# Patient Record
Sex: Male | Born: 1955 | Race: Black or African American | Hispanic: No | Marital: Married | Smoking: Never smoker
Health system: Southern US, Community
[De-identification: ages and names within clinical notes are randomized; demographics above are authoritative.]

## PROBLEM LIST (undated history)

## (undated) DIAGNOSIS — E039 Hypothyroidism, unspecified: Secondary | ICD-10-CM

## (undated) DIAGNOSIS — E2740 Unspecified adrenocortical insufficiency: Secondary | ICD-10-CM

## (undated) DIAGNOSIS — I509 Heart failure, unspecified: Secondary | ICD-10-CM

## (undated) DIAGNOSIS — I10 Essential (primary) hypertension: Secondary | ICD-10-CM

## (undated) DIAGNOSIS — E871 Hypo-osmolality and hyponatremia: Secondary | ICD-10-CM

## (undated) DIAGNOSIS — M25519 Pain in unspecified shoulder: Secondary | ICD-10-CM

## (undated) HISTORY — DX: Pain in unspecified shoulder: M25.519

## (undated) HISTORY — DX: Heart failure, unspecified: I50.9

## (undated) HISTORY — DX: Hypo-osmolality and hyponatremia: E87.1

## (undated) HISTORY — PX: HERNIA REPAIR: SHX51

## (undated) HISTORY — PX: NO PAST SURGERIES: SHX2092

---

## 2003-01-30 HISTORY — PX: HERNIA REPAIR: SHX51

## 2019-09-03 ENCOUNTER — Inpatient Hospital Stay (HOSPITAL_COMMUNITY): Payer: Medicaid Other

## 2019-09-03 ENCOUNTER — Encounter (HOSPITAL_COMMUNITY): Payer: Self-pay | Admitting: *Deleted

## 2019-09-03 ENCOUNTER — Inpatient Hospital Stay (HOSPITAL_COMMUNITY)
Admission: EM | Admit: 2019-09-03 | Discharge: 2019-09-09 | DRG: 291 | Disposition: A | Discharge status: Home / Self Care | Payer: Medicaid Other | Attending: Student in an Organized Health Care Education/Training Program | Admitting: Student in an Organized Health Care Education/Training Program

## 2019-09-03 DIAGNOSIS — R0602 Shortness of breath: Secondary | ICD-10-CM

## 2019-09-03 DIAGNOSIS — Z20822 Contact with and (suspected) exposure to covid-19: Secondary | ICD-10-CM | POA: Diagnosis present

## 2019-09-03 DIAGNOSIS — Z6822 Body mass index (BMI) 22.0-22.9, adult: Secondary | ICD-10-CM | POA: Diagnosis not present

## 2019-09-03 DIAGNOSIS — E878 Other disorders of electrolyte and fluid balance, not elsewhere classified: Secondary | ICD-10-CM | POA: Diagnosis present

## 2019-09-03 DIAGNOSIS — Z79899 Other long term (current) drug therapy: Secondary | ICD-10-CM | POA: Diagnosis not present

## 2019-09-03 DIAGNOSIS — R601 Generalized edema: Secondary | ICD-10-CM | POA: Diagnosis not present

## 2019-09-03 DIAGNOSIS — R634 Abnormal weight loss: Secondary | ICD-10-CM | POA: Diagnosis present

## 2019-09-03 DIAGNOSIS — E778 Other disorders of glycoprotein metabolism: Secondary | ICD-10-CM | POA: Diagnosis present

## 2019-09-03 DIAGNOSIS — E8770 Fluid overload, unspecified: Secondary | ICD-10-CM | POA: Diagnosis present

## 2019-09-03 DIAGNOSIS — M79605 Pain in left leg: Secondary | ICD-10-CM | POA: Diagnosis not present

## 2019-09-03 DIAGNOSIS — E039 Hypothyroidism, unspecified: Secondary | ICD-10-CM | POA: Diagnosis not present

## 2019-09-03 DIAGNOSIS — I361 Nonrheumatic tricuspid (valve) insufficiency: Secondary | ICD-10-CM | POA: Diagnosis not present

## 2019-09-03 DIAGNOSIS — E871 Hypo-osmolality and hyponatremia: Secondary | ICD-10-CM | POA: Diagnosis present

## 2019-09-03 DIAGNOSIS — R001 Bradycardia, unspecified: Secondary | ICD-10-CM | POA: Diagnosis present

## 2019-09-03 DIAGNOSIS — I445 Left posterior fascicular block: Secondary | ICD-10-CM | POA: Diagnosis present

## 2019-09-03 DIAGNOSIS — Z8619 Personal history of other infectious and parasitic diseases: Secondary | ICD-10-CM

## 2019-09-03 DIAGNOSIS — D649 Anemia, unspecified: Secondary | ICD-10-CM | POA: Diagnosis not present

## 2019-09-03 DIAGNOSIS — N5089 Other specified disorders of the male genital organs: Secondary | ICD-10-CM

## 2019-09-03 DIAGNOSIS — E440 Moderate protein-calorie malnutrition: Secondary | ICD-10-CM | POA: Diagnosis present

## 2019-09-03 DIAGNOSIS — E8809 Other disorders of plasma-protein metabolism, not elsewhere classified: Secondary | ICD-10-CM | POA: Diagnosis present

## 2019-09-03 DIAGNOSIS — I5021 Acute systolic (congestive) heart failure: Secondary | ICD-10-CM | POA: Diagnosis present

## 2019-09-03 DIAGNOSIS — N433 Hydrocele, unspecified: Secondary | ICD-10-CM | POA: Diagnosis present

## 2019-09-03 DIAGNOSIS — E038 Other specified hypothyroidism: Secondary | ICD-10-CM | POA: Diagnosis present

## 2019-09-03 DIAGNOSIS — I110 Hypertensive heart disease with heart failure: Secondary | ICD-10-CM | POA: Diagnosis present

## 2019-09-03 DIAGNOSIS — D509 Iron deficiency anemia, unspecified: Secondary | ICD-10-CM | POA: Diagnosis present

## 2019-09-03 DIAGNOSIS — D721 Eosinophilia, unspecified: Secondary | ICD-10-CM | POA: Diagnosis not present

## 2019-09-03 DIAGNOSIS — B181 Chronic viral hepatitis B without delta-agent: Secondary | ICD-10-CM | POA: Diagnosis present

## 2019-09-03 DIAGNOSIS — M6284 Sarcopenia: Secondary | ICD-10-CM | POA: Diagnosis not present

## 2019-09-03 DIAGNOSIS — R609 Edema, unspecified: Secondary | ICD-10-CM

## 2019-09-03 DIAGNOSIS — R531 Weakness: Secondary | ICD-10-CM | POA: Diagnosis not present

## 2019-09-03 HISTORY — DX: Essential (primary) hypertension: I10

## 2019-09-03 HISTORY — DX: Hypothyroidism, unspecified: E03.9

## 2019-09-03 LAB — T4, FREE: Free T4: 0.91 ng/dL (ref 0.61–1.12)

## 2019-09-03 LAB — BASIC METABOLIC PANEL
Anion gap: 8 (ref 5–15)
Anion gap: 6 (ref 5–15)
BUN: <5 mg/dL — ABNORMAL LOW (ref 8–23)
BUN: 5 mg/dL — ABNORMAL LOW (ref 8–23)
CO2: 22 mmol/L (ref 22–32)
CO2: 21 mmol/L — ABNORMAL LOW (ref 22–32)
Calcium: 7.8 mg/dL — ABNORMAL LOW (ref 8.9–10.3)
Calcium: 7.1 mg/dL — ABNORMAL LOW (ref 8.9–10.3)
Chloride: 87 mmol/L — ABNORMAL LOW (ref 98–111)
Chloride: 90 mmol/L — ABNORMAL LOW (ref 98–111)
Creatinine, Ser: 1.06 mg/dL (ref 0.61–1.24)
Creatinine, Ser: 0.98 mg/dL (ref 0.61–1.24)
GFR calc Af Amer: >60 mL/min (ref >60)
GFR calc Af Amer: >60 mL/min (ref >60)
GFR calc non Af Amer: >60 mL/min (ref >60)
GFR calc non Af Amer: >60 mL/min (ref >60)
Glucose, Bld: 79 mg/dL (ref 70–99)
Glucose, Bld: 81 mg/dL (ref 70–99)
Potassium: 3.7 mmol/L (ref 3.5–5.1)
Potassium: 5.4 mmol/L — ABNORMAL HIGH (ref 3.5–5.1)
Sodium: 117 mmol/L — CL (ref 135–145)
Sodium: 117 mmol/L — CL (ref 135–145)

## 2019-09-03 LAB — CK: Total CK: 250 U/L (ref 49–397)

## 2019-09-03 LAB — CBC
HCT: 32.9 % — ABNORMAL LOW (ref 39.0–52.0)
Hemoglobin: 12 g/dL — ABNORMAL LOW (ref 13.0–17.0)
MCH: 25.8 pg — ABNORMAL LOW (ref 26.0–34.0)
MCHC: 36.5 g/dL — ABNORMAL HIGH (ref 30.0–36.0)
MCV: 70.6 fL — ABNORMAL LOW (ref 80.0–100.0)
Platelets: 194 10*3/uL (ref 150–400)
RBC: 4.66 MIL/uL (ref 4.22–5.81)
RDW: 15.8 % — ABNORMAL HIGH (ref 11.5–15.5)
WBC: 4.6 10*3/uL (ref 4.0–10.5)
nRBC: 0 % (ref 0.0–0.2)

## 2019-09-03 LAB — HEPATIC FUNCTION PANEL
ALT: 15 U/L (ref 0–44)
AST: 31 U/L (ref 15–41)
Albumin: 2 g/dL — ABNORMAL LOW (ref 3.5–5.0)
Alkaline Phosphatase: 72 U/L (ref 38–126)
Bilirubin, Direct: 0.3 mg/dL — ABNORMAL HIGH (ref 0.0–0.2)
Indirect Bilirubin: 0.4 mg/dL (ref 0.3–0.9)
Total Bilirubin: 0.7 mg/dL (ref 0.3–1.2)
Total Protein: 5.3 g/dL — ABNORMAL LOW (ref 6.5–8.1)

## 2019-09-03 LAB — URINALYSIS, ROUTINE W REFLEX MICROSCOPIC
Bilirubin Urine: NEGATIVE
Glucose, UA: NEGATIVE mg/dL
Hgb urine dipstick: NEGATIVE
Ketones, ur: NEGATIVE mg/dL
Leukocytes,Ua: NEGATIVE
Nitrite: NEGATIVE
Protein, ur: NEGATIVE mg/dL
Specific Gravity, Urine: 1.012 (ref 1.005–1.030)
pH: 6 (ref 5.0–8.0)

## 2019-09-03 LAB — PROTEIN / CREATININE RATIO, URINE
Creatinine, Urine: 130.95 mg/dL
Protein Creatinine Ratio: 0.11 mg/mg{Cre} (ref 0.00–0.15)
Total Protein, Urine: 14 mg/dL

## 2019-09-03 LAB — MAGNESIUM: Magnesium: 2 mg/dL (ref 1.7–2.4)

## 2019-09-03 LAB — SODIUM, URINE, RANDOM: Sodium, Ur: 26 mmol/L

## 2019-09-03 LAB — HEMOGLOBIN A1C
Hgb A1c MFr Bld: 4.9 % (ref 4.8–5.6)
Mean Plasma Glucose: 93.93 mg/dL

## 2019-09-03 LAB — TSH: TSH: 9.412 u[IU]/mL — ABNORMAL HIGH (ref 0.350–4.500)

## 2019-09-03 LAB — HIV ANTIBODY (ROUTINE TESTING W REFLEX): HIV Screen 4th Generation wRfx: NONREACTIVE

## 2019-09-03 LAB — OSMOLALITY, URINE: Osmolality, Ur: 284 mOsm/kg — ABNORMAL LOW (ref 300–900)

## 2019-09-03 LAB — LIPASE, BLOOD: Lipase: 16 U/L (ref 11–51)

## 2019-09-03 LAB — OSMOLALITY: Osmolality: 245 mOsm/kg — CL (ref 275–295)

## 2019-09-03 LAB — TROPONIN I (HIGH SENSITIVITY): Troponin I (High Sensitivity): 3 ng/L (ref <18)

## 2019-09-03 LAB — PROTIME-INR
INR: 1.4 — ABNORMAL HIGH (ref 0.8–1.2)
Prothrombin Time: 16.5 seconds — ABNORMAL HIGH (ref 11.4–15.2)

## 2019-09-03 LAB — SARS CORONAVIRUS 2 BY RT PCR (HOSPITAL ORDER, PERFORMED IN ~~LOC~~ HOSPITAL LAB): SARS Coronavirus 2: NEGATIVE

## 2019-09-03 IMAGING — US US RENAL
1 series · 14 of 25 positions shown · non-contrast
Comparison: None.

CLINICAL DATA: Anasarca.

EXAM:
RENAL / URINARY TRACT ULTRASOUND COMPLETE

[Series 1: us renal · 14 of 37 slices shown]
[im 1/37]
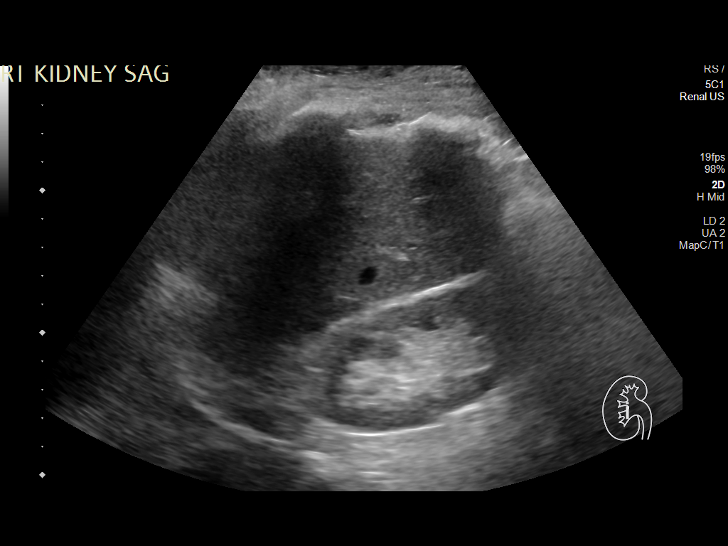
[im 4/37]
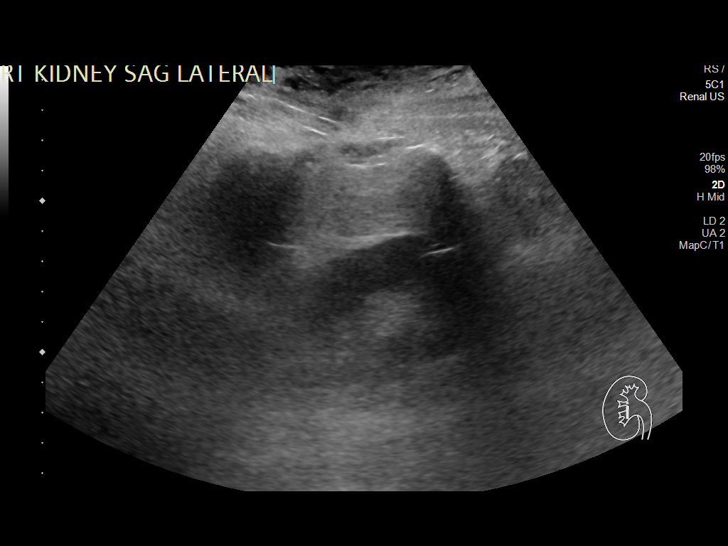
[im 7/37]
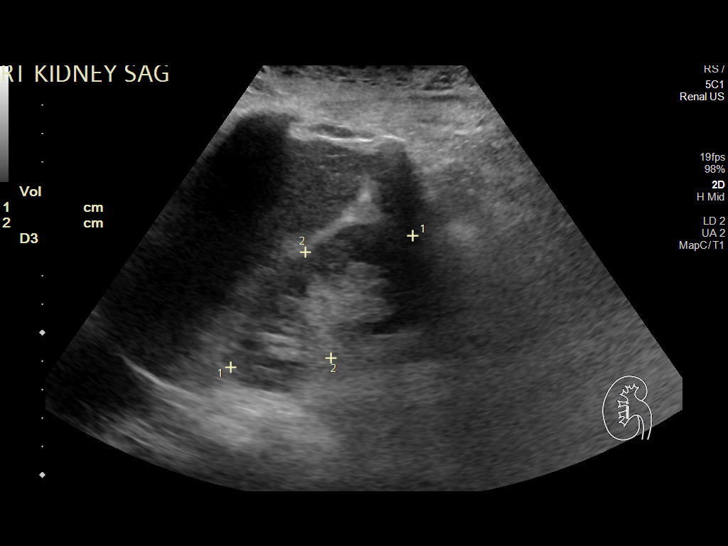
[im 10/37]
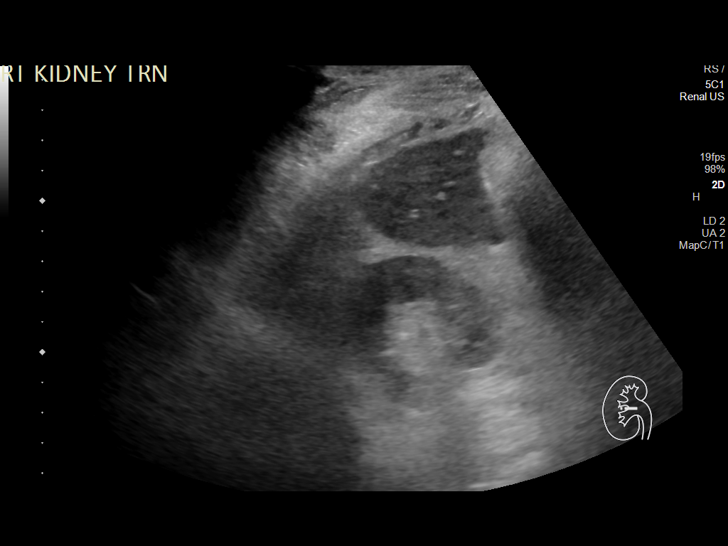
[im 13/37]
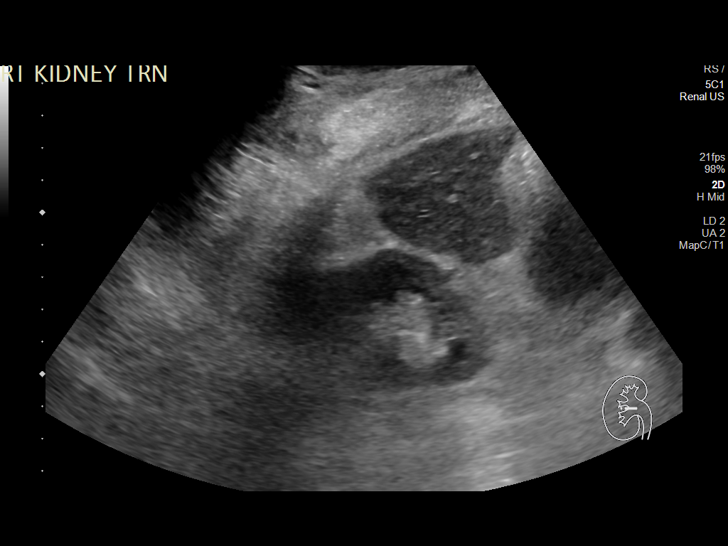
[im 14/37]
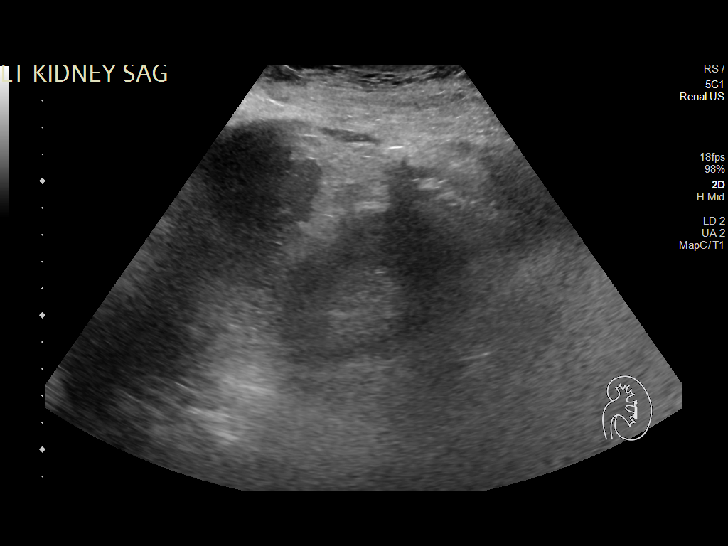
[im 17/37]
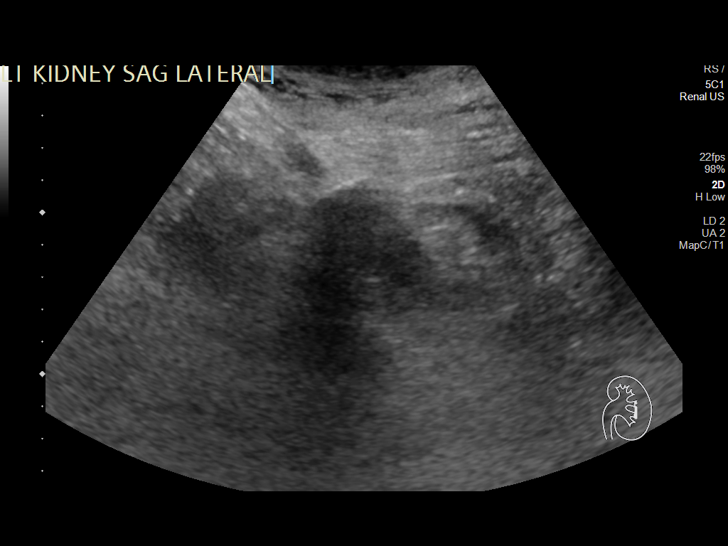
[im 20/37]
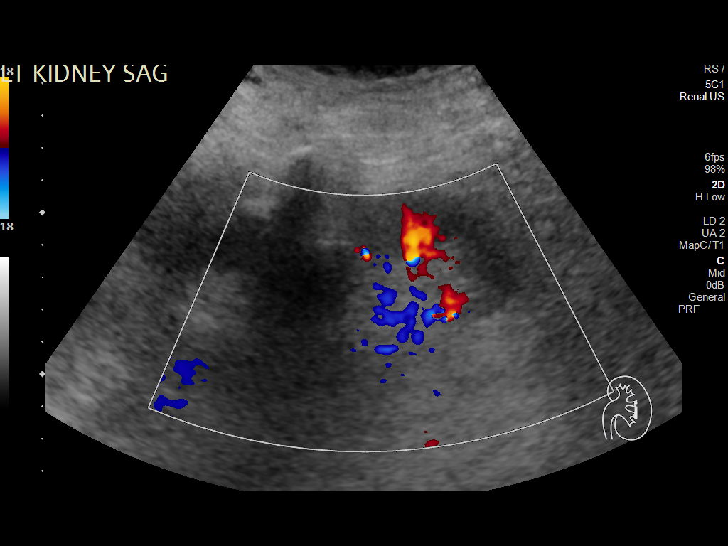
[im 23/37]
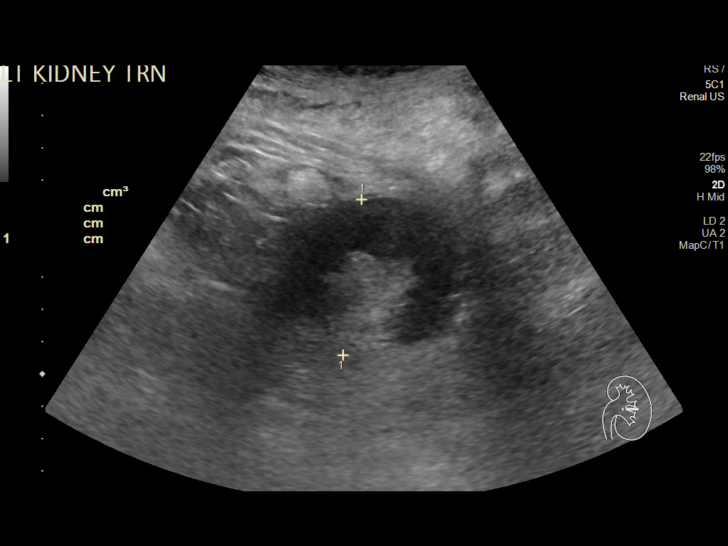
[im 25/37]
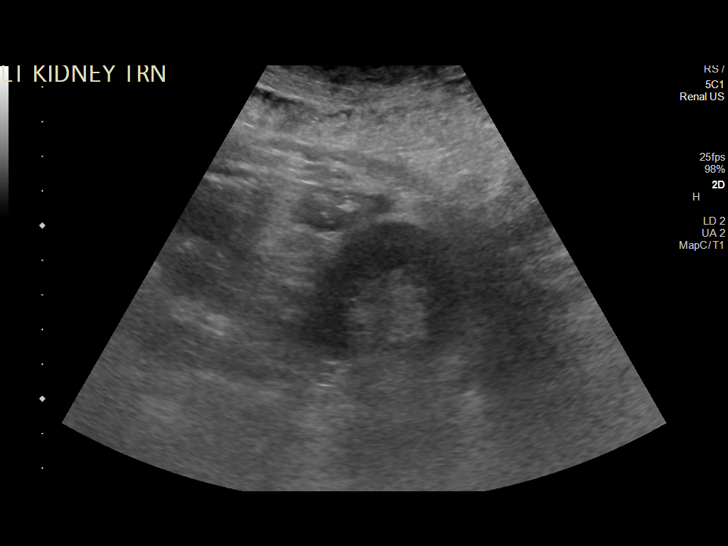
[im 28/37]
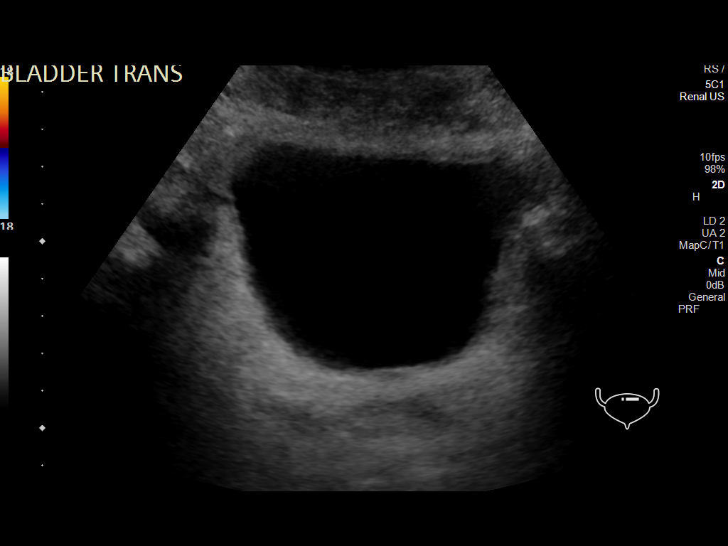
[im 31/37]
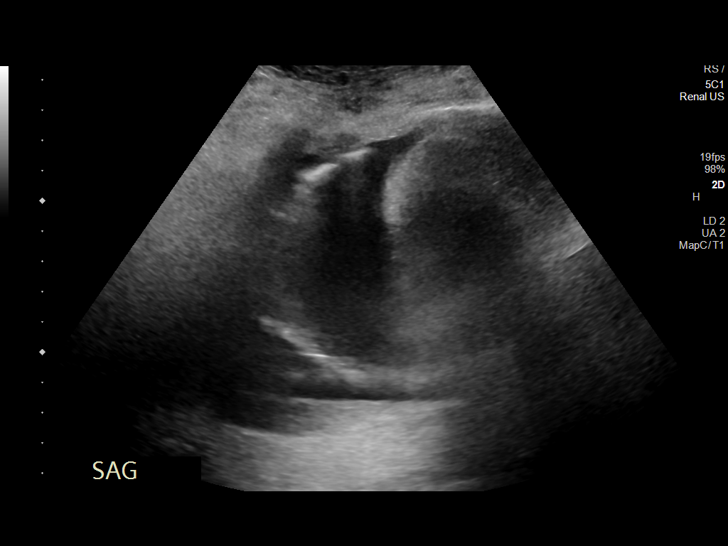
[im 34/37]
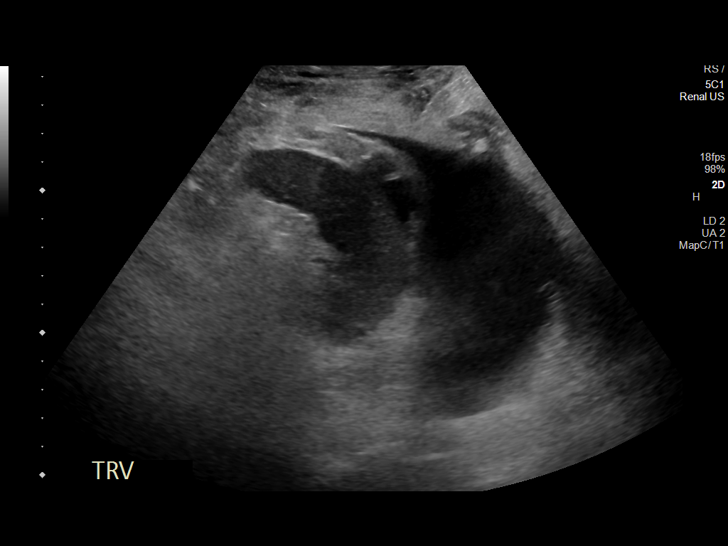
[im 37/37]
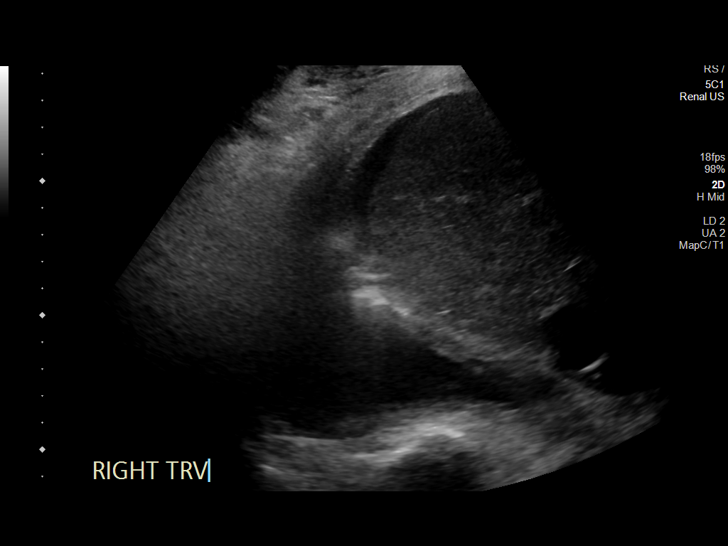

[14 of 25 positions shown; findings below may reference images not displayed]

FINDINGS: Right Kidney:

Renal measurements: 7.9 x 3.8 x 7.9 cm. = volume: 74 mL .
Echogenicity within normal limits. No mass or hydronephrosis
visualized.

Left Kidney:

Renal measurements: 8.9 x 4.4 x 4.9 cm = volume: 111 mL.
Echogenicity within normal limits. No mass or hydronephrosis
visualized.

Bladder:

Neither ureteral jet was visualized.

Other:

There are bilateral pleural effusions. Subcutaneous soft tissue
edema is noted.
IMPRESSION: 1. No hydronephrosis.
2. Somewhat small kidneys bilaterally.
3. Bilateral pleural effusions.
4. Subcutaneous edema consistent with the reported history of
anasarca.

## 2019-09-03 MED ORDER — RIVAROXABAN 10 MG PO TABS
10.0000 mg | ORAL_TABLET | Freq: Every day | ORAL | Status: DC
  Administered 2019-09-04 – 2019-09-09 (×6): 10 mg via ORAL
  Filled 2019-09-03 (×6): qty 1

## 2019-09-03 MED ORDER — FUROSEMIDE 10 MG/ML IJ SOLN
40.0000 mg | Freq: Once | INTRAMUSCULAR | Status: AC
  Administered 2019-09-03: 40 mg via INTRAVENOUS
  Filled 2019-09-03: qty 4

## 2019-09-03 MED ORDER — SODIUM CHLORIDE 0.9 % IV BOLUS
1000.0000 mL | Freq: Once | INTRAVENOUS | Status: AC
  Administered 2019-09-03: 1000 mL via INTRAVENOUS

## 2019-09-03 MED ORDER — SODIUM CHLORIDE 0.9% FLUSH
3.0000 mL | Freq: Once | INTRAVENOUS | Status: AC
  Administered 2019-09-04: 3 mL via INTRAVENOUS
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
## 2019-09-03 NOTE — ED Triage Notes (Signed)
To ED for eval of weakness for past week. States pain in legs and abd. Noted to have HR in 40's. Pt denies sob and denies cp. States he is sleeping fine, just weak. Initial complaint on arrival was not urinating. Pt denies this. Eyes appear swollen - pt states this is wnl for him.

## 2019-09-03 NOTE — ED Provider Notes (Signed)
[facility] EMERGENCY DEPARTMENT Provider Note   CSN: [id number] Arrival date & time: 09/03/19  1007     History Chief Complaint  Patient presents with  . Weakness    Steven Frazier is a 64 y.o. male.  The history is provided by the patient and medical records. No language interpreter was used.  Illness Location:  Generalized weakness and fatigue Quality:  Diffuse Severity:  Severe Onset quality:  Gradual Duration:  1 week Timing:  Constant Progression:  Worsening Chronicity:  New Associated symptoms: fatigue and nausea   Associated symptoms: no abdominal pain, no chest pain, no congestion, no cough, no diarrhea, no fever, no headaches, no loss of consciousness, no rash, no shortness of breath, no vomiting and no wheezing   Associated symptoms comment:  Edema        Past Medical History:  Diagnosis Date  . Hypertension     There are no problems to display for this patient.   History reviewed. No pertinent surgical history.     No family history on file.  Social History   Tobacco Use  . Smoking status: Not on file  Substance Use Topics  . Alcohol use: Not on file  . Drug use: Not on file    Home Medications Prior to Admission medications   Not on File    Allergies    Patient has no known allergies.  Review of Systems   Review of Systems  Constitutional: Positive for fatigue. Negative for chills, diaphoresis and fever.  HENT: Positive for facial swelling. Negative for congestion.   Eyes: Negative for visual disturbance.  Respiratory: Negative for cough, chest tightness, shortness of breath and wheezing.   Cardiovascular: Positive for leg swelling. Negative for chest pain and palpitations.  Gastrointestinal: Positive for nausea. Negative for abdominal pain, constipation, diarrhea and vomiting.  Genitourinary: Positive for decreased urine volume. Negative for dysuria and flank pain.  Musculoskeletal: Negative for back pain, neck  pain and neck stiffness.  Skin: Negative for rash and wound.  Neurological: Positive for light-headedness. Negative for dizziness, seizures, loss of consciousness, speech difficulty, weakness, numbness and headaches.  Psychiatric/Behavioral: Negative for agitation and confusion.  All other systems reviewed and are negative.   Physical Exam Updated Vital Signs BP 101/68 (BP Location: Right Arm)   Pulse (!) 47   Temp 97.6 F (36.4 C) (Oral)   Resp 14   SpO2 100%   Physical Exam Vitals and nursing note reviewed.  Constitutional:      General: He is not in acute distress.    Appearance: He is well-developed. He is ill-appearing. He is not toxic-appearing or diaphoretic.  HENT:     Head: Atraumatic.     Comments: Edematous     Nose: Nose normal. No congestion.     Mouth/Throat:     Mouth: Mucous membranes are moist.     Pharynx: No oropharyngeal exudate or posterior oropharyngeal erythema.  Eyes:     Extraocular Movements: Extraocular movements intact.     Conjunctiva/sclera: Conjunctivae normal.     Pupils: Pupils are equal, round, and reactive to light.  Cardiovascular:     Rate and Rhythm: Regular rhythm. Bradycardia present.     Heart sounds: No murmur heard.   Pulmonary:     Effort: Pulmonary effort is normal. No respiratory distress.     Breath sounds: Normal breath sounds. No wheezing, rhonchi or rales.  Chest:     Chest wall: No tenderness.  Abdominal:  General: Abdomen is flat.     Palpations: Abdomen is soft.     Tenderness: There is no abdominal tenderness. There is no right CVA tenderness, left CVA tenderness, guarding or rebound.  Musculoskeletal:        General: No tenderness or signs of injury.     Cervical back: Neck supple. No tenderness.     Right lower leg: Edema present.     Left lower leg: Edema present.  Skin:    General: Skin is warm and dry.     Capillary Refill: Capillary refill takes less than 2 seconds.     Findings: No erythema.    Neurological:     General: No focal deficit present.     Mental Status: He is alert.  Psychiatric:        Mood and Affect: Mood normal.     ED Results / Procedures / Treatments   Labs (all labs ordered are listed, but only abnormal results are displayed) Labs Reviewed  BASIC METABOLIC PANEL - Abnormal; Notable for the following components:      Result Value   Sodium 117 (*)    Chloride 87 (*)    BUN <5 (*)    Calcium 7.8 (*)    All other components within normal limits  CBC - Abnormal; Notable for the following components:   Hemoglobin 12.0 (*)    HCT 32.9 (*)    MCV 70.6 (*)    MCH 25.8 (*)    MCHC 36.5 (*)    RDW 15.8 (*)    All other components within normal limits  TSH - Abnormal; Notable for the following components:   TSH 9.412 (*)    All other components within normal limits  HEPATIC FUNCTION PANEL - Abnormal; Notable for the following components:   Total Protein 5.3 (*)    Albumin 2.0 (*)    Bilirubin, Direct 0.3 (*)    All other components within normal limits  OSMOLALITY - Abnormal; Notable for the following components:   Osmolality 245 (*)    All other components within normal limits  SARS CORONAVIRUS 2 BY RT PCR (HOSPITAL ORDER, PERFORMED IN [facility])  MAGNESIUM  LIPASE, BLOOD  URINALYSIS, ROUTINE W REFLEX MICROSCOPIC  SODIUM, URINE, RANDOM  OSMOLALITY, URINE  CK  T4, FREE  T3  TROPONIN I (HIGH SENSITIVITY)    EKG EKG Interpretation  Date/Time:  Thursday September 03 2019 10:20:31 EDT Ventricular Rate:  48 PR Interval:  382 QRS Duration: 90 QT Interval:  462 QTC Calculation: 412 R Axis:   111 Text Interpretation: Sinus bradycardia with 1st degree A-V block Left posterior fascicular block ST & T wave abnormality, consider inferolateral ischemia Abnormal ECG No prior ECG for comparison. No STEMI Confirmed by Theda Belfast ([id number]) on 09/03/2019 12:25:16 PM   Radiology No results found.  Procedures Procedures (including critical  care time)  CRITICAL CARE Performed by: Canary Brim Jaliah Foody Total critical care time: 35 minutes Critical care time was exclusive of separately billable procedures and treating other patients. Critical care was necessary to treat or prevent imminent or life-threatening deterioration. Critical care was time spent personally by me on the following activities: development of treatment plan with patient and/or surrogate as well as nursing, discussions with consultants, evaluation of patient's response to treatment, examination of patient, obtaining history from patient or surrogate, ordering and performing treatments and interventions, ordering and review of laboratory studies, ordering and review of radiographic studies, pulse oximetry and re-evaluation of patient's  condition.   Medications Ordered in ED Medications  sodium chloride flush (NS) 0.9 % injection 3 mL (has no administration in time range)  sodium chloride 0.9 % bolus 1,000 mL (1,000 mLs Intravenous Bolus from Bag 09/03/19 1309)    ED Course  I have reviewed the triage vital signs and the nursing notes.  Pertinent labs & imaging results that were available during my care of the patient were reviewed by me and considered in my medical decision making (see chart for details).    MDM Rules/Calculators/A&P                          Jonus Coble is a 64 y.o. male with a past medical history significant of hypertension who presents with generalized weakness, fatigue, and peripheral edema worsening over the last few days.  Patient reports that for the last week he has been feeling bad and is gradually getting more more fatigued.  He reports he is never had this before.  He denies any pain including no headache, neck pain, neck stiffness.  He denies any chest pain, palpitations, or shortness of breath.  He does report some nausea but no vomiting.  Denies any urinary or GI symptoms.  He says that he has been having some edema diffusely  including his face and his extremities.  He says this is new.  He denies any medication changes.  Denies alcohol abuse.  On exam, patient is edematous.  Patient has edema around his eyes and face.  The lungs are clear and chest is nontender.  Abdomen is nontender.  He has edema in extremities.  Good pulses present.  No murmur.  Normal bowel sounds.  Patient has a blood pressure in 100 systolic but he is bradycardic.  EKG shows a sinus bradycardia with no STEMI.  Patient has screening labs in triage which revealed a hyponatremia with a sodium of 117.  He is also hypochloremic and hypocalcemic.  Unclear etiology of this.  He is not hyperglycemic.  I suspect his hyponatremia is contributing to the peripheral edema and his fatigue with the generalized weakness.  He was not weak on my exam focally.  He will be given some fluids and have other screening work-up.  Will be called for admission for severe hyponatremia when other work-up has been completed.  We will get a Covid test although he denies any infectious type symptoms.  Given his lack of focal neurologic deficits, headache, vision changes, or altered mental status, do not feel he needs head CT at this time.  Doubt cerebral edema currently.   Anticipate admission.  3:05 PM Work-up again to return.  Patient had slightly elevated of TSH.  Magnesium was normal.  Liver function is unremarkable.  Osmolality was ordered and was low.  Patient was started on 1 L of fluids to gently help with his sodium and not cause demyelination.  EKG showed sinus bradycardia.  Patient will be admitted to medicine service for further work-up and management.  Still unclear etiology of the patient's hyponatremia as he does not know what blood pressure medicine he is on and has never had this before.  Medicine will admit for further work-up and management of hyponatremia causing fatigue, lightheadedness, and peripheral edema.   Final Clinical Impression(s) / ED  Diagnoses Final diagnoses:  Hyponatremia  Bradycardia  Peripheral edema    Clinical Impression: 1. Hyponatremia   2. Bradycardia   3. Peripheral edema     Disposition:  Admit  This note was prepared with assistance of Dragon voice recognition software. Occasional wrong-word or sound-a-like substitutions may have occurred due to the inherent limitations of voice recognition software.     Kalany Diekmann, Canary Brim, MD 09/03/19 [phone]

## 2019-09-03 NOTE — Hospital Course (Addendum)
Hospital Course by Problem List: Hypervolemic Hypotonic Hyponatremia likely 2/2 acute systolic heart failure. Presented with weakness for 2 weeks and history of HTN. Found to have severe hyponatremia with Na at 117 on admission with signs of hypervolemia, anasarca and sarcopenia. HTN home medications were held. Placed on free water restriction and started on IV lasix for diuresis. Nephrotic syndrome work up was negative. 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 Cumulative net (-) output of 11.7 L and reach euvolemic state. Unfortunately, unclear etiology of this sudden HFmrEF. No history reported by family or PCP. Request sent in for records from [facility] in [place], [place]. Once received can forward those to PCP. Team transitioned down to oral lasix 20 mg daily. Restart lisinopril 5 mg once daily. Holding HCTZ and metroprolol, recommend follow up with PCP for HTN medication changes.    Unintentional weight loss. Documented unintentional weight loss of 27 lbs per PCP. Panel for inflammatory & rheumatologic disease involvement was unrevealing. No obvious malignancy. Serum protein electrophoresis c/w malnutrition. Long-standing history as vegetarian, unclear etiology of weight loss. Awaiting immunofixation electrophoresis and can forward to PCP.    Eosinophilia. CBC showed eosinophilia of unclear cause, awaiting parasitic labs to rule out reactivated parasite given his immigration status from [place]. Will send to PCP.    Hydrocele. 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 Diuresed well and placed on scrotal support + Tyelenol PRN. Recommended home health PT and use of a rolling walker along with supervision.   LLE weakness. Suspecting to be from deconditioning or from malnutrition. Will f/u with home health PT, pending  insurance. Consider imaging if symptoms worsen outpatient.    Hypoalbuminemia. Suspect this to be in setting of low protein intake from malnutrition given serum protein electrophoresis findings. Hepatitis panel c/w immunity to a natural infection of hepatitis B. Monitor for bleeds outpatient    Microcytic anemia. Labs c/w microcytic anemia etiology. Iron studies WNL but isolated low TIBC. Vit. B12 WNL. Not c/w AoCD, suspecting thalassemia. Continue oral folate 1 mg oral daily outpatient and awaiting Hb fractionation. Team will communicate results to PCP.

## 2019-09-03 NOTE — ED Notes (Signed)
24hr urine collection started.

## 2019-09-03 NOTE — ED Notes (Signed)
MD notified of serum osmolality of 245.

## 2019-09-04 ENCOUNTER — Inpatient Hospital Stay (HOSPITAL_COMMUNITY): Payer: Medicaid Other

## 2019-09-04 ENCOUNTER — Other Ambulatory Visit: Payer: Self-pay

## 2019-09-04 DIAGNOSIS — I5021 Acute systolic (congestive) heart failure: Secondary | ICD-10-CM

## 2019-09-04 DIAGNOSIS — I361 Nonrheumatic tricuspid (valve) insufficiency: Secondary | ICD-10-CM

## 2019-09-04 DIAGNOSIS — R601 Generalized edema: Secondary | ICD-10-CM | POA: Diagnosis present

## 2019-09-04 DIAGNOSIS — I110 Hypertensive heart disease with heart failure: Principal | ICD-10-CM

## 2019-09-04 DIAGNOSIS — D509 Iron deficiency anemia, unspecified: Secondary | ICD-10-CM

## 2019-09-04 LAB — LIPID PANEL
Cholesterol: 102 mg/dL (ref 0–200)
HDL: 41 mg/dL (ref >40)
LDL Cholesterol: 54 mg/dL (ref 0–99)
Total CHOL/HDL Ratio: 2.5 RATIO
Triglycerides: 37 mg/dL (ref <150)
VLDL: 7 mg/dL (ref 0–40)

## 2019-09-04 LAB — FERRITIN: Ferritin: 123 ng/mL (ref 24–336)

## 2019-09-04 LAB — BASIC METABOLIC PANEL
Anion gap: 7 (ref 5–15)
Anion gap: 9 (ref 5–15)
Anion gap: 7 (ref 5–15)
Anion gap: 8 (ref 5–15)
Anion gap: 7 (ref 5–15)
BUN: <5 mg/dL — ABNORMAL LOW (ref 8–23)
BUN: <5 mg/dL — ABNORMAL LOW (ref 8–23)
BUN: <5 mg/dL — ABNORMAL LOW (ref 8–23)
BUN: <5 mg/dL — ABNORMAL LOW (ref 8–23)
BUN: <5 mg/dL — ABNORMAL LOW (ref 8–23)
CO2: 22 mmol/L (ref 22–32)
CO2: 21 mmol/L — ABNORMAL LOW (ref 22–32)
CO2: 24 mmol/L (ref 22–32)
CO2: 24 mmol/L (ref 22–32)
CO2: 25 mmol/L (ref 22–32)
Calcium: 7.8 mg/dL — ABNORMAL LOW (ref 8.9–10.3)
Calcium: 7.3 mg/dL — ABNORMAL LOW (ref 8.9–10.3)
Calcium: 7.8 mg/dL — ABNORMAL LOW (ref 8.9–10.3)
Calcium: 7.7 mg/dL — ABNORMAL LOW (ref 8.9–10.3)
Calcium: 7.7 mg/dL — ABNORMAL LOW (ref 8.9–10.3)
Chloride: 90 mmol/L — ABNORMAL LOW (ref 98–111)
Chloride: 91 mmol/L — ABNORMAL LOW (ref 98–111)
Chloride: 91 mmol/L — ABNORMAL LOW (ref 98–111)
Chloride: 89 mmol/L — ABNORMAL LOW (ref 98–111)
Chloride: 89 mmol/L — ABNORMAL LOW (ref 98–111)
Creatinine, Ser: 0.96 mg/dL (ref 0.61–1.24)
Creatinine, Ser: 0.88 mg/dL (ref 0.61–1.24)
Creatinine, Ser: 0.97 mg/dL (ref 0.61–1.24)
Creatinine, Ser: 0.95 mg/dL (ref 0.61–1.24)
Creatinine, Ser: 0.87 mg/dL (ref 0.61–1.24)
GFR calc Af Amer: >60 mL/min (ref >60)
GFR calc Af Amer: >60 mL/min (ref >60)
GFR calc Af Amer: >60 mL/min (ref >60)
GFR calc Af Amer: >60 mL/min (ref >60)
GFR calc Af Amer: >60 mL/min (ref >60)
GFR calc non Af Amer: >60 mL/min (ref >60)
GFR calc non Af Amer: >60 mL/min (ref >60)
GFR calc non Af Amer: >60 mL/min (ref >60)
GFR calc non Af Amer: >60 mL/min (ref >60)
GFR calc non Af Amer: >60 mL/min (ref >60)
Glucose, Bld: 77 mg/dL (ref 70–99)
Glucose, Bld: 63 mg/dL — ABNORMAL LOW (ref 70–99)
Glucose, Bld: 104 mg/dL — ABNORMAL HIGH (ref 70–99)
Glucose, Bld: 127 mg/dL — ABNORMAL HIGH (ref 70–99)
Glucose, Bld: 99 mg/dL (ref 70–99)
Potassium: 3.7 mmol/L (ref 3.5–5.1)
Potassium: 3.4 mmol/L — ABNORMAL LOW (ref 3.5–5.1)
Potassium: 3.5 mmol/L (ref 3.5–5.1)
Potassium: 3.6 mmol/L (ref 3.5–5.1)
Potassium: 3.7 mmol/L (ref 3.5–5.1)
Sodium: 119 mmol/L — CL (ref 135–145)
Sodium: 121 mmol/L — ABNORMAL LOW (ref 135–145)
Sodium: 122 mmol/L — ABNORMAL LOW (ref 135–145)
Sodium: 121 mmol/L — ABNORMAL LOW (ref 135–145)
Sodium: 121 mmol/L — ABNORMAL LOW (ref 135–145)

## 2019-09-04 LAB — URINALYSIS, ROUTINE W REFLEX MICROSCOPIC
Bilirubin Urine: NEGATIVE
Glucose, UA: NEGATIVE mg/dL
Hgb urine dipstick: NEGATIVE
Ketones, ur: NEGATIVE mg/dL
Leukocytes,Ua: NEGATIVE
Nitrite: NEGATIVE
Protein, ur: NEGATIVE mg/dL
Specific Gravity, Urine: 1.004 — ABNORMAL LOW (ref 1.005–1.030)
pH: 5 (ref 5.0–8.0)

## 2019-09-04 LAB — IRON AND TIBC
Iron: 54 ug/dL (ref 45–182)
Saturation Ratios: 25 % (ref 17.9–39.5)
TIBC: 218 ug/dL — ABNORMAL LOW (ref 250–450)
UIBC: 164 ug/dL

## 2019-09-04 LAB — CBC
HCT: 29.3 % — ABNORMAL LOW (ref 39.0–52.0)
Hemoglobin: 10.5 g/dL — ABNORMAL LOW (ref 13.0–17.0)
MCH: 25.4 pg — ABNORMAL LOW (ref 26.0–34.0)
MCHC: 35.8 g/dL (ref 30.0–36.0)
MCV: 70.8 fL — ABNORMAL LOW (ref 80.0–100.0)
Platelets: 153 10*3/uL (ref 150–400)
RBC: 4.14 MIL/uL — ABNORMAL LOW (ref 4.22–5.81)
RDW: 15.4 % (ref 11.5–15.5)
WBC: 4.3 10*3/uL (ref 4.0–10.5)
nRBC: 0 % (ref 0.0–0.2)

## 2019-09-04 LAB — PROTEIN, URINE, 24 HOUR
Collection Interval-UPROT: 23 hours
Protein, Urine: <6 mg/dL
Urine Total Volume-UPROT: 2500 mL

## 2019-09-04 LAB — PROTEIN / CREATININE RATIO, URINE
Creatinine, Urine: 30.32 mg/dL
Total Protein, Urine: <6 mg/dL

## 2019-09-04 LAB — VITAMIN B12: Vitamin B-12: 921 pg/mL — ABNORMAL HIGH (ref 180–914)

## 2019-09-04 LAB — HEPATITIS C ANTIBODY: HCV Ab: NONREACTIVE

## 2019-09-04 LAB — ECHOCARDIOGRAM COMPLETE
Area-P 1/2: 2.39 cm2
S' Lateral: 3.7 cm

## 2019-09-04 LAB — T3: T3, Total: 96 ng/dL (ref 71–180)

## 2019-09-04 LAB — HEPATITIS B SURFACE ANTIGEN: Hepatitis B Surface Ag: NONREACTIVE

## 2019-09-04 IMAGING — DX DG CHEST 2V
2 series · 2 of 2 positions shown · non-contrast
Comparison: No prior.

CLINICAL DATA: Shortness of breath.

EXAM:
CHEST - 2 VIEW

[chest lat]
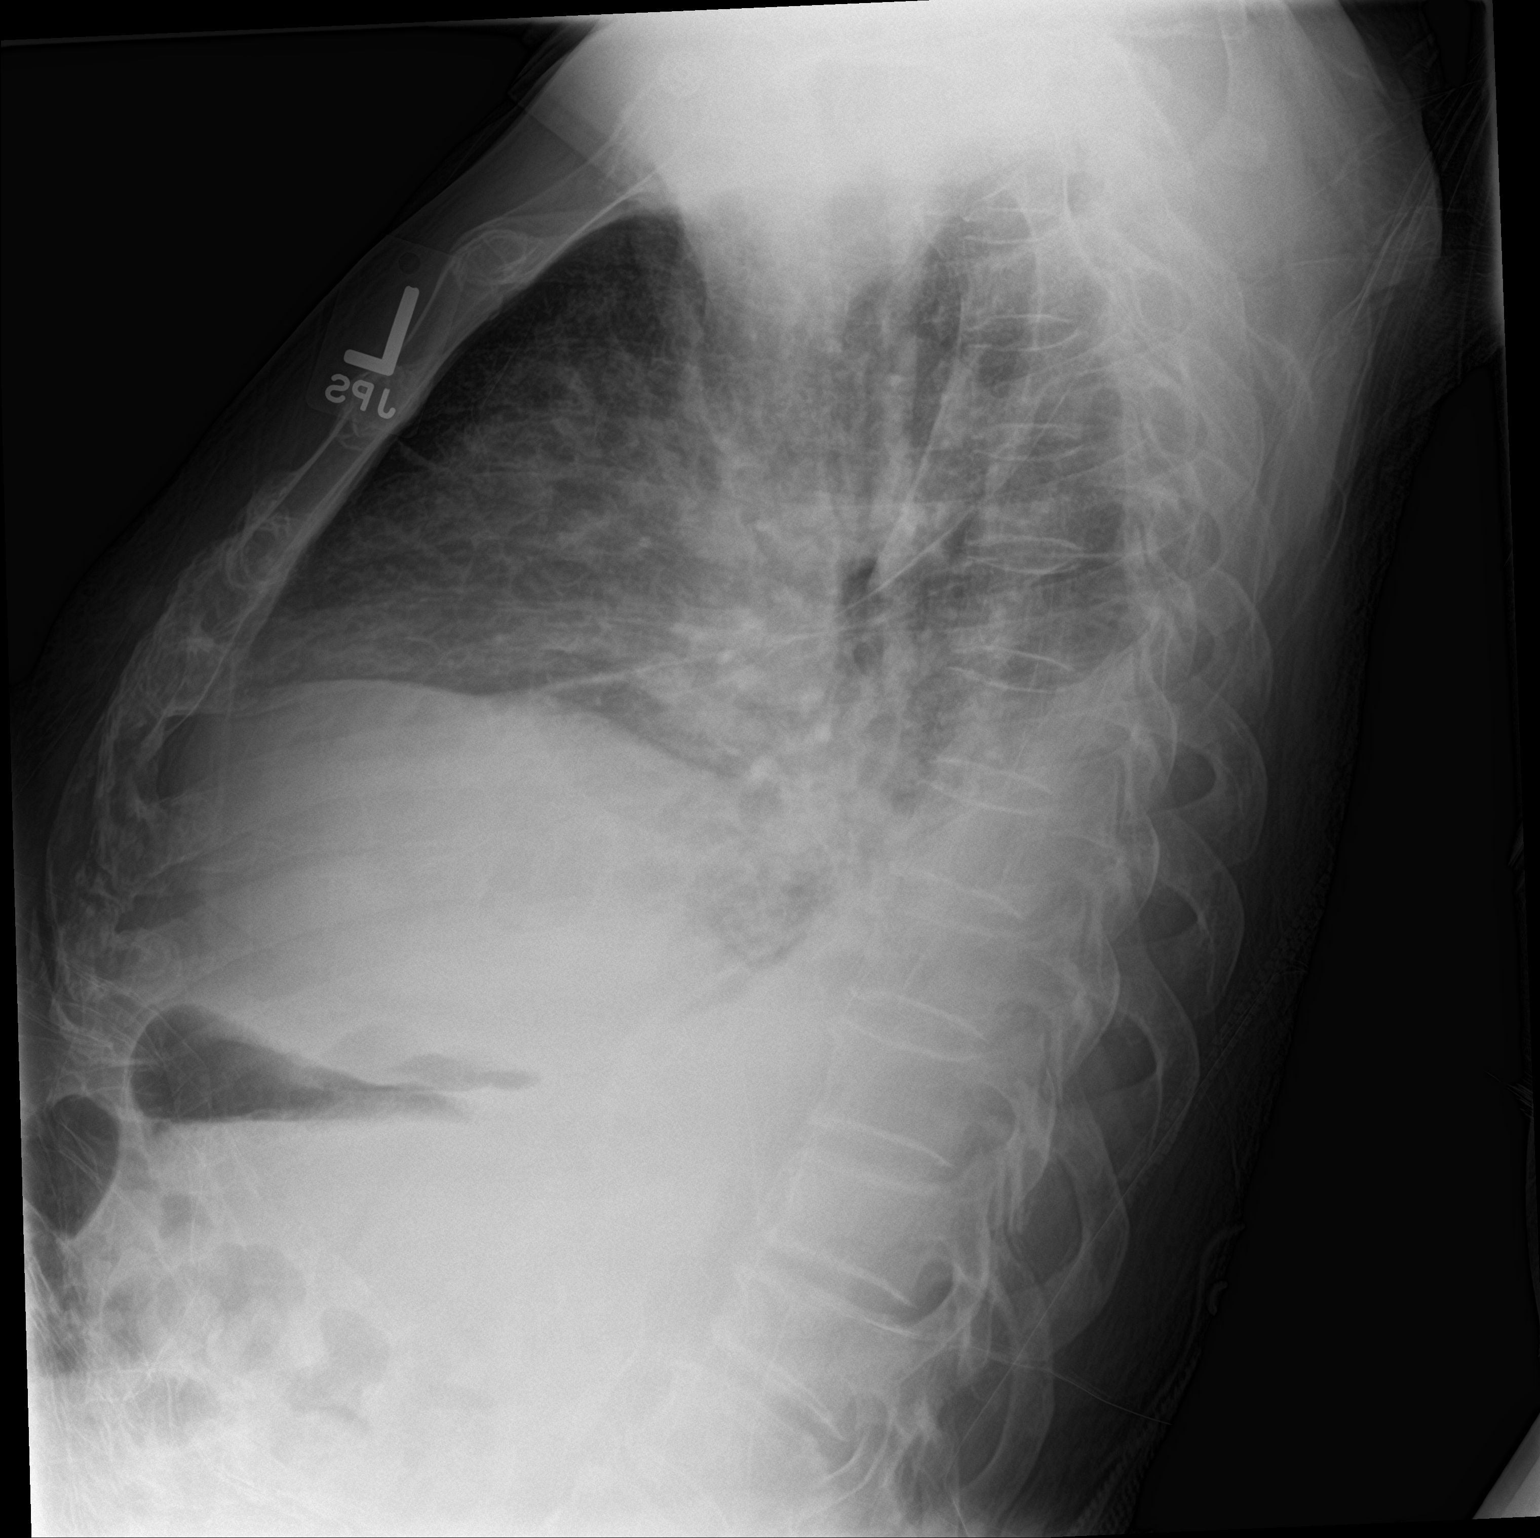

[chest ap]
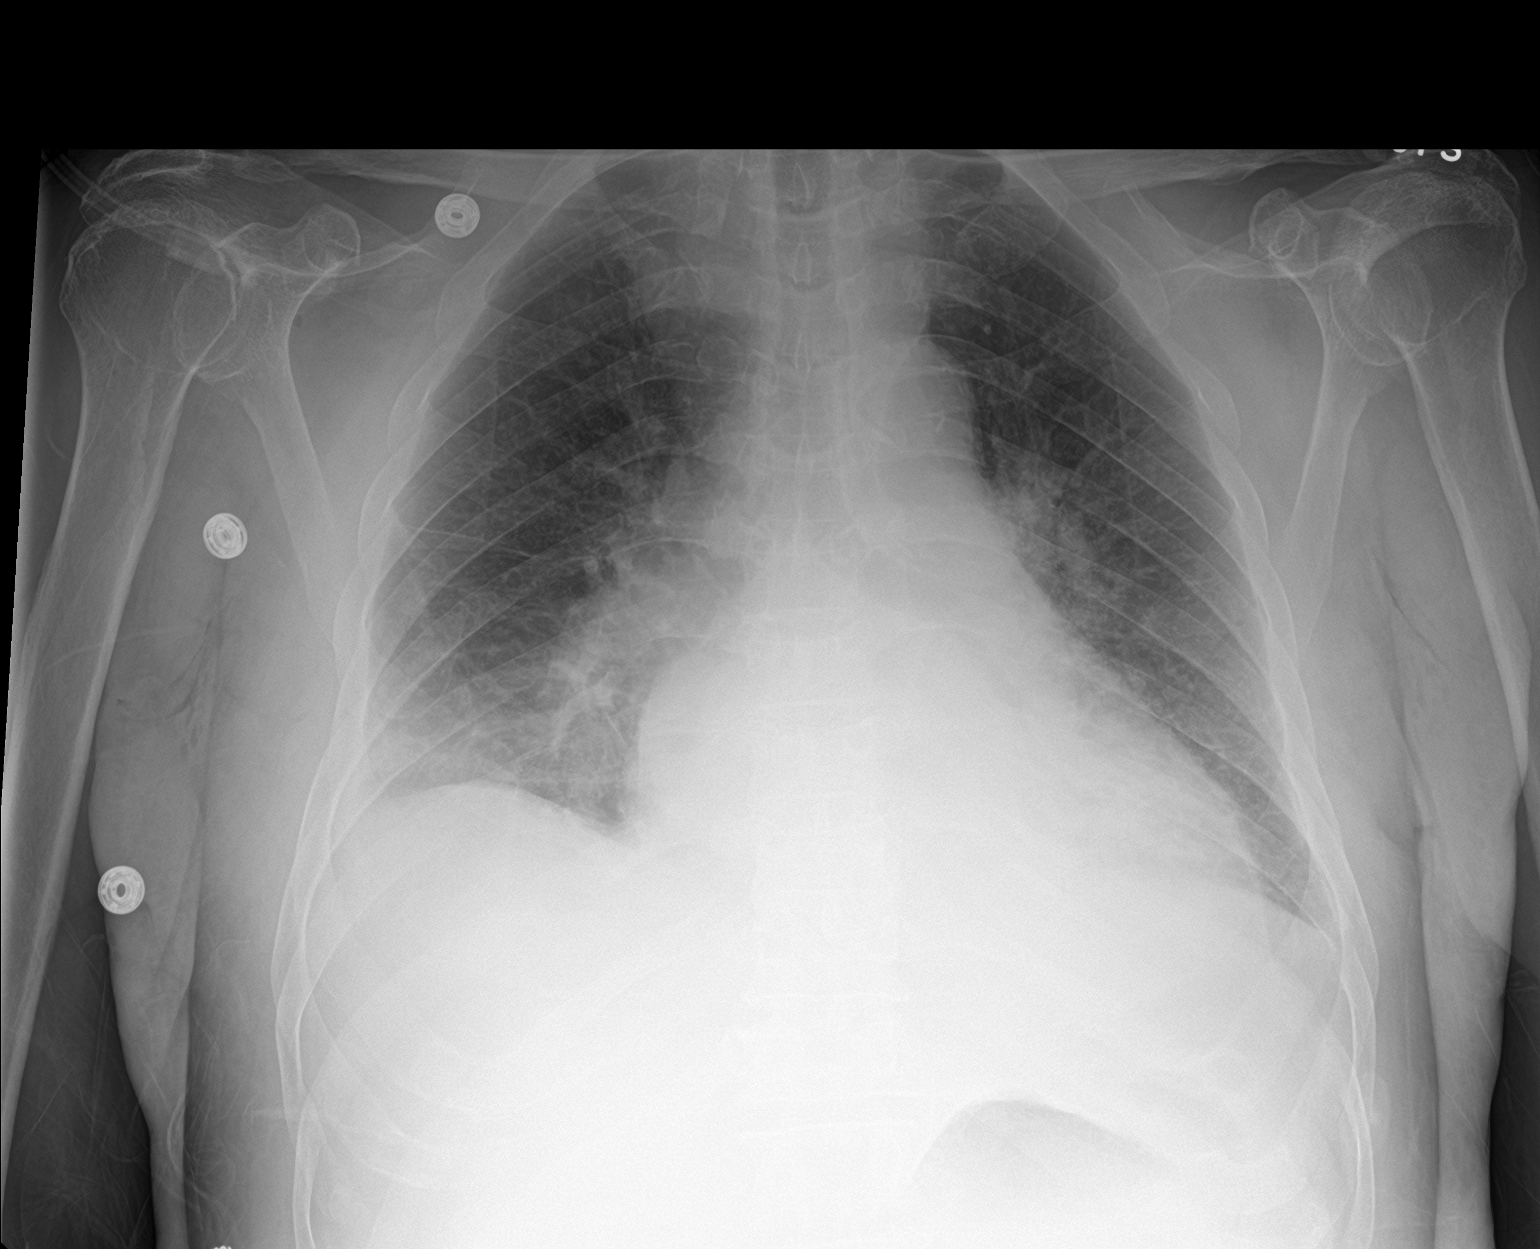

[2 of 2 positions shown; findings below may reference images not displayed]

FINDINGS: Mediastinum and hilar structures normal. Cardiomegaly with bilateral
interstitial prominence. Moderate right pleural effusion. Findings
suggest CHF. Pneumonitis cannot be excluded. No pneumothorax.
Degenerative changes both shoulders. No acute bony abnormality.
IMPRESSION: Cardiomegaly with bilateral interstitial prominence. Small right
pleural effusion. Findings suggest CHF.

## 2019-09-04 MED ORDER — FOLIC ACID 1 MG PO TABS
1.0000 mg | ORAL_TABLET | Freq: Every day | ORAL | Status: DC
  Administered 2019-09-04 – 2019-09-09 (×6): 1 mg via ORAL
  Filled 2019-09-04 (×6): qty 1

## 2019-09-04 MED ORDER — FUROSEMIDE 10 MG/ML IJ SOLN
40.0000 mg | Freq: Once | INTRAMUSCULAR | Status: AC
  Administered 2019-09-04: 40 mg via INTRAVENOUS
  Filled 2019-09-04: qty 4

## 2019-09-04 NOTE — Progress Notes (Signed)
  Echocardiogram 2D Echocardiogram has been performed.  Heide Brossart G Andres Vest 09/04/2019, 11:43 AM

## 2019-09-04 NOTE — Plan of Care (Signed)

## 2019-09-04 NOTE — Progress Notes (Signed)
Reported critical Na level of 119 to on call physician for internal medicine no orders given at this time.
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
## 2019-09-05 ENCOUNTER — Encounter (HOSPITAL_COMMUNITY): Payer: Self-pay | Admitting: Student in an Organized Health Care Education/Training Program

## 2019-09-05 DIAGNOSIS — R531 Weakness: Secondary | ICD-10-CM

## 2019-09-05 DIAGNOSIS — R601 Generalized edema: Secondary | ICD-10-CM

## 2019-09-05 DIAGNOSIS — E871 Hypo-osmolality and hyponatremia: Secondary | ICD-10-CM

## 2019-09-05 DIAGNOSIS — D649 Anemia, unspecified: Secondary | ICD-10-CM

## 2019-09-05 DIAGNOSIS — E039 Hypothyroidism, unspecified: Secondary | ICD-10-CM

## 2019-09-05 LAB — BASIC METABOLIC PANEL
Anion gap: 6 (ref 5–15)
Anion gap: 10 (ref 5–15)
Anion gap: 7 (ref 5–15)
BUN: <5 mg/dL — ABNORMAL LOW (ref 8–23)
BUN: <5 mg/dL — ABNORMAL LOW (ref 8–23)
BUN: <5 mg/dL — ABNORMAL LOW (ref 8–23)
CO2: 29 mmol/L (ref 22–32)
CO2: 24 mmol/L (ref 22–32)
CO2: 29 mmol/L (ref 22–32)
Calcium: 7.8 mg/dL — ABNORMAL LOW (ref 8.9–10.3)
Calcium: 7.6 mg/dL — ABNORMAL LOW (ref 8.9–10.3)
Calcium: 7.5 mg/dL — ABNORMAL LOW (ref 8.9–10.3)
Chloride: 89 mmol/L — ABNORMAL LOW (ref 98–111)
Chloride: 90 mmol/L — ABNORMAL LOW (ref 98–111)
Chloride: 90 mmol/L — ABNORMAL LOW (ref 98–111)
Creatinine, Ser: 0.85 mg/dL (ref 0.61–1.24)
Creatinine, Ser: 0.84 mg/dL (ref 0.61–1.24)
Creatinine, Ser: 0.83 mg/dL (ref 0.61–1.24)
GFR calc Af Amer: >60 mL/min (ref >60)
GFR calc Af Amer: >60 mL/min (ref >60)
GFR calc Af Amer: >60 mL/min (ref >60)
GFR calc non Af Amer: >60 mL/min (ref >60)
GFR calc non Af Amer: >60 mL/min (ref >60)
GFR calc non Af Amer: >60 mL/min (ref >60)
Glucose, Bld: 79 mg/dL (ref 70–99)
Glucose, Bld: 79 mg/dL (ref 70–99)
Glucose, Bld: 82 mg/dL (ref 70–99)
Potassium: 3.9 mmol/L (ref 3.5–5.1)
Potassium: 3.8 mmol/L (ref 3.5–5.1)
Potassium: 4.1 mmol/L (ref 3.5–5.1)
Sodium: 124 mmol/L — ABNORMAL LOW (ref 135–145)
Sodium: 124 mmol/L — ABNORMAL LOW (ref 135–145)
Sodium: 126 mmol/L — ABNORMAL LOW (ref 135–145)

## 2019-09-05 LAB — HEPATITIS B CORE ANTIBODY, TOTAL: Hep B Core Total Ab: REACTIVE — AB

## 2019-09-05 MED ORDER — FUROSEMIDE 10 MG/ML IJ SOLN: 40.0000 mg | Freq: Two times a day (BID) | INTRAMUSCULAR | Status: DC

## 2019-09-05 MED ORDER — FUROSEMIDE 10 MG/ML IJ SOLN
60.0000 mg | Freq: Two times a day (BID) | INTRAMUSCULAR | Status: DC
  Administered 2019-09-05 – 2019-09-07 (×5): 60 mg via INTRAVENOUS
  Filled 2019-09-05 (×5): qty 6

## 2019-09-05 MED ORDER — ACETAMINOPHEN 325 MG PO TABS
650.0000 mg | ORAL_TABLET | Freq: Once | ORAL | Status: AC | PRN
  Administered 2019-09-05: 650 mg via ORAL
  Filled 2019-09-05: qty 2

## 2019-09-05 NOTE — Progress Notes (Signed)
Paged for left leg pain. Evaluated patient and he is having left inguinal pain in setting of scrotal edema from HFrEF. Ordered scrotal support and Tylenol.
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
## 2019-09-06 DIAGNOSIS — E871 Hypo-osmolality and hyponatremia: Secondary | ICD-10-CM

## 2019-09-06 LAB — BASIC METABOLIC PANEL
Anion gap: 10 (ref 5–15)
Anion gap: 8 (ref 5–15)
BUN: <5 mg/dL — ABNORMAL LOW (ref 8–23)
BUN: <5 mg/dL — ABNORMAL LOW (ref 8–23)
CO2: 26 mmol/L (ref 22–32)
CO2: 32 mmol/L (ref 22–32)
Calcium: 7.2 mg/dL — ABNORMAL LOW (ref 8.9–10.3)
Calcium: 7.3 mg/dL — ABNORMAL LOW (ref 8.9–10.3)
Chloride: 92 mmol/L — ABNORMAL LOW (ref 98–111)
Chloride: 90 mmol/L — ABNORMAL LOW (ref 98–111)
Creatinine, Ser: 0.8 mg/dL (ref 0.61–1.24)
Creatinine, Ser: 0.77 mg/dL (ref 0.61–1.24)
GFR calc Af Amer: >60 mL/min (ref >60)
GFR calc Af Amer: >60 mL/min (ref >60)
GFR calc non Af Amer: >60 mL/min (ref >60)
GFR calc non Af Amer: >60 mL/min (ref >60)
Glucose, Bld: 65 mg/dL — ABNORMAL LOW (ref 70–99)
Glucose, Bld: 80 mg/dL (ref 70–99)
Potassium: 3.7 mmol/L (ref 3.5–5.1)
Potassium: 3.3 mmol/L — ABNORMAL LOW (ref 3.5–5.1)
Sodium: 128 mmol/L — ABNORMAL LOW (ref 135–145)
Sodium: 130 mmol/L — ABNORMAL LOW (ref 135–145)

## 2019-09-06 LAB — GLUCOSE, CAPILLARY: Glucose-Capillary: 98 mg/dL (ref 70–99)

## 2019-09-06 LAB — FACTOR 8 ASSAY: Coagulation Factor VIII: 78 % (ref 56–140)

## 2019-09-06 LAB — VITAMIN B12: Vitamin B-12: 884 pg/mL (ref 180–914)

## 2019-09-06 LAB — FACTOR 7 ASSAY: Factor VII Activity: 63 % (ref 51–186)

## 2019-09-06 LAB — FACTOR 5 ASSAY: Factor V Activity: 72 % (ref 70–150)

## 2019-09-06 MED ORDER — POTASSIUM CHLORIDE CRYS ER 20 MEQ PO TBCR
40.0000 meq | EXTENDED_RELEASE_TABLET | Freq: Once | ORAL | Status: AC
  Administered 2019-09-06: 40 meq via ORAL
  Filled 2019-09-06: qty 2

## 2019-09-06 MED ORDER — POLYETHYLENE GLYCOL 3350 17 G PO PACK
17.0000 g | PACK | Freq: Every day | ORAL | Status: DC
  Administered 2019-09-06 – 2019-09-09 (×3): 17 g via ORAL
  Filled 2019-09-06 (×4): qty 1

## 2019-09-06 NOTE — Plan of Care (Signed)

## 2019-09-06 NOTE — Progress Notes (Addendum)
   Subjective:  Steven Frazier is a 64 y.o. with PMH of HTN admit for hyponatremia on hospital day 3  [name] was examined and evaluated at bedside this am. He mentions continuing to have good urinary output overnight. Overnight he noted some discomfort of his left leg, especially with ambulation. Does not feel back to baseline.  Objective:  Vital signs in last 24 hours: Vitals:   09/05/19 1943 09/06/19 0314 09/06/19 0500 09/06/19 0914  BP: 100/63 (!) 87/52 101/65 100/60  Pulse: 82 72 80 82  Resp: 18 16 14 17   Temp: 98.8 F (37.1 C) 98 F (36.7 C) 98 F (36.7 C) 98.2 F (36.8 C)  TempSrc: Oral Oral Oral Oral  SpO2: 100% 100% 100% 100%   Gen: Well-developed, well nourished, NAD Pulm: CTAB, no rales, no wheezes Extm: ROM intact, No peripheral edema Skin: Dry, Warm, normal turgor Neuro: AAOx3, 4/5 strength LLE, 5/5 strength RLE  Assessment/Plan:  Principal Problem:   Hyponatremia Active Problems:   Acute systolic CHF (congestive heart failure) (HCC)   Microcytic anemia   Anasarca  Steven Frazier is a 64 y.o. with PMH of HTN admit for acute systolic heart failure.  Hypervolemic Hypotonic Hyponatremia 2/2 acute systolic heart failure Na trending up appropriately 124->126->128 in last 24 hours. Currently on furosemide IV 60mg  BID with 3.4L output yesterday. Creatinine stable at 0.8. Echo w/ EF of 40-45% with global hypokinesis and grade 2 diastolic dysfunction. Unclear etiology of heart failure but considering poor oral intake and diffuse hypokinesis, will check for beriberi - Check vitamin B1 - C/w furosemide 60mg  BID - I&Os, Daily weights - Monitor/replete potassium/mag  Left Lower Extremity Weakness Presenting w/ left leg pain overnight. Having isolated LLE weakness on exam. Denies acute onset. Denies any other focal neurological deficits. Possibly due to deconditioning but unilateral presentation concerning for msk or neurological origin.  - Consider imaging if worsening  symptoms. - PT eval  Hypoalbuminemia Unclear etiology. Possibly due to low protein intake from vegan diet. No elevated LFTs on admission to suggest acute hepatic disease. Viral hepatitis panel still pending (hep B core antibody +, hep B surface antigen -, hep B surface antibody pending. INR mildly elevated at 1.4. Coagulation factor labs pending. - F/u hep B surface antibody - F/u factors 5/7/8 - Monitor for bleed  Microcytic anemia Cbc showing microcytic anemia w/ hgb of 10.5, mCV of 70.8. Iron studies normal. Possibly due to thalassemia +/- anemia of chronic disease. - F/u Hgb fractionation  DVT prophx: Xarelto Diet:  2g sodium Bowel: Miralax Code: Full  Prior to Admission Living Arrangement: Home Anticipated Discharge Location: Home Barriers to Discharge: Medical treatment Dispo: Anticipated discharge in approximately 1-2 day(s).   , MD 09/06/2019, 1:39 PM Pager: [phone] After 5pm on weekdays and 1pm on weekends: On Call Pager: [phone]

## 2019-09-07 ENCOUNTER — Inpatient Hospital Stay (HOSPITAL_COMMUNITY): Payer: Medicaid Other

## 2019-09-07 DIAGNOSIS — N5089 Other specified disorders of the male genital organs: Secondary | ICD-10-CM

## 2019-09-07 DIAGNOSIS — E8809 Other disorders of plasma-protein metabolism, not elsewhere classified: Secondary | ICD-10-CM

## 2019-09-07 LAB — HEPATIC FUNCTION PANEL
ALT: 13 U/L (ref 0–44)
AST: 25 U/L (ref 15–41)
Albumin: 1.8 g/dL — ABNORMAL LOW (ref 3.5–5.0)
Alkaline Phosphatase: 66 U/L (ref 38–126)
Bilirubin, Direct: 0.2 mg/dL (ref 0.0–0.2)
Indirect Bilirubin: 0.7 mg/dL (ref 0.3–0.9)
Total Bilirubin: 0.9 mg/dL (ref 0.3–1.2)
Total Protein: 5 g/dL — ABNORMAL LOW (ref 6.5–8.1)

## 2019-09-07 LAB — BASIC METABOLIC PANEL
Anion gap: 7 (ref 5–15)
BUN: <5 mg/dL — ABNORMAL LOW (ref 8–23)
CO2: 34 mmol/L — ABNORMAL HIGH (ref 22–32)
Calcium: 7.3 mg/dL — ABNORMAL LOW (ref 8.9–10.3)
Chloride: 91 mmol/L — ABNORMAL LOW (ref 98–111)
Creatinine, Ser: 0.7 mg/dL (ref 0.61–1.24)
GFR calc Af Amer: >60 mL/min (ref >60)
GFR calc non Af Amer: >60 mL/min (ref >60)
Glucose, Bld: 68 mg/dL — ABNORMAL LOW (ref 70–99)
Potassium: 3.4 mmol/L — ABNORMAL LOW (ref 3.5–5.1)
Sodium: 132 mmol/L — ABNORMAL LOW (ref 135–145)

## 2019-09-07 LAB — MAGNESIUM: Magnesium: 1.2 mg/dL — ABNORMAL LOW (ref 1.7–2.4)

## 2019-09-07 LAB — SEDIMENTATION RATE: Sed Rate: 5 mm/hr (ref 0–16)

## 2019-09-07 LAB — HEPATITIS B SURFACE ANTIBODY, QUANTITATIVE: Hep B S AB Quant (Post): 168.8 m[IU]/mL (ref >9.9)

## 2019-09-07 LAB — C-REACTIVE PROTEIN: CRP: 3 mg/dL — ABNORMAL HIGH (ref <1.0)

## 2019-09-07 LAB — CK: Total CK: 165 U/L (ref 49–397)

## 2019-09-07 LAB — LACTATE DEHYDROGENASE: LDH: 184 U/L (ref 98–192)

## 2019-09-07 IMAGING — US US SCROTUM W/ DOPPLER COMPLETE
1 series · 14 of 25 positions shown · non-contrast
Comparison: None.

CLINICAL DATA: Scrotal swelling for 2 weeks

EXAM:
SCROTAL ULTRASOUND
DOPPLER ULTRASOUND OF THE TESTICLES
TECHNIQUE: Complete ultrasound examination of the testicles, epididymis, and
other scrotal structures was performed. Color and spectral Doppler
ultrasound were also utilized to evaluate blood flow to the
testicles.

[Series 1: us scrotum w/doppler · 14 of 67 slices shown]
[im 1/67]
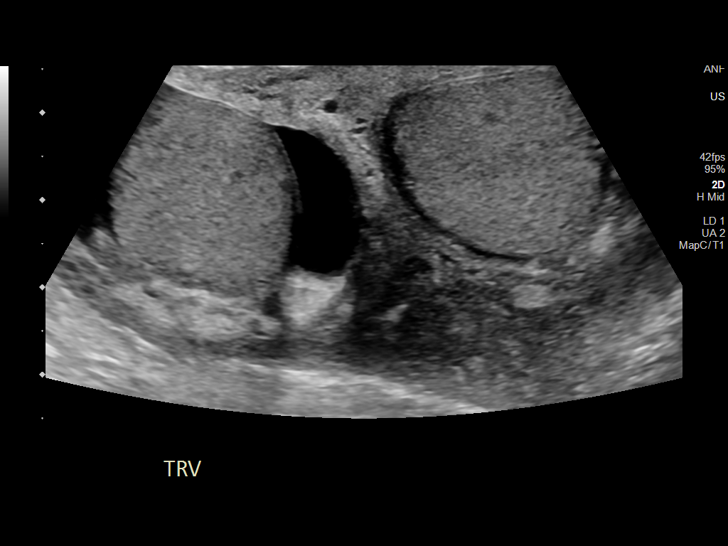
[im 6/67]
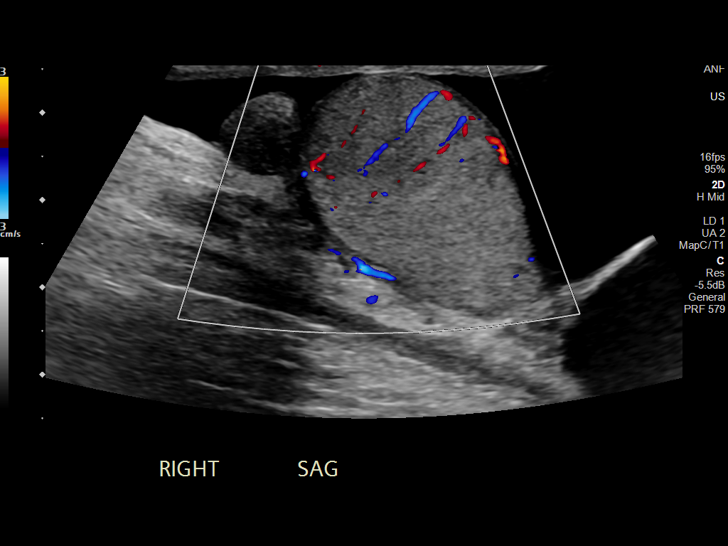
[im 12/67]
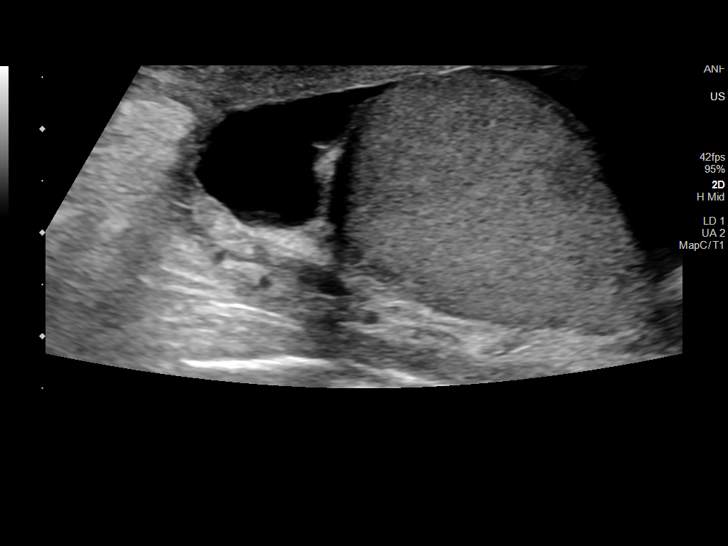
[im 17/67]
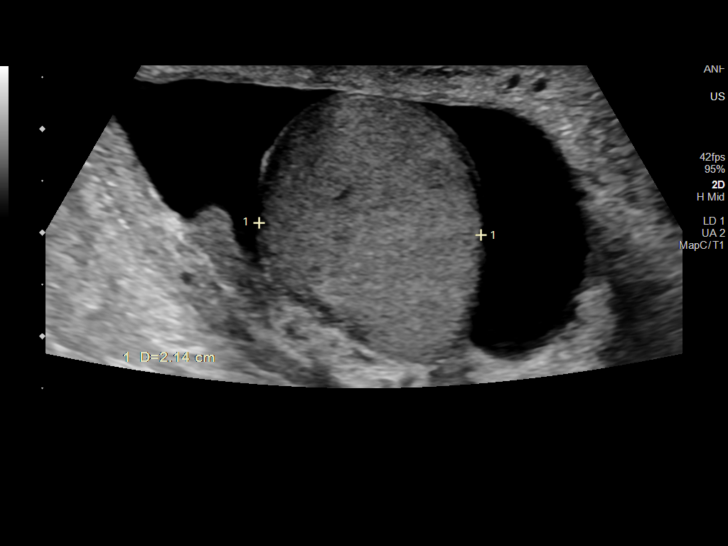
[im 23/67]
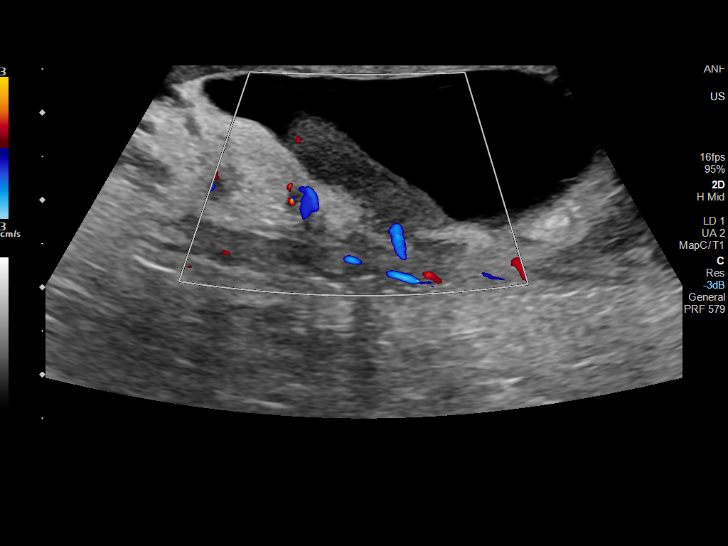
[im 25/67]
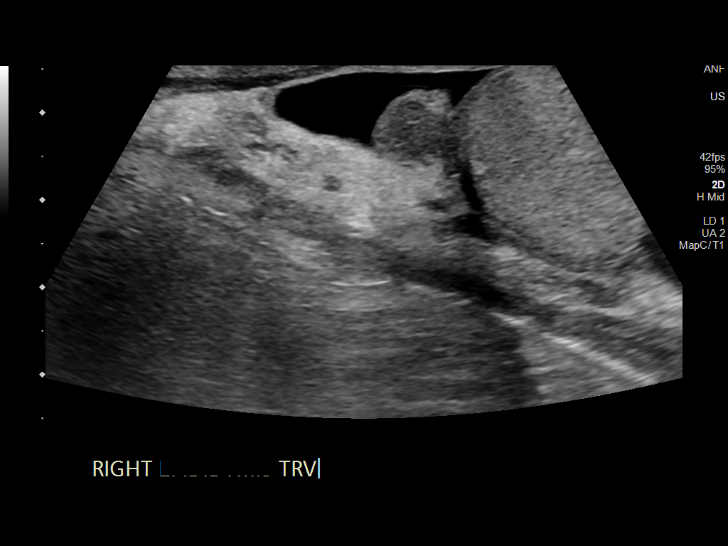
[im 31/67]
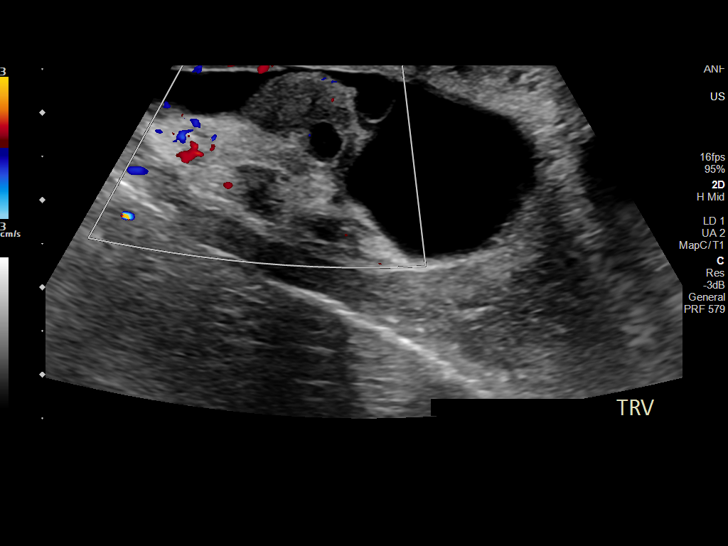
[im 36/67]
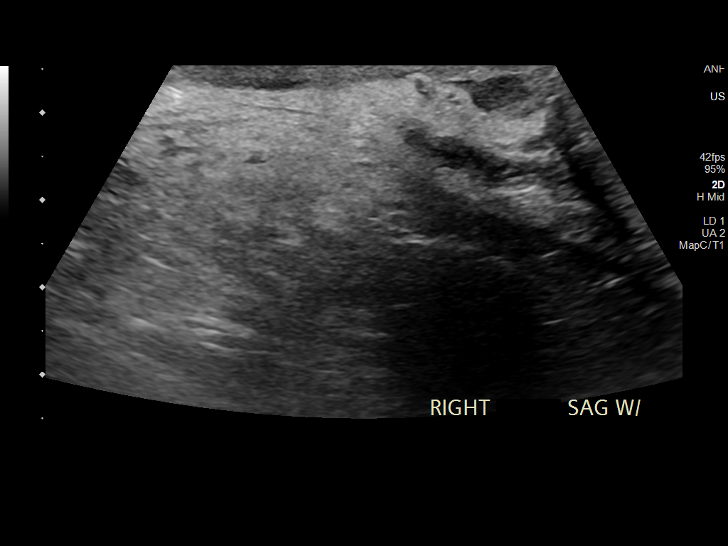
[im 42/67]
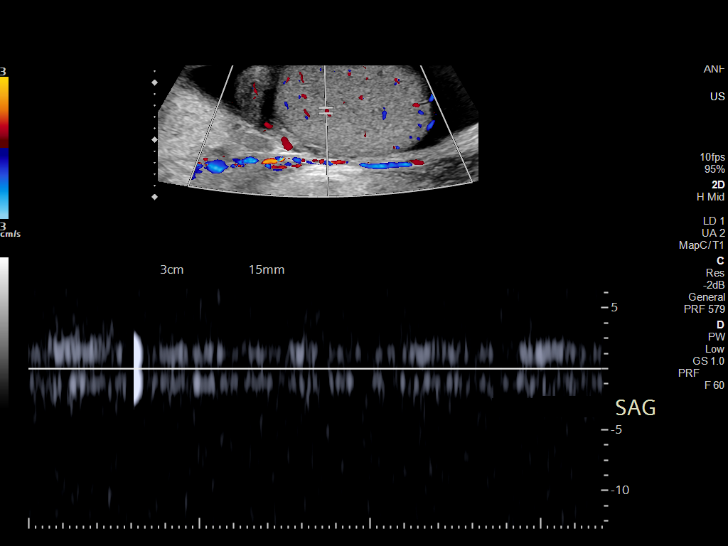
[im 45/67]
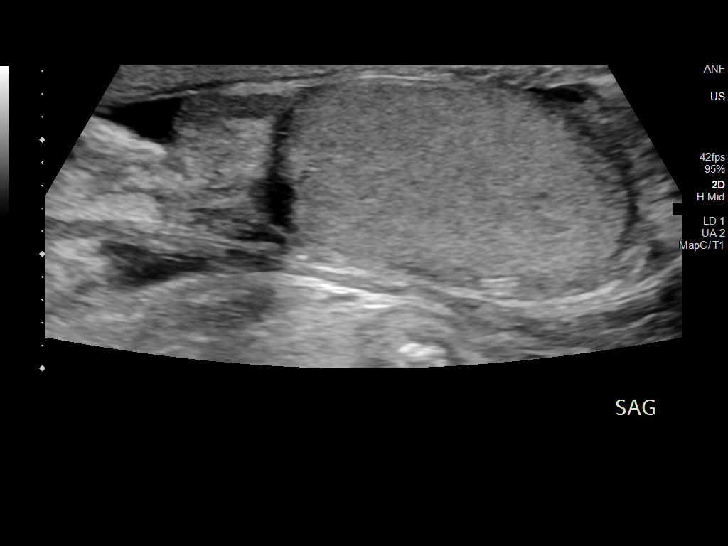
[im 50/67]
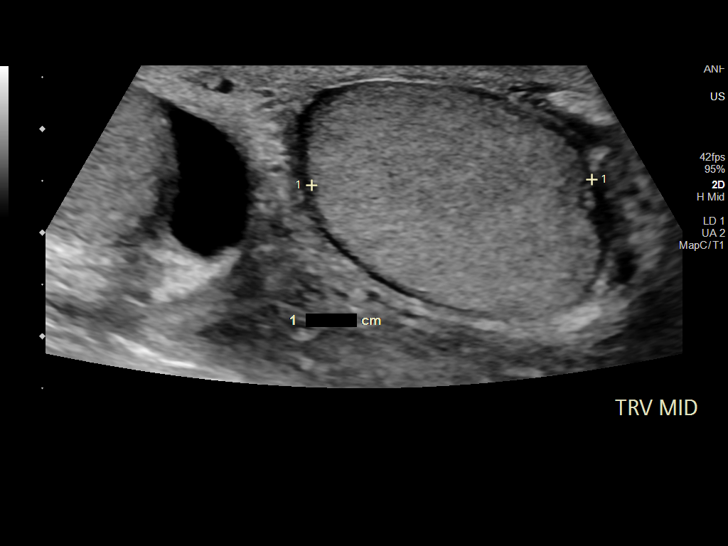
[im 56/67]
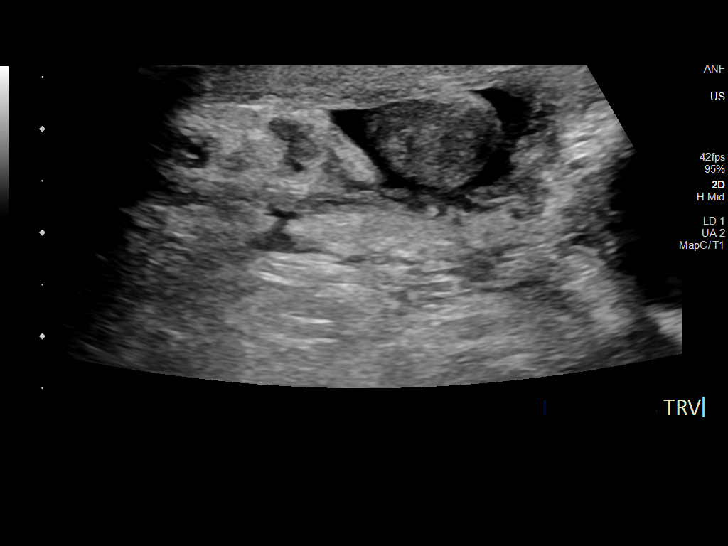
[im 61/67]
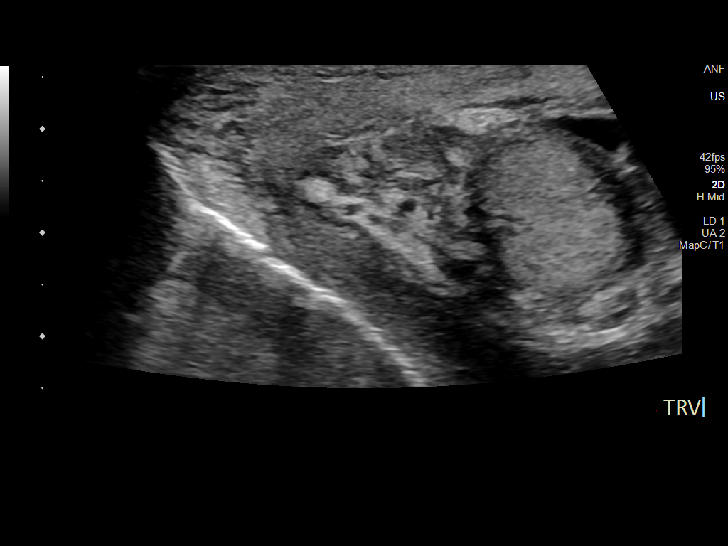
[im 67/67]
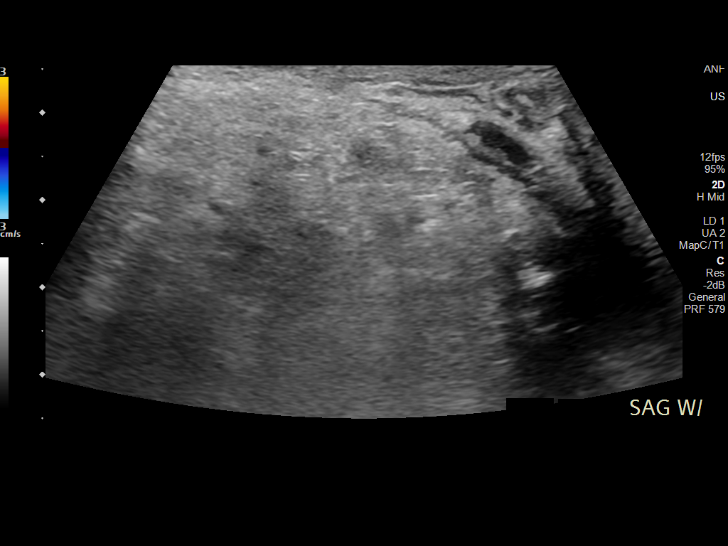

[14 of 25 positions shown; findings below may reference images not displayed]

FINDINGS: Right testicle

Measurements: 3.3 x 2.3 x 2.1 cm. No mass or microlithiasis
visualized.

Left testicle

Measurements: 3.4 x 2.0 x 2.7 cm. No mass or microlithiasis
visualized.

Right epididymis: Incidental subcentimeter epididymal cyst or
spermatocele. Otherwise normal in size and appearance.

Left epididymis:  Normal in size and appearance.

Hydrocele:  Moderate right, small left hydroceles.

Varicocele:  None visualized.

Pulsed Doppler interrogation of both testes demonstrates normal low
resistance arterial and venous waveforms bilaterally.
IMPRESSION: 1. Moderate right, small left hydroceles.
2. The bilateral testicles proper are normal in size and appearance
with symmetric bilateral arterial and venous Doppler flow.

## 2019-09-07 MED ORDER — FUROSEMIDE 10 MG/ML IJ SOLN
60.0000 mg | Freq: Every day | INTRAMUSCULAR | Status: DC
  Administered 2019-09-08: 60 mg via INTRAVENOUS
  Filled 2019-09-07: qty 6

## 2019-09-07 MED ORDER — POTASSIUM CHLORIDE CRYS ER 20 MEQ PO TBCR
40.0000 meq | EXTENDED_RELEASE_TABLET | Freq: Once | ORAL | Status: AC
  Administered 2019-09-07: 40 meq via ORAL
  Filled 2019-09-07: qty 2

## 2019-09-07 MED ORDER — ACETAMINOPHEN 325 MG PO TABS: 650.0000 mg | ORAL_TABLET | Freq: Four times a day (QID) | ORAL | Status: DC | PRN

## 2019-09-07 MED ORDER — MAGNESIUM SULFATE 2 GM/50ML IV SOLN
2.0000 g | Freq: Once | INTRAVENOUS | Status: AC
  Administered 2019-09-07: 2 g via INTRAVENOUS
  Filled 2019-09-07: qty 50

## 2019-09-07 NOTE — Plan of Care (Signed)

## 2019-09-07 NOTE — Progress Notes (Signed)
Subjective: Day 4 for Steven Frazier is a 64 y.o. male with PMHx of HTN presented with hypervolemic hypotonic hyponatremia, found to have HFrEF.   Reports some swelling overnight. Reports some pain and he is concerned for hernia, but on exam appeared to have scrotal swelling. Otherwise, nurse paged [place] about repleting electrolytes and giving patient tylenol PRN.   Objective:  Vital signs in last 24 hours: Vitals:   09/06/19 1700 09/06/19 2008 09/07/19 0358 09/07/19 0500  BP: (!) 103/59 106/65 109/62   Pulse: 82 84 84   Resp: _0 Temp: 98.8 F (37.1 C) 98.5 F (36.9 C) 98.2 F (36.8 C)   TempSrc: Oral Oral Oral   SpO2: 98% 100% 96%   Weight:    64.6 kg  Height:       Weight change:   Intake/Output Summary (Last 24 hours) at 09/07/2019 1008 Last data filed at 09/07/2019 0600 Gross per 24 hour  Intake 480 ml  Output 3300 ml  Net -2820 ml   Physical Exam: GEN: NAD, lying in bed, appeared malnourished EYES: Periorbital swelling did not appear to be present  ENT: Moist, mucous membrane, no abnormalities appeared to be present CV: RRR, no murmurs, rubs, or gallops PULM: CTA B, no wheezing, rhonchi or crackles ABD: Soft, NT/ND, +BS EXT: +1 mild pitting edema on feet only. Atrophy and muscle wasting seen on hands and legs PSYCH: Alert, appropriate, and normal affect GU: Scrotal swelling was present    Assessment/Plan: Day 4 for Steven Frazier is a 64 y.o. male with PMHx of HTN presented with hypervolemic hypotonic hyponatremia, found to have HFrEF.  Principal Problem:   Hyponatremia Active Problems:   Acute systolic CHF (congestive heart failure) (HCC)   Microcytic anemia   Anasarca Hypervolemic Hypotonic Hyponatremia 2/2 acute systolic heart failure. ECHO findings confirmed HFrEF with LVEF of 40-45%, findings were consistent for grade II diastolic dysfunction. Sodium levels are trending up, 128->130->132 in the last 24 hours. Recent Mg2+ was low at 1.2 and K+ was  low at 3.4. Both electrolytes were repleted. Currently on IV lasix 60 mg BID, and has tolerated well. Will transition down to once daily. Recent creatine was at 0.70 and net (-) of 3.7 L in the last 24 hours. Found to have scrotal swelling, likely from HFrEF. Currently on scrotal support & tylenol PRN. Had new signs of muscles wasting and atrophy of hands that wasn't present on admission, concerning for possible inflammatory disease. Unclear etiology of HFrEF, team is considering beriberi and will work-up for other inflammatory processes.  Plan: --Check vit. B1 --Check CK, aldolase, ESR, CRP, LDH, serum protein electrophoresis, immunofixation electrophoresis, kappa/lambda light chains, ANA, ENA, and myositis panel  --Transition IV lasix 60 mg BID to once daily --Check hepatic function panel  --I&Os, daily weights --Monitor/replete K+ and Mg2+, will also order PO43- lab to check given recent low Mg2+ levels    LLE weakness. Unclear of where this is from. Presented with LLE pain overnight 8/7, has not had any changes to this LLE pain today. Suspecting to be from deconditioning, but because of the unilateral localization previously noted, team was concerned for MSK or neuro origin. Continue monitoring and appreciate PT future recs.  Plan:  --Awaiting PT eval --Consider imaging if symptoms worsen    Hypoalbuminemia. Recent coagulation factors 5,7, & 8 returned WNL. LFTs were WNL on admission, re-checking hepatic function panel for any changes. Suspect this to be in setting of low protein intake  from vegan diet or could be in this setting of HFrEF. Recent hepatitis panel was consistent with immunity to a natural infection for hepatitis B. Patient and family denied history of hepatitis B in the past when asked over the weekend.  Plan: --Continue monitoring for bleeds  Microcytic anemia. CBC showed Hb of 10.5 with MCV of 70.8. Iron studies normal with exception of low TIBC at 218. Did not perfectly  align with AoCD, suspecting thalassemia. Receiving oral folate and awaiting Hb electrophoresis. Team will continue to monitor, no immediate action is needed at this time. Plan:  --Check Hb fractionation --Continue folate 1 mg oral daily     LOS: 4 days   Steven Frazier, Medical Student 09/07/2019, 10:08 AM Pager: [phone]

## 2019-09-07 NOTE — Evaluation (Signed)
Physical Therapy Evaluation Patient Details Name: Steven Frazier MRN: [id number] DOB: [birth year] Today's Date: 09/07/2019   History of Present Illness  Osamu Olguin is a 64 y.o. with PMH of HTN admit for acute systolic heart failure. Pt has also had LE pain and weakness.  Clinical Impression  Pt admitted with above diagnosis. Pt presents with generalized weakness. Min A for bed mobility and power up to standing. Min A for ambulation 30', began with RW but pt wanted to try without AD so turned RW away but pt continued to need HHA for support and fatigued very quickly. Pt with scrotal pain and edema that is affecting gait pattern as well.  Pt currently with functional limitations due to the deficits listed below (see PT Problem List). Pt will benefit from skilled PT to increase their independence and safety with mobility to allow discharge to the venue listed below.       Follow Up Recommendations Home health PT;Supervision for mobility/OOB    Equipment Recommendations  Rolling walker with 5" wheels    Recommendations for Other Services OT consult     Precautions / Restrictions Precautions Precautions: Fall Restrictions Weight Bearing Restrictions: No      Mobility  Bed Mobility Overal bed mobility: Needs Assistance Bed Mobility: Supine to Sit     Supine to sit: Mod assist     General bed mobility comments: mod A for LE's and HHA for elevation of trunk. Pt able to position self once EOB  Transfers Overall transfer level: Needs assistance Equipment used: Rolling walker (2 wheeled);None Transfers: Sit to/from [employer] to Stand: Min assist Stand pivot transfers: Min assist       General transfer comment: min A for power up, increased time and effort  Ambulation/Gait Ambulation/Gait assistance: Min assist Gait Distance (Feet): 30 Feet Assistive device: Rolling walker (2 wheeled);None Gait Pattern/deviations: Step-through pattern;Decreased stride  length;Trunk flexed Gait velocity: decreased Gait velocity interpretation: <1.8 ft/sec, indicate of risk for recurrent falls General Gait Details: began with RW for support but pt does not prefer it so let him ambulate without AD but he then needed HHA  Stairs            Wheelchair Mobility    Modified Rankin (Stroke Patients Only)       Balance Overall balance assessment: Needs assistance Sitting-balance support: Feet supported;Single extremity supported Sitting balance-Leahy Scale: Fair     Standing balance support: Bilateral upper extremity supported Standing balance-Leahy Scale: Poor                               Pertinent Vitals/Pain Pain Assessment: Faces Faces Pain Scale: Hurts whole lot Pain Location: scrotum Pain Descriptors / Indicators: Discomfort;Grimacing Pain Intervention(s): Limited activity within patient's tolerance;Monitored during session    Home Living Family/patient expects to be discharged to:: Private residence Living Arrangements: Spouse/significant other;Children Available Help at Discharge: Family;Available 24 hours/day Type of Home: House         Home Equipment: None Additional Comments: pt lives with his wife and one of their children, visitor today was daughter who lives nearby    Prior Function Level of Independence: Independent         Comments: was completely independent until a few weeks ago per daughter, but has gotten weaker and weaker and has begun to need assist with dressing and bathing     Hand Dominance        Extremity/Trunk Assessment  Upper Extremity Assessment Upper Extremity Assessment: Generalized weakness    Lower Extremity Assessment Lower Extremity Assessment: Generalized weakness    Cervical / Trunk Assessment Cervical / Trunk Assessment: Kyphotic  Communication   Communication: No difficulties  Cognition Arousal/Alertness: Awake/alert Behavior During Therapy: Flat  affect Overall Cognitive Status: Impaired/Different from baseline Area of Impairment: Memory;Problem solving                     Memory: Decreased short-term memory       Problem Solving: Decreased initiation;Difficulty sequencing;Requires verbal cues General Comments: often lets daughter answer for him and sometimes does not understand question first time asked and needs to be reworded      General Comments General comments (skin integrity, edema, etc.): BP supine 99/71, sitting 110/75, O2 sats in low 90's on RA, HR 85 bpm    Exercises     Assessment/Plan    PT Assessment Patient needs continued PT services  PT Problem List Decreased strength;Decreased activity tolerance;Decreased balance;Decreased mobility;Decreased cognition;Decreased knowledge of use of DME;Decreased knowledge of precautions;Pain       PT Treatment Interventions DME instruction;Gait training;Stair training;Functional mobility training;Therapeutic activities;Therapeutic exercise;Balance training;Patient/family education    PT Goals (Current goals can be found in the Care Plan section)  Acute Rehab PT Goals Patient Stated Goal: return home PT Goal Formulation: With patient Time For Goal Achievement: 09/21/19 Potential to Achieve Goals: Good    Frequency Min 3X/week   Barriers to discharge        Co-evaluation               AM-PAC PT "6 Clicks" Mobility  Outcome Measure Help needed turning from your back to your side while in a flat bed without using bedrails?: A Little Help needed moving from lying on your back to sitting on the side of a flat bed without using bedrails?: A Little Help needed moving to and from a bed to a chair (including a wheelchair)?: A Lot Help needed standing up from a chair using your arms (e.g., wheelchair or bedside chair)?: A Lot Help needed to walk in hospital room?: A Little Help needed climbing 3-5 steps with a railing? : A Lot 6 Click Score: 15    End  of Session Equipment Utilized During Treatment: Gait belt Activity Tolerance: Patient tolerated treatment well Patient left: in chair;with call bell/phone within reach;with family/visitor present;with chair alarm set Nurse Communication: Mobility status PT Visit Diagnosis: Unsteadiness on feet (R26.81);Muscle weakness (generalized) (M62.81);Difficulty in walking, not elsewhere classified (R26.2);Pain Pain - part of body:  (scrotum)    Time: 2409-7353 PT Time Calculation (min) (ACUTE ONLY): 51 min   Charges:   PT Evaluation $PT Eval Moderate Complexity: 1 Mod PT Treatments $Gait Training: 8-22 mins $Therapeutic Activity: 8-22 mins        Lyanne Co, PT  Acute Rehab Services  Pager [phone] Office [phone]   Lawana Chambers Nanda Bittick 09/07/2019, 3:23 PM

## 2019-09-08 DIAGNOSIS — M6284 Sarcopenia: Secondary | ICD-10-CM

## 2019-09-08 DIAGNOSIS — D721 Eosinophilia, unspecified: Secondary | ICD-10-CM

## 2019-09-08 DIAGNOSIS — R634 Abnormal weight loss: Secondary | ICD-10-CM

## 2019-09-08 LAB — BASIC METABOLIC PANEL
Anion gap: 10 (ref 5–15)
BUN: <5 mg/dL — ABNORMAL LOW (ref 8–23)
CO2: 29 mmol/L (ref 22–32)
Calcium: 7.9 mg/dL — ABNORMAL LOW (ref 8.9–10.3)
Chloride: 94 mmol/L — ABNORMAL LOW (ref 98–111)
Creatinine, Ser: 0.63 mg/dL (ref 0.61–1.24)
GFR calc Af Amer: >60 mL/min (ref >60)
GFR calc non Af Amer: >60 mL/min (ref >60)
Glucose, Bld: 67 mg/dL — ABNORMAL LOW (ref 70–99)
Potassium: 3.9 mmol/L (ref 3.5–5.1)
Sodium: 133 mmol/L — ABNORMAL LOW (ref 135–145)

## 2019-09-08 LAB — CBC WITH DIFFERENTIAL/PLATELET
Abs Immature Granulocytes: 0.01 10*3/uL (ref 0.00–0.07)
Basophils Absolute: 0 10*3/uL (ref 0.0–0.1)
Basophils Relative: 1 %
Eosinophils Absolute: 0.6 10*3/uL — ABNORMAL HIGH (ref 0.0–0.5)
Eosinophils Relative: 10 %
HCT: 36.2 % — ABNORMAL LOW (ref 39.0–52.0)
Hemoglobin: 12.7 g/dL — ABNORMAL LOW (ref 13.0–17.0)
Immature Granulocytes: 0 %
Lymphocytes Relative: 48 %
Lymphs Abs: 2.7 10*3/uL (ref 0.7–4.0)
MCH: 25.4 pg — ABNORMAL LOW (ref 26.0–34.0)
MCHC: 35.1 g/dL (ref 30.0–36.0)
MCV: 72.4 fL — ABNORMAL LOW (ref 80.0–100.0)
Monocytes Absolute: 0.7 10*3/uL (ref 0.1–1.0)
Monocytes Relative: 12 %
Neutro Abs: 1.7 10*3/uL (ref 1.7–7.7)
Neutrophils Relative %: 29 %
Platelets: 191 10*3/uL (ref 150–400)
RBC: 5 MIL/uL (ref 4.22–5.81)
RDW: 15.3 % (ref 11.5–15.5)
WBC: 5.7 10*3/uL (ref 4.0–10.5)
nRBC: 0 % (ref 0.0–0.2)

## 2019-09-08 LAB — ANTI-SMITH ANTIBODY: ENA SM Ab Ser-aCnc: <0.2 AI (ref 0.0–0.9)

## 2019-09-08 LAB — PHOSPHORUS: Phosphorus: 2.6 mg/dL (ref 2.5–4.6)

## 2019-09-08 LAB — PROTEIN ELECTROPHORESIS, SERUM
A/G Ratio: 0.7 (ref 0.7–1.7)
Albumin ELP: 2 g/dL — ABNORMAL LOW (ref 2.9–4.4)
Alpha-1-Globulin: 0.2 g/dL (ref 0.0–0.4)
Alpha-2-Globulin: 0.5 g/dL (ref 0.4–1.0)
Beta Globulin: 0.6 g/dL — ABNORMAL LOW (ref 0.7–1.3)
Gamma Globulin: 1.4 g/dL (ref 0.4–1.8)
Globulin, Total: 2.8 g/dL (ref 2.2–3.9)
Total Protein ELP: 4.8 g/dL — ABNORMAL LOW (ref 6.0–8.5)

## 2019-09-08 LAB — KAPPA/LAMBDA LIGHT CHAINS
Kappa free light chain: 18.9 mg/L (ref 3.3–19.4)
Kappa, lambda light chain ratio: 0.94 (ref 0.26–1.65)
Lambda free light chains: 20.2 mg/L (ref 5.7–26.3)

## 2019-09-08 LAB — ANA: Anti Nuclear Antibody (ANA): NEGATIVE

## 2019-09-08 LAB — ANTIEXTRACTABLE NUCLEAR AG
ENA SM Ab Ser-aCnc: <0.2 AI (ref 0.0–0.9)
Ribonucleic Protein: <0.2 AI (ref 0.0–0.9)

## 2019-09-08 LAB — ALDOLASE: Aldolase: 6.6 U/L (ref 3.3–10.3)

## 2019-09-08 LAB — ANTI-SCLERODERMA ANTIBODY: Scleroderma (Scl-70) (ENA) Antibody, IgG: <0.2 AI (ref 0.0–0.9)

## 2019-09-08 LAB — MAGNESIUM
Magnesium: 1.4 mg/dL — ABNORMAL LOW (ref 1.7–2.4)
Magnesium: 1.5 mg/dL — ABNORMAL LOW (ref 1.7–2.4)

## 2019-09-08 LAB — ANTI-JO 1 ANTIBODY, IGG: Anti JO-1: <0.2 AI (ref 0.0–0.9)

## 2019-09-08 MED ORDER — MAGNESIUM SULFATE 2 GM/50ML IV SOLN
2.0000 g | Freq: Once | INTRAVENOUS | Status: AC
  Administered 2019-09-08: 2 g via INTRAVENOUS
  Filled 2019-09-08: qty 50

## 2019-09-08 NOTE — Progress Notes (Addendum)
Subjective: Day 5 forMr. Steven Frazier, a 64 y.o. 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  Team talked to PCP who reports that the patient had an unintentional weight loss from last September to this July 8th, from 158 lbs to 131 lbs. Otherwise she reported no abnormalities in her care of this patient. Saw patient for neck pain in December and gave him cyclobenzaprine and naproxen. Reports he managed well. Team is awaiting records from his hospital in [place], [place].   Today, patient interviewed at bedside and states he is doing the same. Daughter was present in the room and reports that he has had decreased muscle mass for a long time. Reports that the scrotal swelling and pain has reduced. Daughter also reports a history of pain w/ lifting arms. States his BM have been normal, usually 1 per day and says it's a lot at one time.  Objective:  Vital signs in last 24 hours: Vitals:   09/07/19 1737 09/07/19 2013 09/08/19 0328 09/08/19 0752  BP: 94/70 109/70 118/80 119/76  Pulse: 77 84 91 91  Resp: 14 17 17 18   Temp: 97.8 F (36.6 C) (!) 97.5 F (36.4 C) 97.7 F (36.5 C) 98.2 F (36.8 C)  TempSrc: Oral Oral Oral Oral  SpO2: 96% 100% 96% 95%  Weight:      Height:       Weight change:   Intake/Output Summary (Last 24 hours) at 09/08/2019 1325 Last data filed at 09/08/2019 0100 Gross per 24 hour  Intake 120 ml  Output 500 ml  Net -380 ml   Physical Exam: GEN: NAD, lying in bed, appeared malnourished EYES: No periorbital swelling ENT: No signs of palpable lymph nodes were present  CV: RRR, no murmurs, rubs, or gallops PULM: CTA B, no wheezing, rhonchi or crackles ABD: Soft, NT/ND, +BS EXT: 0/+1 mild pitting edema on feet only. Atrophy and muscle wasting seen on hands and legs PSYCH: Alert, appropriate, and normal affect GU: Scrotal swelling was present   Assessment/Plan: Day 5 forMr. Steven Frazier is a 64 y.o. male with PMHx  of HTN who presented with hypervolemic hypotonic hyponatremia likely 2/2 acute HFmrEF.  Principal Problem:   Hyponatremia Active Problems:   Acute systolic CHF (congestive heart failure) (HCC)   Microcytic anemia   Anasarca Hypervolemic Hypotonic Hyponatremia likely 2/2 acute systolic heart failure. 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 Sodium levels are trending up, most recent at 133. Recent Mg2+ was low at 1.4 after repletion. Will replete again and closely monitor levels. K+ level was at 3.9. Recently transitioned down to IV lasix 60 mg once daily, net (-) output of 2.34 L seen yesterday. Kidney function has been WNL, with creatine at 0.63. Still unclear of the etiology of this patient's sudden HFmrEF. Waiting on thiamine to check for beriberi.  Plan: --Check vit. B1 --Continue IV lasix 60 mg BID once daily --I&Os, daily weights --Monitor/replete K+, Mg2+, and PO43-    Unintentional weight loss. Documented unintentional weight loss of 27 lbs per PCP. Panel for inflammatory disease involvement thus far has remained unremarkable. Aldolase, CK, ESR, LDH, anti-scleroderma Ab, anti-smith Ab, anti-Jo 1 Ab, ANA, and ENA were WNL, though team did see an isolated elevation in CRP at 3.0. Will await and examine pending labs.  Plan:  --Check serum protein electrophoresis, immunofixation electrophoresis, and kappa/lambda light chains  Eosinophilia. CBC w/diff showed absolute eosinophilia at 0.6, though had a normal  white count. Given that this patient is originally from [place], team is doing a work up for potential reactivation of an underlying parasitic infection. Plan: --Check plasmodium sp. PCR, blood smear, and strongyloides Ab IgG  Hydrocele. Found to have scrotal swelling, likely from HFrEF. Confirmed to have moderate right and small left hydroceles on U/S scrotum w/ Doppler. Currently on scrotal support & tylenol PRN. Awaiting OT eval for scrotal sling  fabrication. Do not anticipate any surgical interventions. Plan:  --Awaiting OT eval --Scrotal support --Tyelenol PRN   LLE weakness. Suspecting to be from deconditioning or from this unknown underlying disorder patient has. PT recommended home health PT and supervision for mobility on discharge. Consider imaging if symptoms worsen during stay.  Plan:  --Continue monitoring during stay  --Continue in-room ambulation with family   Hypoalbuminemia. Recent total protein was low at 5.0 and albumin at 1.8. Suspect this to be in setting of low protein intake from vegan diet or in this setting of acute HFmrEF given unremarkable labs to date. Hepatitis panel c/w immunity to a natural infection of hepatitis B. Plan: --Continue monitoring for bleeds  Microcytic anemia. CBC showed improvement of Hb at 12.7 with MCV of 72.4. Iron studies normal with exception of low TIBC at 218. Not c/w AoCD, suspecting thalassemia. Receiving oral folate and awaiting Hb electrophoresis. Team will continue to monitor, no immediate action is needed at this time. Plan:  --Check Hb fractionation --Continue folate 1 mg oral daily    LOS: 5 days   Steven Frazier, Medical Student 09/08/2019, 1:25 PM Pager: [phone]

## 2019-09-08 NOTE — Progress Notes (Signed)
Physical Therapy Treatment Patient Details Name: Steven Frazier MRN: [id number] DOB: 06-Dec-1955 Today's Date: 09/08/2019    History of Present Illness Steven Frazier is a 64 y.o. with PMH of HTN admit for acute systolic heart failure. Pt has also had LE pain and weakness.    PT Comments    Pt making good progress today, ambulated 100' with RW and supervision, took 3 min seated rest break and ambulated 100' again. Pt does not prefer RW but is safer with it and needs for energy conservation at this point so instructed to use until advised differently by HHPT. Pt agreeable. Scrotal swelling has decreased and pt no longer in pain and able to ambulate without widened gait pattern. PT will continue to follow.    Follow Up Recommendations  Home health PT;Supervision for mobility/OOB     Equipment Recommendations  Rolling walker with 5" wheels    Recommendations for Other Services OT consult     Precautions / Restrictions Precautions Precautions: Fall Restrictions Weight Bearing Restrictions: No    Mobility  Bed Mobility Overal bed mobility: Needs Assistance Bed Mobility: Supine to Sit     Supine to sit: Supervision     General bed mobility comments: increased time needed but no physical assist  Transfers Overall transfer level: Needs assistance Equipment used: Rolling walker (2 wheeled) Transfers: Sit to/from Stand Sit to Stand: Supervision         General transfer comment: performed sit<>stand x4 today from bed and recliner with vc's for hand placement several times but no physical assist given  Ambulation/Gait Ambulation/Gait assistance: Supervision Gait Distance (Feet): 100 Feet (2x) Assistive device: Rolling walker (2 wheeled) Gait Pattern/deviations: Step-through pattern;Decreased stride length Gait velocity: decreased Gait velocity interpretation: <1.8 ft/sec, indicate of risk for recurrent falls General Gait Details: seated rest break taken after 100'. Pt safe with  RW, encouraged him to use at home until HHPT instructed to progress to no AD. Pt agreeable. Needed for safety as well as energy conservation   Stairs             Wheelchair Mobility    Modified Rankin (Stroke Patients Only)       Balance Overall balance assessment: Needs assistance Sitting-balance support: Feet supported;Single extremity supported Sitting balance-Leahy Scale: Good     Standing balance support: Bilateral upper extremity supported Standing balance-Leahy Scale: Fair Standing balance comment: pt able to maintain static standing without support but support needed for safety with dynamic activities                            Cognition Arousal/Alertness: Awake/alert Behavior During Therapy: Flat affect Overall Cognitive Status: History of cognitive impairments - at baseline Area of Impairment: Memory;Problem solving                     Memory: Decreased short-term memory       Problem Solving: Decreased initiation;Difficulty sequencing;Requires verbal cues General Comments: spoke to pt's daughter more about cognition today and pt does appear to be baseline with some mild STM deficits      Exercises General Exercises - Lower Extremity Ankle Circles/Pumps: AROM;Both;10 reps;Seated Long Arc Quad: AROM;Both;10 reps;Seated    General Comments General comments (skin integrity, edema, etc.): VSS. Scrotal swelling has decreased and pt reports no pain and not ambulating with widened gait pattern      Pertinent Vitals/Pain Pain Assessment: No/denies pain    Home Living  Prior Function            PT Goals (current goals can now be found in the care plan section) Acute Rehab PT Goals Patient Stated Goal: return home PT Goal Formulation: With patient Time For Goal Achievement: 09/21/19 Potential to Achieve Goals: Good    Frequency    Min 3X/week      PT Plan Current plan remains appropriate     Co-evaluation              AM-PAC PT "6 Clicks" Mobility   Outcome Measure  Help needed turning from your back to your side while in a flat bed without using bedrails?: A Little Help needed moving from lying on your back to sitting on the side of a flat bed without using bedrails?: A Little Help needed moving to and from a bed to a chair (including a wheelchair)?: A Lot Help needed standing up from a chair using your arms (e.g., wheelchair or bedside chair)?: A Lot Help needed to walk in hospital room?: A Little Help needed climbing 3-5 steps with a railing? : A Lot 6 Click Score: 15    End of Session Equipment Utilized During Treatment: Gait belt Activity Tolerance: Patient tolerated treatment well Patient left: in chair;with call bell/phone within reach;with family/visitor present;with chair alarm set Nurse Communication: Mobility status PT Visit Diagnosis: Unsteadiness on feet (R26.81);Muscle weakness (generalized) (M62.81);Difficulty in walking, not elsewhere classified (R26.2);Pain     Time: 5053-9767 PT Time Calculation (min) (ACUTE ONLY): 37 min  Charges:  $Gait Training: 23-37 mins                     Lyanne Co, PT  Acute Rehab Services  Pager [phone] Office [phone]    Lawana Chambers Loreley Schwall 09/08/2019, 3:52 PM

## 2019-09-08 NOTE — Progress Notes (Signed)
OT Cancellation Note  Patient Details Name: Steven Frazier MRN: [id number] DOB: 01-27-1956   Cancelled Treatment:    Reason Eval/Treat Not Completed: OT screened, no needs identified, will sign off. Orders for scrotal sling 2/2 swelling and pain with mobility but per PT verbal report, patient ambulating with increased independence this date and demonstrating less assist for functional transfers. Patient does not currently require acute occupational therapy services with OT to sign off at this time. Please place new OT orders if indicated.   Steven Frazier OTR/L Supplemental OT, Department of rehab services [phone]  Ethell Blatchford R H. 09/08/2019, 3:14 PM

## 2019-09-08 NOTE — TOC Initial Note (Signed)
Transition of Care [facility]) - Initial/Assessment Note    Patient Details  Name: Steven Frazier MRN: [id number] Date of Birth: 1955-10-25  Transition of Care [facility] CM/SW Contact:    Curlene Labrum, RN Phone Number: 09/08/2019, 4:34 PM  Clinical Narrative:                 Case management met with the patient and daughter at the bedside regarding transitions of care.  The patient lives in an apartment with his spouse and 2 children ages 80 and 57.  The older daughter lives in the area - Steven Frazier - who was added to the face-sheet contacts.  The patient is a [place] citizen and met with SS office to determine patient's eligibility for Medicare - has appointment with SS office on August 31 to be determined.  I spoke to the daughter about home health services and the daughter would prefer outpatient PT for the patient and she is willing to transport the patient for appointments as scheduled.   Will follow for discharge needs.  Expected Discharge Plan: OP Rehab Barriers to Discharge: Continued Medical Work up   Patient Goals and CMS Choice Patient states their goals for this hospitalization and ongoing recovery are:: Patient plans to discharge home with family CMS Medicare.gov Compare Post Acute Care list provided to:: Patient Represenative (must comment) (daughter, Eugene Isadore - [phone]) Choice offered to / list presented to : Patient, Adult Children (daughter in room with patient - Steven Frazier)  Expected Discharge Plan and Services Expected Discharge Plan: OP Rehab   Discharge Planning Services: CM Consult Post Acute Care Choice: Durable Medical Equipment Living arrangements for the past 2 months: Apartment                 DME Arranged: Walker rolling DME Agency: [employer] Date DME Agency Contacted: 09/08/19 Time DME Agency Contacted: [phone] Representative spoke with at DME Agency: from [facility] - [address]            Prior Living Arrangements/Services Living arrangements for the  past 2 months: Apartment Lives with:: Adult Children, Spouse Patient language and need for interpreter reviewed:: Yes Do you feel safe going back to the place where you live?: Yes      Need for Family Participation in Patient Care: Yes (Comment) Care giver support system in place?: Yes (comment)   Criminal Activity/Legal Involvement Pertinent to Current Situation/Hospitalization: No - Comment as needed  Activities of Daily Living Home Assistive Devices/Equipment: None ADL Screening (condition at time of admission) Patient's cognitive ability adequate to safely complete daily activities?: Yes Is the patient deaf or have difficulty hearing?: No Does the patient have difficulty seeing, even when wearing glasses/contacts?: No Does the patient have difficulty concentrating, remembering, or making decisions?: No Patient able to express need for assistance with ADLs?: Yes Does the patient have difficulty dressing or bathing?: No Independently performs ADLs?: Yes (appropriate for developmental age) Does the patient have difficulty walking or climbing stairs?: Yes Weakness of Legs: Both Weakness of Arms/Hands: Both  Permission Sought/Granted Permission sought to share information with : Case Manager Permission granted to share information with : Yes, Verbal Permission Granted     Permission granted to share info w AGENCY: Outpatient PT  Permission granted to share info w Relationship: daughter     Emotional Assessment Appearance:: Appears stated age Attitude/Demeanor/Rapport: Gracious Affect (typically observed): Accepting Orientation: : Oriented to Self, Oriented to Place, Oriented to  Time, Oriented to Situation Alcohol / Substance Use:  Not Applicable Psych Involvement: No (comment)  Admission diagnosis:  Anasarca [R60.1] Bradycardia [R00.1] Hyponatremia [E87.1] Peripheral edema [R60.9] Hyponatremia with decreased serum osmolality [E87.1] Patient Active Problem List   Diagnosis  Date Noted  . Acute systolic CHF (congestive heart failure) ([place]) 09/04/2019  . Microcytic anemia 09/04/2019  . Anasarca 09/04/2019  . Hyponatremia 09/03/2019   PCP:  Karel Jarvis, NP Pharmacy:   [facility] [address] [place] [address] Phone: [phone] Fax: [phone]     Social Determinants of Health (SDOH) Interventions    Readmission Risk Interventions Readmission Risk Prevention Plan 09/08/2019  Post Dischage Appt Complete  Medication Screening Complete  Transportation Screening Complete

## 2019-09-09 DIAGNOSIS — E778 Other disorders of glycoprotein metabolism: Secondary | ICD-10-CM

## 2019-09-09 LAB — IMMUNOFIXATION ELECTROPHORESIS
IgA: 229 mg/dL (ref 61–437)
IgG (Immunoglobin G), Serum: 1466 mg/dL (ref 603–1613)
IgM (Immunoglobulin M), Srm: 69 mg/dL (ref 20–172)
Total Protein ELP: 4.8 g/dL — ABNORMAL LOW (ref 6.0–8.5)

## 2019-09-09 LAB — HGB FRACTIONATION CASCADE
Hgb A2: 3.8 % — ABNORMAL HIGH (ref 1.8–3.2)
Hgb A: 59.3 % — ABNORMAL LOW (ref 96.4–98.8)
Hgb F: 0 % (ref 0.0–2.0)
Hgb S: 36.9 % — ABNORMAL HIGH

## 2019-09-09 LAB — GLUCOSE, CAPILLARY: Glucose-Capillary: 109 mg/dL — ABNORMAL HIGH (ref 70–99)

## 2019-09-09 LAB — PATHOLOGIST SMEAR REVIEW

## 2019-09-09 LAB — HGB SOLUBILITY: Hgb Solubility: POSITIVE — AB

## 2019-09-09 MED ORDER — FUROSEMIDE 20 MG PO TABS
20.0000 mg | ORAL_TABLET | Freq: Every day | ORAL | Status: AC
  Administered 2019-09-09: 20 mg via ORAL
  Filled 2019-09-09: qty 1

## 2019-09-09 MED ORDER — LISINOPRIL 5 MG PO TABS
5.0000 mg | ORAL_TABLET | Freq: Every day | ORAL | 0 refills | Status: DC
Start: 2019-09-09 — End: 2019-11-13

## 2019-09-09 MED ORDER — FUROSEMIDE 20 MG PO TABS
20.0000 mg | ORAL_TABLET | Freq: Every day | ORAL | 0 refills | Status: DC
Start: 2019-09-09 — End: 2019-11-20

## 2019-09-09 MED ORDER — FOLIC ACID 1 MG PO TABS: 1.0000 mg | ORAL_TABLET | Freq: Every day | ORAL | 0 refills | Status: AC

## 2019-09-09 NOTE — Progress Notes (Signed)
Discharge package printed and instructions given to daughter and patient. Verbalizing understanding.

## 2019-09-09 NOTE — Progress Notes (Signed)
Phlebotomy came to get blood work, patient wanted them to come back later.

## 2019-09-09 NOTE — Discharge Instructions (Signed)
Heart Failure, Self Care Heart failure is a serious condition. This sheet explains things you need to do to take care of yourself at home. To help you stay as healthy as possible, you may be asked to change your diet, take certain medicines, and make other changes in your life. Your doctor may also give you more specific instructions. If you have problems or questions, call your doctor. What are the risks? Having heart failure makes it more likely for you to have some problems. These problems can get worse if you do not take good care of yourself. Problems may include:  Blood clotting problems. This may cause a stroke.  Damage to the kidneys, liver, or lungs.  Abnormal heart rhythms. Supplies needed:  Scale for weighing yourself.  Blood pressure monitor.  Notebook.  Medicines. How to care for yourself when you have heart failure Medicines Take over-the-counter and prescription medicines only as told by your doctor. Take your medicines every day.  Do not stop taking your medicine unless your doctor tells you to do so.  Do not skip any medicines.  Get your prescriptions refilled before you run out of medicine. This is important. Eating and drinking   Eat heart-healthy foods. Talk with a diet specialist (dietitian) to create an eating plan.  Choose foods that: ? Have no trans fat. ? Are low in saturated fat and cholesterol.  Choose healthy foods, such as: ? Fresh or frozen fruits and vegetables. ? Fish. ? Low-fat (lean) meats. ? Legumes, such as beans, peas, and lentils. ? Fat-free or low-fat dairy products. ? Whole-grain foods. ? High-fiber foods.  Limit salt (sodium) if told by your doctor. Ask your diet specialist to tell you which seasonings are healthy for your heart.  Cook in healthy ways instead of frying. Healthy ways of cooking include roasting, grilling, broiling, baking, poaching, steaming, and stir-frying.  Limit how much fluid you drink, if told by your  doctor. Alcohol use  Do not drink alcohol if: ? Your doctor tells you not to drink. ? Your heart was damaged by alcohol, or you have very bad heart failure. ? You are pregnant, may be pregnant, or are planning to become pregnant.  If you drink alcohol: ? Limit how much you use to:  0-1 drink a day for women.  0-2 drinks a day for men. ? Be aware of how much alcohol is in your drink. In the [place] one drink equals one 12 oz bottle of beer (355 mL), one 5 oz glass of wine (148 mL), or one 1 oz glass of hard liquor (44 mL). Lifestyle   Do not use any products that contain nicotine or tobacco, such as cigarettes, e-cigarettes, and chewing tobacco. If you need help quitting, ask your doctor. ? Do not use nicotine gum or patches before talking to your doctor.  Do not use illegal drugs.  Lose weight if told by your doctor.  Do physical activity if told by your doctor. Talk to your doctor before you begin an exercise if: ? You are an older adult. ? You have very bad heart failure.  Learn to manage stress. If you need help, ask your doctor.  Get rehab (rehabilitation) to help you stay independent and to help with your quality of life.  Plan time to rest when you get tired. Check weight and blood pressure   Weigh yourself every day. This will help you to know if fluid is building up in your body. ? Weigh yourself every morning   after you pee (urinate) and before you eat breakfast. ? Wear the same amount of clothing each time. ? Write down your daily weight. Give your record to your doctor.  Check and write down your blood pressure as told by your doctor.  Check your pulse as told by your doctor. Dealing with very hot and very cold weather  If it is very hot: ? Avoid activities that take a lot of energy. ? Use air conditioning or fans, or find a cooler place. ? Avoid caffeine and alcohol. ? Wear clothing that is loose-fitting, lightweight, and light-colored.  If it is very  cold: ? Avoid activities that take a lot of energy. ? Layer your clothes. ? Wear mittens or gloves, a hat, and a scarf when you go outside. ? Avoid alcohol. Follow these instructions at home:  Stay up to date with shots (vaccines). Get pneumococcal and flu (influenza) shots.  Keep all follow-up visits as told by your doctor. This is important. Contact a doctor if:  You gain weight quickly.  You have increasing shortness of breath.  You cannot do your normal activities.  You get tired easily.  You cough a lot.  You don't feel like eating or feel like you may vomit (nauseous).  You become puffy (swell) in your hands, feet, ankles, or belly (abdomen).  You cannot sleep well because it is hard to breathe.  You feel like your heart is beating fast (palpitations).  You get dizzy when you stand up. Get help right away if:  You have trouble breathing.  You or someone else notices a change in your behavior, such as having trouble staying awake.  You have chest pain or discomfort.  You pass out (faint). These symptoms may be an emergency. Do not wait to see if the symptoms will go away. Get medical help right away. Call your local emergency services (911 in the [place] Do not drive yourself to the hospital. Summary  Heart failure is a serious condition. To care for yourself, you may have to change your diet, take medicines, and make other lifestyle changes.  Take your medicines every day. Do not stop taking them unless your doctor tells you to do so.  Eat heart-healthy foods, such as fresh or frozen fruits and vegetables, fish, lean meats, legumes, fat-free or low-fat dairy products, and whole-grain or high-fiber foods.  Ask your doctor if you can drink alcohol. You may have to stop alcohol use if you have very bad heart failure.  Contact your doctor if you gain weight quickly or feel that your heart is beating too fast. Get help right away if you pass out, or have chest pain  or trouble breathing. This information is not intended to replace advice given to you by your health care provider. Make sure you discuss any questions you have with your health care provider. Document Revised: 04/29/2018 Document Reviewed: 04/30/2018 Elsevier Patient Education  2020 Elsevier Inc.  

## 2019-09-09 NOTE — Discharge Summary (Signed)
Name: Steven Frazier MRN: [id number] DOB: July 20, 1955 64 y.o. PCP: [name], NP  Date of Admission: 09/03/2019 10:17 AM Date of Discharge: 09/09/2019 Attending Physician: Tyson Alias, *  Discharge Diagnosis: 1. Hypervolemic hyponatremia 2/2 acute systolic heart failure 3. Unintentional weight loss w/ hypoproteinemia  Discharge Medications: Allergies as of 09/09/2019   No Known Allergies     Medication List    STOP taking these medications   amLODipine 10 MG tablet Commonly known as: NORVASC   lisinopril-hydrochlorothiazide 20-25 MG tablet Commonly known as: ZESTORETIC   metoprolol succinate 100 MG 24 hr tablet Commonly known as: TOPROL-XL   naproxen 500 MG tablet Commonly known as: NAPROSYN     TAKE these medications   folic acid 1 MG tablet Commonly known as: FOLVITE Take 1 tablet (1 mg total) by mouth daily. Start taking on: September 10, 2019   furosemide 20 MG tablet Commonly known as: LASIX Take 1 tablet (20 mg total) by mouth daily.   lisinopril 5 MG tablet Commonly known as: ZESTRIL Take 1 tablet (5 mg total) by mouth daily.   VITAMIN C PO Take 1 tablet by mouth daily.   zinc gluconate 50 MG tablet Take 50 mg by mouth daily.            Durable Medical Equipment  (From admission, onward)         Start     Ordered   09/09/19 1145  DME Walker  Once       Question Answer Comment  Walker: With 5 Inch Wheels   Patient needs a walker to treat with the following condition Acute systolic heart failure (HCC)      09/09/19 1147   09/08/19 1642  For home use only DME Walker rolling  Once       Question Answer Comment  Walker: With 5 Inch Wheels   Patient needs a walker to treat with the following condition Congestive heart failure (HCC)      09/08/19 1642          Disposition and follow-up:   Steven Frazier was discharged from [facility] in Stable condition.  At the hospital follow up visit please  address:  1. Hypervolemic hyponatremia 2/2 acute systolic heart failure - Admit w/ Na of 117, noted to be hypervolemic discharged at Na of 133 - Assess volume status and adjust diuretics as needed - Need cardiology referral for ischemic work-up when insured - Check bmp  2. Unintentional weight loss w/ hypoproteinemia - Noted to have significant weight loss compared to prior records - Rheumatologic work-up negative - Parasitic work-up pending at discharge please follow up results - Assess nutritional status and provide supplements as needed  2.  Labs / imaging needed at time of follow-up: cbc, bmp  3.  Pending labs/ test needing follow-up: plasmodium pcr, stronglyloides igg, vitamin B1, IFE  Follow-up Appointments:  Follow-up Information    [facility] Follow up.   Why: You will be provided with a rolling walker for your discharge to home. Contact information: [address] [address] [phone]        [facility] Follow up.   Specialty: Rehabilitation Why: Outpatient Physical Therapy department will be contacting you for your clinic appointments for therapy. Contact information: [address] [id number] mc [place] [address] [phone]       [facility] .   Specialty: Rehabilitation Contact information: [address]  [address] [id number] mc [place] [address] [phone]       [name], NP. Call.   Specialty: Nurse Practitioner Contact information: [address] [address] [phone]               Hospital Course by problem list:  Hypervolemic Hypotonic Hyponatremia likely 2/2 acute systolic heart failure. Presented with weakness for 2 weeks and history of HTN. Found to have severe hyponatremia with Na at 117 on admission with signs of hypervolemia, anasarca  and sarcopenia. HTN home medications were held. Placed on free water restriction and started on IV lasix for diuresis. Nephrotic syndrome work up was negative. 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 Cumulative net (-) output of 11.7 L and reach euvolemic state. Unfortunately, unclear etiology of this sudden HFmrEF. No history reported by family or PCP. Request sent in for records from [facility] in [place], [place]. Once received can forward those to PCP. Discharged w/ furosemide 20mg  oral, lisinopril 10mg  daily and recommendation to f/u with PCP   Unintentional weight loss. Documented unintentional weight loss of 27 lbs per PCP. Panel for inflammatory & rheumatologic disease involvement was unrevealing. No obvious malignancy. Serum protein electrophoresis c/w malnutrition. Long-standing history as vegetarian, unclear etiology of weight loss. Awaiting immunofixation electrophoresis and can forward to PCP.   Eosinophilia. CBC showed eosinophilia of unclear cause, awaiting parasitic labs to rule out reactivated parasite given his immigration status from . Will send to PCP.   Hydrocele. 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 Diuresed well and placed on scrotal support + Tyelenol PRN. Recommended home health PT and use of a rolling walker along with supervision.   LLE weakness. Suspecting to be from deconditioning or from malnutrition. Will f/u with home health PT, pending insurance. Consider imaging if symptoms worsen outpatient.    Hypoalbuminemia. Suspect this to be in setting of low protein intake from malnutrition given serum protein electrophoresis findings. Hepatitis panel c/w immunity to a natural infection of hepatitis B. Monitor for bleeds outpatient    Microcytic anemia. Labs c/w microcytic anemia  etiology. Iron studies WNL but isolated low TIBC. Vit. B12 WNL. Not c/w AoCD, suspecting thalassemia. Continue oral folate 1 mg oral daily outpatient and awaiting Hb fractionation. Team will communicate results to PCP.   Discharge Vitals:   BP (!) 92/52 (BP Location: Right Arm)   Pulse 79   Temp 98.1 F (36.7 C) (Oral)   Resp 17   Ht 5\' 5"  (1.651 m)   Wt 60 kg   SpO2 99%   BMI 22.01 kg/m   Pertinent Labs, Studies, and Procedures:  CBC Latest Ref Rng & Units 09/08/2019 09/04/2019 09/03/2019  WBC 4.0 - 10.5 K/uL 5.7 4.3 4.6  Hemoglobin 13.0 - 17.0 g/dL 12.7(L) 10.5(L) 12.0(L)  Hematocrit 39 - 52 % 36.2(L) 29.3(L) 32.9(L)  Platelets 150 - 400 K/uL 191 153 194   BMP Latest Ref Rng & Units 09/08/2019 09/07/2019 09/06/2019  Glucose 70 - 99 mg/dL 11/08/2019) 11/07/2019) 80  BUN 8 - 23 mg/dL 11/06/2019) 45(Y) 09(X)  Creatinine 0.61 - 1.24 mg/dL <8(P <3(A <2(N  Sodium 135 - 145 mmol/L 133(L) 132(L) 130(L)  Potassium 3.5 - 5.1 mmol/L 3.9 3.4(L) 3.3(L)  Chloride 98 - 111 mmol/L 94(L) 91(L) 90(L)  CO2 22 - 32 mmol/L 29 34(H) 32  Calcium 8.9 - 10.3 mg/dL 7.9(L) 7.3(L) 7.3(L)   ECHOCARDIOGRAM  IMPRESSIONS  1. Left ventricular ejection fraction, by estimation, is 40 to 45%. The  left ventricle has mild to moderately decreased function. The left  ventricle demonstrates global hypokinesis. The left ventricular internal  cavity size was moderately dilated. Left  ventricular diastolic parameters are consistent with Grade II diastolic  dysfunction (pseudonormalization).  2. Right ventricular systolic function is mildly reduced. The right  ventricular size is mildly enlarged. There is mildly elevated pulmonary  artery systolic pressure.  3. Left atrial size was severely dilated.  4. Right atrial size was moderately dilated.  5. Moderate pleural effusion in the left lateral region.  6. The mitral valve is normal in structure. Trivial mitral valve  regurgitation. No evidence of mitral stenosis.  7. The aortic  valve is tricuspid. Aortic valve regurgitation is not  visualized. No aortic stenosis is present.  8. The inferior vena cava is dilated in size with >50% respiratory  variability, suggesting right atrial pressure of 8 mmHg.   Discharge Instructions: Discharge Instructions    (HEART FAILURE PATIENTS) Call MD:  Anytime you have any of the following symptoms: 1) 3 pound weight gain in 24 hours or 5 pounds in 1 week 2) shortness of breath, with or without a dry hacking cough 3) swelling in the hands, feet or stomach 4) if you have to sleep on extra pillows at night in order to breathe.   Complete by: As directed    Ambulatory referral to Physical Therapy   Complete by: As directed    Call MD for:  difficulty breathing, headache or visual disturbances   Complete by: As directed    Call MD for:  persistant dizziness or light-headedness   Complete by: As directed    Call MD for:  persistant nausea and vomiting   Complete by: As directed    Call MD for:  temperature >100.4   Complete by: As directed    Diet - low sodium heart healthy   Complete by: As directed    Discharge instructions   Complete by: As directed    Dear Steven Frazier  You came to [place] with weakness and swelling. We have determined this was caused by heart failure. Here are our recommendations for you at discharge:  Please stop taking your amlodipine 10mg , lisinopril-hctz 20-25mg , metoprolol succinate 100mg  Please start lisnopril 5mg , furosemide 20mg  Please make sure to follow up with your regular doctor  Thank you for choosing [facility]   Increase activity slowly   Complete by: As directed      Signed: , MD 09/09/2019, 11:51 AM Pager: [phone] After 5pm on weekdays and 1pm on weekends: On Call Pager: [phone]

## 2019-09-09 NOTE — Progress Notes (Addendum)
Subjective: Hospital day62forMr. Steven Frazier, 64 y.o. malew/ PMHx of HTNwho presented with hypervolemic hypotonic hyponatremia now much improved after diuresis, etiology likely 2/2acute HFmrEF.  Patient is doing well, and ready to go home. No O/N events. Patient reports PT went well and he was ambulating. Will follow-up with outpatient rehab, pending insurance. Daughter was present at bedside, and is agreeable w/ plan. Will f/u with [facility], which is one of the local walk-in clinics.   Objective:  Vital signs in last 24 hours: Vitals:   09/08/19 2013 09/09/19 0426 09/09/19 0652 09/09/19 0802  BP: 104/64 102/67  (!) 92/52  Pulse: 84 71  79  Resp: 14 14  17   Temp: 98.2 F (36.8 C) (!) 97.5 F (36.4 C)  98.1 F (36.7 C)  TempSrc: Oral Oral  Oral  SpO2: 96% 97%  99%  Weight:   60 kg   Height:       Weight change:   Intake/Output Summary (Last 24 hours) at 09/09/2019 0901 Last data filed at 09/08/2019 2100 Gross per 24 hour  Intake 240 ml  Output 300 ml  Net -60 ml   Physical Exam: GEN: NAD, lying in bed CV: RRR, no murmurs, rubs, or gallops EXT: No edema was present on the feet  NEURO: No focal deficits PSYCH: Normal affect MSK: Normal grip strength present. Able to push and pull with good strength.   Assessment/Plan: Hospital day5forMr. 7f, 64 y.o. malewith PMHx of HTNwho presented with hypervolemic hypotonic hyponatremia now much improved after diuresis, etiology likely 2/2acute HFmrEF.  Principal Problem:   Hyponatremia Active Problems:   Acute systolic CHF (congestive heart failure) (HCC)   Microcytic anemia   Anasarca Hypervolemic Hypotonic Hyponatremia likely 2/2 acute systolic heart failure. 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 Last Na level was 133, taken 1 day ago. Team d/c'd IV lasix 60 mg and transitioned down to oral lasix 20 mg, with intention for this to be outpatient daily medication.  Net (-)60 cc yesterday, appeared to have reached euvolemic state today, planning for discharge. Etiology still unclear for this sudden HFmrEF. Waiting on thiamine to check for beriberi and team will communicate results to PCP and family. Restarting lisinopril 5 mg and can increase as needed with PCP. Not restarting HCTZ or metoprolol given patient's vitals, can f/u with PCP for adjustments. Will f/u outpatient cardiology for sudden HFmrEF. Plan: --Check vit. B1 --Continue 20 mg oral lasix 20 mg daily --Restart lisinopril 5 mg once daily, will f/u PCP for adjustments as needed --D/c'd zestoretic, will assess use of HCTZ outpatient --Holding metoprolol 100 mg 24hr, will assess plan to restart outpatient --F/u PCP --F/u outpatient cardiology   Unintentional weight loss. Documented unintentional weight loss of 27 lbs per PCP. Panel for inflammatory & rheumatologic disease involvement thus far has been unrevealing. No obvious malignancy. Only abnormality was an isolated elevation in CRP at 3.0. Recent Kappa/lambda chains were WNL. Serum protein electrophoresis showed decreased levels of total protein ELP, albumin ELP and beta globulin c/w malnutrition. Noted to have sarcopenia on admission. Still unclear what changes occurred since patient had a long history as vegetarian and stated was stable before onset of symptoms.  Plan:  --Check immunofixation electrophoresis  Eosinophilia. CBC showed eosinophilia of unclear cause. Given that this patient is originally from [birth year], team is awaiting parasitic labs to rule out reactivated parasite infection.  Plan: --Check plasmodium sp. PCR, blood smear, and strongyloides Ab IgG  Hydrocele. 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 PT saw and stated swelling had decreased & patient was able to ambulate. Recommended home health PT and use of a rolling walker along with supervision. OT spoke with PT and teams  decided his status had much improved and no scrotal sling fabrication was needed. Greatly appreciate their assistance. Plan:  --Tyelenol PRN  --Use of walker with supervision outpatient --Home health PT   LLE weakness. Suspecting to be from deconditioning or from malnutrition. Will f/u with home health PT and supervision for mobility on discharge. Consider imaging if symptoms worsen outpatient. Plan:  --Follow PT outpatient recs  Hypoalbuminemia. Suspect this to be in setting of low protein intake from malnutrition based off of recent serum protein electrophoresis findings. Hepatitis panel c/w immunity to a natural infection of hepatitis B. Plan: --Continue monitoring for bleeds outpatient   Microcytic anemia. Most recent Hb was at 12.7 with MCV of 72.4. Iron studies normal with exception of low TIBC at 218. Not c/w AoCD, suspecting thalassemia. Continue oral folate outpatient and awaiting Hb electrophoresis.Team will communicate results to PCP. Plan:  --Check Hb fractionation --Continue folate 1 mg oral daily   LOS: 6 days   Clemetine Marker, Medical Student 09/09/2019, 9:01 AM Pager: [phone]

## 2019-09-09 NOTE — Plan of Care (Signed)

## 2019-09-10 LAB — VITAMIN B1: Vitamin B1 (Thiamine): 112.5 nmol/L (ref 66.5–200.0)

## 2019-09-10 LAB — PLASMODIUM SP. PCR: Plasmodium Sp. PCR: NEGATIVE

## 2019-09-11 LAB — STRONGYLOIDES, AB, IGG: Strongyloides, Ab, IgG: NEGATIVE

## 2019-09-14 ENCOUNTER — Other Ambulatory Visit: Payer: Self-pay | Admitting: Student

## 2019-09-14 DIAGNOSIS — R531 Weakness: Secondary | ICD-10-CM

## 2019-09-22 ENCOUNTER — Encounter: Payer: Self-pay | Admitting: Student

## 2019-10-14 ENCOUNTER — Telehealth: Payer: Self-pay

## 2019-10-14 NOTE — Telephone Encounter (Signed)
NOTES ON FILE FROM [facility] [phone], SENT REFERRAL TO SCHEDULING 

## 2019-10-22 NOTE — Addendum Note (Signed)
Addended by: Dorie Rank E on: 10/22/2019 08:30 AM   Modules accepted: Orders

## 2019-10-27 ENCOUNTER — Ambulatory Visit (HOSPITAL_COMMUNITY)
Admission: EM | Admit: 2019-10-27 | Discharge: 2019-10-27 | Disposition: A | Discharge status: Home / Self Care | Payer: Medicaid Other | Attending: Urgent Care | Admitting: Urgent Care

## 2019-10-27 ENCOUNTER — Other Ambulatory Visit: Payer: Self-pay

## 2019-10-27 ENCOUNTER — Encounter (HOSPITAL_COMMUNITY): Payer: Self-pay | Admitting: Emergency Medicine

## 2019-10-27 DIAGNOSIS — N433 Hydrocele, unspecified: Secondary | ICD-10-CM

## 2019-10-27 DIAGNOSIS — N5089 Other specified disorders of the male genital organs: Secondary | ICD-10-CM

## 2019-10-27 DIAGNOSIS — R39198 Other difficulties with micturition: Secondary | ICD-10-CM

## 2019-10-27 DIAGNOSIS — R0602 Shortness of breath: Secondary | ICD-10-CM | POA: Diagnosis not present

## 2019-10-27 DIAGNOSIS — M7989 Other specified soft tissue disorders: Secondary | ICD-10-CM

## 2019-10-27 LAB — POCT URINALYSIS DIPSTICK, ED / UC
Bilirubin Urine: NEGATIVE
Glucose, UA: NEGATIVE mg/dL
Hgb urine dipstick: NEGATIVE
Ketones, ur: NEGATIVE mg/dL
Leukocytes,Ua: NEGATIVE
Nitrite: NEGATIVE
Protein, ur: NEGATIVE mg/dL
Specific Gravity, Urine: 1.02 (ref 1.005–1.030)
Urobilinogen, UA: 0.2 mg/dL (ref 0.0–1.0)
pH: 8.5 — ABNORMAL HIGH (ref 5.0–8.0)

## 2019-10-27 NOTE — ED Triage Notes (Signed)
Pt presents with leg and groin swelling. Was admitted into hospital on 09/09/19 and has follow-up with cardiologist next month, unable to get sooner appt. Daughter states pt is unable to urinate normally and is c/o swelling in groin area.

## 2019-10-27 NOTE — ED Provider Notes (Signed)
[facility]   MRN: [id number] DOB: July 19, 1955  Subjective:   Steven Frazier is a 64 y.o. male presenting for ongoing waxing and waning right-sided/groin testicle swelling and intermittent pain.  Patient states that he has been having more difficulty urinating.  Has not seen a urologist.  He does have intermittent shortness of breath, is on Lasix.  He is scheduled to see a cardiologist but has not had his appointment yet.  No current facility-administered medications for this encounter.  Current Outpatient Medications:  .  Ascorbic Acid (VITAMIN C PO), Take 1 tablet by mouth daily., Disp: , Rfl:  .  folic acid (FOLVITE) 1 MG tablet, Take 1 tablet (1 mg total) by mouth daily., Disp: 30 tablet, Rfl: 0 .  furosemide (LASIX) 20 MG tablet, Take 1 tablet (20 mg total) by mouth daily., Disp: 30 tablet, Rfl: 0 .  lisinopril (ZESTRIL) 5 MG tablet, Take 1 tablet (5 mg total) by mouth daily., Disp: 30 tablet, Rfl: 0 .  zinc gluconate 50 MG tablet, Take 50 mg by mouth daily., Disp: , Rfl:    No Known Allergies  Past Medical History:  Diagnosis Date  . Hypertension   . Hypothyroidism      Past Surgical History:  Procedure Laterality Date  . NO PAST SURGERIES      History reviewed. No pertinent family history.  Social History   Tobacco Use  . Smoking status: Never Smoker  . Smokeless tobacco: Never Used  Substance Use Topics  . Alcohol use: Not Currently  . Drug use: Never    ROS   Objective:   Vitals: BP 136/75 (BP Location: Left Arm)   Pulse 61   Temp 98.6 F (37 C) (Oral)   Resp 18   SpO2 98%   Physical Exam Constitutional:      General: He is not in acute distress.    Appearance: Normal appearance. He is well-developed and normal weight. He is not ill-appearing, toxic-appearing or diaphoretic.  HENT:     Head: Normocephalic and atraumatic.     Right Ear: External ear normal.     Left Ear: External ear normal.     Nose: Nose normal.      Mouth/Throat:     Mouth: Mucous membranes are moist.     Pharynx: Oropharynx is clear.  Eyes:     General: No scleral icterus.       Right eye: No discharge.        Left eye: No discharge.     Extraocular Movements: Extraocular movements intact.     Pupils: Pupils are equal, round, and reactive to light.  Cardiovascular:     Rate and Rhythm: Normal rate and regular rhythm.     Heart sounds: Normal heart sounds. No murmur heard.  No friction rub. No gallop.   Pulmonary:     Effort: Pulmonary effort is normal. No respiratory distress.     Breath sounds: Normal breath sounds. No stridor. No decreased breath sounds, wheezing, rhonchi or rales.     Comments: Patient challenged with walking through the clinic, did not endorse dyspnea. Genitourinary:    Testes:        Right: Swelling and testicular hydrocele present.  Musculoskeletal:     Cervical back: Normal range of motion.  Neurological:     Mental Status: He is alert and oriented to person, place, and time.  Psychiatric:        Mood and Affect: Mood normal.  Behavior: Behavior normal.        Thought Content: Thought content normal.        Judgment: Judgment normal.     Results for orders placed or performed during the hospital encounter of 10/27/19 (from the past 24 hour(s))  POC Urinalysis dipstick     Status: Abnormal   Collection Time: 10/27/19  7:39 PM  Result Value Ref Range   Glucose, UA NEGATIVE NEGATIVE mg/dL   Bilirubin Urine NEGATIVE NEGATIVE   Ketones, ur NEGATIVE NEGATIVE mg/dL   Specific Gravity, Urine 1.020 1.005 - 1.030   Hgb urine dipstick NEGATIVE NEGATIVE   pH 8.5 (H) 5.0 - 8.0   Protein, ur NEGATIVE NEGATIVE mg/dL   Urobilinogen, UA 0.2 0.0 - 1.0 mg/dL   Nitrite NEGATIVE NEGATIVE   Leukocytes,Ua NEGATIVE NEGATIVE   DG Chest 2 View  Result Date: 09/04/2019 CLINICAL DATA:  Shortness of breath. EXAM: CHEST - 2 VIEW COMPARISON:  No prior. FINDINGS: Mediastinum and hilar structures normal.  Cardiomegaly with bilateral interstitial prominence. Moderate right pleural effusion. Findings suggest CHF. Pneumonitis cannot be excluded. No pneumothorax. Degenerative changes both shoulders. No acute bony abnormality. IMPRESSION: Cardiomegaly with bilateral interstitial prominence. Small right pleural effusion. Findings suggest CHF. Electronically Signed   By: Maisie Fus  Register   On: 09/04/2019 09:11   US RENAL  Result Date: 09/03/2019 CLINICAL DATA:  Anasarca. EXAM: RENAL / URINARY TRACT ULTRASOUND COMPLETE COMPARISON:  None. FINDINGS: Right Kidney: Renal measurements: 7.9 x 3.8 x 7.9 cm. = volume: 74 mL . Echogenicity within normal limits. No mass or hydronephrosis visualized. Left Kidney: Renal measurements: 8.9 x 4.4 x 4.9 cm = volume: 111 mL. Echogenicity within normal limits. No mass or hydronephrosis visualized. Bladder: Neither ureteral jet was visualized. Other: There are bilateral pleural effusions. Subcutaneous soft tissue edema is noted. IMPRESSION: 1. No hydronephrosis. 2. Somewhat small kidneys bilaterally. 3. Bilateral pleural effusions. 4. Subcutaneous edema consistent with the reported history of anasarca. Electronically Signed   By: Katherine Mantle M.D.   On: 09/03/2019 17:27   ECHOCARDIOGRAM COMPLETE  Result Date: 09/04/2019    ECHOCARDIOGRAM REPORT   Patient Name:   Steven Frazier Date of Exam: 09/04/2019 Medical Rec #:  [id number]   Height:       65.0 in Accession #:    [id number]  Weight:       164.2 lb Date of Birth:  10-15-1955   BSA:          1.819 m Patient Age:    64 years    BP:           100/53 mmHg Patient Gender: M           HR:           47 bpm. Exam Location:  Inpatient Procedure: 2D Echo, Cardiac Doppler and Color Doppler Indications:    Volume excess [id number]  History:        Patient has no prior history of Echocardiogram examinations.                 Risk Factors:Hypertension.  Sonographer:    Elmarie Shiley Dance Referring Phys: [id number] Canary Brim TEGELER IMPRESSIONS  1. Left  ventricular ejection fraction, by estimation, is 40 to 45%. The left ventricle has mild to moderately decreased function. The left ventricle demonstrates global hypokinesis. The left ventricular internal cavity size was moderately dilated. Left ventricular diastolic parameters are consistent with Grade II diastolic dysfunction (pseudonormalization).  2. Right ventricular systolic function is mildly reduced. The  right ventricular size is mildly enlarged. There is mildly elevated pulmonary artery systolic pressure.  3. Left atrial size was severely dilated.  4. Right atrial size was moderately dilated.  5. Moderate pleural effusion in the left lateral region.  6. The mitral valve is normal in structure. Trivial mitral valve regurgitation. No evidence of mitral stenosis.  7. The aortic valve is tricuspid. Aortic valve regurgitation is not visualized. No aortic stenosis is present.  8. The inferior vena cava is dilated in size with >50% respiratory variability, suggesting right atrial pressure of 8 mmHg. FINDINGS  Left Ventricle: Left ventricular ejection fraction, by estimation, is 40 to 45%. The left ventricle has mild to moderately decreased function. The left ventricle demonstrates global hypokinesis. The left ventricular internal cavity size was moderately dilated. There is no left ventricular hypertrophy. Left ventricular diastolic parameters are consistent with Grade II diastolic dysfunction (pseudonormalization). Right Ventricle: The right ventricular size is mildly enlarged. Right ventricular systolic function is mildly reduced. There is mildly elevated pulmonary artery systolic pressure. The tricuspid regurgitant velocity is 2.99 m/s, and with an assumed right atrial pressure of 8 mmHg, the estimated right ventricular systolic pressure is 43.8 mmHg. Left Atrium: Left atrial size was severely dilated. Right Atrium: Right atrial size was moderately dilated. Pericardium: Trivial pericardial effusion is present.  Mitral Valve: The mitral valve is normal in structure. Normal mobility of the mitral valve leaflets. Trivial mitral valve regurgitation. No evidence of mitral valve stenosis. Tricuspid Valve: The tricuspid valve is normal in structure. Tricuspid valve regurgitation is mild . No evidence of tricuspid stenosis. Aortic Valve: The aortic valve is tricuspid. Aortic valve regurgitation is not visualized. No aortic stenosis is present. Pulmonic Valve: The pulmonic valve was normal in structure. Pulmonic valve regurgitation is mild. No evidence of pulmonic stenosis. Aorta: The aortic root is normal in size and structure. Venous: The inferior vena cava is dilated in size with greater than 50% respiratory variability, suggesting right atrial pressure of 8 mmHg. IAS/Shunts: No atrial level shunt detected by color flow Doppler. Additional Comments: There is a moderate pleural effusion in the left lateral region.  LEFT VENTRICLE PLAX 2D LVIDd:         6.10 cm LVIDs:         3.70 cm LV PW:         1.10 cm LV IVS:        1.00 cm LVOT diam:     2.00 cm LV SV:         49 LV SV Index:   27 LVOT Area:     3.14 cm  RIGHT VENTRICLE             IVC RV Basal diam:  3.80 cm     IVC diam: 2.90 cm RV Mid diam:    2.60 cm RV S prime:     12.70 cm/s TAPSE (M-mode): 2.0 cm LEFT ATRIUM              Index       RIGHT ATRIUM           Index LA diam:        4.50 cm  2.47 cm/m  RA Area:     29.20 cm LA Vol (A2C):   107.0 ml 58.81 ml/m RA Volume:   107.00 ml 58.81 ml/m LA Vol (A4C):   104.0 ml 57.17 ml/m LA Biplane Vol: 108.0 ml 59.36 ml/m  AORTIC VALVE LVOT Vmax:   82.55 cm/s LVOT Vmean:  53.700 cm/s LVOT VTI:    0.156 m  AORTA Ao Root diam: 3.10 cm Ao Asc diam:  3.50 cm MITRAL VALVE               TRICUSPID VALVE MV Area (PHT): 2.39 cm    TR Peak grad:   35.8 mmHg MV Decel Time: 317 msec    TR Vmax:        299.00 cm/s MV E velocity: 58.80 cm/s MV A velocity: 38.90 cm/s  SHUNTS MV E/A ratio:  1.51        Systemic VTI:  0.16 m                             Systemic Diam: 2.00 cm Olga Millers MD Electronically signed by Olga Millers MD Signature Date/Time: 09/04/2019/1:04:04 PM    Final    US SCROTUM W/DOPPLER  Result Date: 09/07/2019 CLINICAL DATA:  Scrotal swelling for 2 weeks EXAM: SCROTAL ULTRASOUND DOPPLER ULTRASOUND OF THE TESTICLES TECHNIQUE: Complete ultrasound examination of the testicles, epididymis, and other scrotal structures was performed. Color and spectral Doppler ultrasound were also utilized to evaluate blood flow to the testicles. COMPARISON:  None. FINDINGS: Right testicle Measurements: 3.3 x 2.3 x 2.1 cm. No mass or microlithiasis visualized. Left testicle Measurements: 3.4 x 2.0 x 2.7 cm. No mass or microlithiasis visualized. Right epididymis: Incidental subcentimeter epididymal cyst or spermatocele. Otherwise normal in size and appearance. Left epididymis:  Normal in size and appearance. Hydrocele:  Moderate right, small left hydroceles. Varicocele:  None visualized. Pulsed Doppler interrogation of both testes demonstrates normal low resistance arterial and venous waveforms bilaterally. IMPRESSION: 1. Moderate right, small left hydroceles. 2. The bilateral testicles proper are normal in size and appearance with symmetric bilateral arterial and venous Doppler flow. Electronically Signed   By: Lauralyn Primes M.D.   On: 09/07/2019 11:41     Assessment and Plan :   PDMP not reviewed this encounter.  1. Hydrocele, unspecified hydrocele type   2. Testicular swelling, right   3. Difficulty urinating     Recommended patient maintain his appointment for his heart failure with the cardiologist.  Maintain current medication regimen.  Patient has known hydroceles of the right testicle.  Advised that he contact [facility] as soon as possible tomorrow morning.  I do not believe the patient is an ER candidate at this time.  We discussed this at length with his daughter.  Initially we were planning on going to the emergency room  with the patient but after careful consideration with his results and discussion with his daughter we will hold off on this and have him have close follow-up with [facility]. Counseled patient on potential for adverse effects with medications prescribed/recommended today, ER and return-to-clinic precautions discussed, patient verbalized understanding.    Wallis Bamberg, [place] 10/27/19 1954

## 2019-11-10 NOTE — Progress Notes (Addendum)
Cardiology Office Note:    Date:  11/13/2019   ID:  Steven, Frazier 1955/07/08, MRN [id number]  PCP:  Norm Salt, PA  [facility] Cardiologist:  No primary care provider on file.  [facility] Electrophysiologist:  None   Referring MD: [name], NP    History of Present Illness:    Steven Frazier is a 64 y.o. male with a hx of heart failure with borderline EF (40-45%), HTN and hypothyroidism who was referred by Steven Igo, NP for evaluation of systolic heart failure.  Patient with admission to [facility] on 09/09/2019 with hypervolemic hyponatremia in the setting of systolic HF with Na 117. Also noted to have unintentional significant weight loss, low albumin, microcytic anemia. Work-up including peripheral smear, HIV, hepatitis and parasitic screens negative. No CT imaging in the system. During the admission, the patient was diuresed with lasix 60mg  IV BID with significant improvement in LE edema and Na. He was discharged home on lasix 20mg  daily and lisinopril 5mg  with plans for follow-up in Cardiology clinic for further management.  Per the patient and his daughter, prior to going to the hospital, the patient was becoming more fatigued with decreased exercise capacitance, worsening SOB, LE edema, and wheezing. No chest pain/pressure. Symptoms first noticed in June and had been getting progressively worse prompting him to go the hospital in [place]. No known inciting event in June. Moved from [place], [place] and no recent cardiac evaluations.  The patient states that since leaving the hospital in August, the fluid has re-accumulated with abdominal distension and LE edema. Very short of breath with any exertion.  Denies chest pain. Has some nausea and decreased appetite with fluid accumulation. Has been taking 20mg  lasix daily but it has not been keeping the fluid off.  Family history: No known cardiac disease  Labs:  09/08/19: Cr 0.63, Na 133, K 3.9, Mg 1.5, TSH  9.412 Vitamin 112.5, vitamin B12 884, CRP 3.0 Urine protein <6  TTE 08/2019: LVEF 40-45%, mildly depressed RV systolic function and RV is mildly enlarged, severe LAE, moderately enlarged RA, trivial MR  ECG in office today with NSR with very long 1 degree AVB (PR interval 360); poor r-wave progression, q waves in I, aVL and STD in inferior leads. No ischemic work-up in our system.   Past Medical History:  Diagnosis Date  . Heart failure (HCC)   . Hypertension   . Hyponatremia   . Hypothyroidism   . Shoulder pain     Past Surgical History:  Procedure Laterality Date  . NO PAST SURGERIES      Current Medications: Current Meds  Medication Sig  . ENTRESTO 49-51 MG Take 1 tablet by mouth 2 (two) times daily.  [name] 25 MCG tablet Take 25 mcg by mouth daily.  . folic acid (FOLVITE) 1 MG tablet Take 1 tablet (1 mg total) by mouth daily.  . furosemide (LASIX) 20 MG tablet Take 1 tablet (20 mg total) by mouth daily.     Allergies:   Patient has no known allergies.   Social History   Socioeconomic History  . Marital status: Married    Spouse name: Not on file  . Number of children: Not on file  . Years of education: Not on file  . Highest education level: Not on file  Occupational History  . Not on file  Tobacco Use  . Smoking status: Never Smoker  . Smokeless tobacco: Never Used  Substance and Sexual Activity  . Alcohol  use: Not Currently  . Drug use: Never  . Sexual activity: Not on file  Other Topics Concern  . Not on file  Social History Narrative  . Not on file   Social Determinants of Health   Financial Resource Strain:   . Difficulty of Paying Living Expenses: Not on file  Food Insecurity:   . Worried About Programme researcher, broadcasting/film/video in the Last Year: Not on file  . Ran Out of Food in the Last Year: Not on file  Transportation Needs:   . Lack of Transportation (Medical): Not on file  . Lack of Transportation (Non-Medical): Not on file  Physical Activity:     . Days of Exercise per Week: Not on file  . Minutes of Exercise per Session: Not on file  Stress:   . Feeling of Stress : Not on file  Social Connections:   . Frequency of Communication with Friends and Family: Not on file  . Frequency of Social Gatherings with Friends and Family: Not on file  . Attends Religious Services: Not on file  . Active Member of Clubs or Organizations: Not on file  . Attends Banker Meetings: Not on file  . Marital Status: Not on file     Family History: The patient's family history is not on file.  ROS:   Please see the history of present illness.    Notable for LE edema, DOE, SOB with minimal exertion, nausea, decreased appetite, fatigue, orthopnea No chest pain, palpitations, fevers or chills  EKGs/Labs/Other Studies Reviewed:    The following studies were reviewed today: TTE September 05, 2019: IMPRESSIONS  1. Left ventricular ejection fraction, by estimation, is 40 to 45%. The  left ventricle has mild to moderately decreased function. The left  ventricle demonstrates global hypokinesis. The left ventricular internal  cavity size was moderately dilated. Left  ventricular diastolic parameters are consistent with Grade II diastolic  dysfunction (pseudonormalization).  2. Right ventricular systolic function is mildly reduced. The right  ventricular size is mildly enlarged. There is mildly elevated pulmonary  artery systolic pressure.  3. Left atrial size was severely dilated.  4. Right atrial size was moderately dilated.  5. Moderate pleural effusion in the left lateral region.  6. The mitral valve is normal in structure. Trivial mitral valve  regurgitation. No evidence of mitral stenosis.  7. The aortic valve is tricuspid. Aortic valve regurgitation is not  visualized. No aortic stenosis is present.  8. The inferior vena cava is dilated in size with >50% respiratory  variability, suggesting right atrial pressure of 8 mmHg.  EKG:   EKG is ordered today.  The ekg ordered today demonstrates NSR (HR 88) with very long 1 degree AVB (PR interval 360); poor r-wave progression, q waves in I, aVL and STD in inferior leads.   Recent Labs: 09/03/2019: TSH 9.412 09/07/2019: ALT 13 09/08/2019: BUN <5; Creatinine, Ser 0.63; Hemoglobin 12.7; Magnesium 1.4; Magnesium 1.5; Platelets 191; Potassium 3.9; Sodium 133  Recent Lipid Panel    Component Value Date/Time   CHOL 102 09-05-19 0333   TRIG 37 09-05-19 0333   HDL 41 05-Sep-2019 0333   CHOLHDL 2.5 05-Sep-2019 0333   VLDL 7 09-05-19 0333   LDLCALC 54 September 05, 2019 0333      Physical Exam:    VS:  BP 140/90   Pulse 92   Ht 5\' 5"  (1.651 m)   Wt 155 lb 12.8 oz (70.7 kg)   SpO2 95%   BMI 25.93 kg/m  Wt Readings from Last 3 Encounters:  11/13/19 155 lb (70.3 kg)  11/13/19 155 lb 12.8 oz (70.7 kg)  09/09/19 132 lb 4.4 oz (60 kg)     GEN: elderly male, ill appearing but no acute distress HEENT: Normal NECK: JVP to the ear when sitting upright at 90 degrees CARDIAC: RR, +S3, no gallops RESPIRATORY:  Crackles at the bases bilaterally ABDOMEN: Soft, mildly distended MUSCULOSKELETAL:  2+ tight edema to the thigh.  SKIN: Warm with skin breakdown on the left thigh after a fall NEUROLOGIC:  Alert and oriented x 3 PSYCHIATRIC:  Normal affect   ASSESSMENT:    1. Acute systolic CHF (congestive heart failure) (HCC)   2. Hyponatremia    PLAN:    In order of problems listed above:  #Heart Failure with Borderline EF (40-45%) Patient with recent admission for hypervolemic hyponatremia with significant volume overload.TTE with LVEF 40-45% with RV dysfunction and LAE/RAE. ECG with lateral q-waves and TWI in inferior leads concerning for ischemia. No ischemic work-up done on the at admission. No BNP in the system.  Notably, with low albumin and significant weight loss over the past several months. Concern that patient may have udnerlying malignancy or systemic condition as degree  of edema is out of proportion to EF. No protein in urine to suggest nephrotic syndrome. TSH high at 9 and patient has since been started on euthyrox. On exam today, patient is very grossly overloaded with JVP to earlobe when sitting upright. 2+ pitting edema to the thighs. Very SOB with minimal exertion. Will admit to the hospital for IV diuresis and further work-up of his underlying HF. -Admit to [id number] for acute on chronic HF exacerbation with NYHA class III symptoms and gross volume overload -Needs IV diuresis -Once more euvolemic, plan for LHC/RHC for further evaluation as patient has evidence of prior MI on ecg and no ischemic work-up in system -? 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 No ischemic work-up in our system however patient with decompensated HF currently. Very long PR interval. -Start ASA 81mg  daily -Start crestor 10mg  daily -Needs LHC/RHC once more euvolemic for ischemic work-up -Very long 1st degree block concerning for both HF and conduction disease. May consider sarcoid work-up if above work-up unrevealing.  #Unintentional weight loss: Patient with reported 27lbs unintentional weight loss. Highly concerning for underlying malignancy. Peripheral smear unrevealing. No CT scans in the system. -CT chest/abdomen/pelvis -Needs colonoscopy  #Anasarca: Out-of-proportion to heart failure with EF 40-45%. No nephrotic syndrome as no protein in urine. TSH notably elevated with concern that hypothyroidism may be contributing. Also with concern for malignancy as above. -Manage HF as above -Needs ischemia work-up -? Malignancy work-up with CT imaging  #Hypothyroidism: TSH 9.412 on hospital  admission. -Repeat TSH, free T4 -Continue euthyrox   Medication Adjustments/Labs and Tests Ordered: Current medicines are reviewed at length with the patient today.  Concerns regarding medicines are outlined above.  Orders Placed This Encounter  Procedures  . EKG 12-Lead   No orders of the defined types were placed in this encounter.   Patient Instructions  Medication Instructions:   Your physician recommends that you continue on your current medications as directed. Please refer to the Current Medication list given to you today.  *  If you need a refill on your cardiac medications before your next appointment, please call your pharmacy*   Follow-Up:  Will be arranged post-discharge from the hospital.    Other Instructions  Please go straight to [facility] now, to get evaluation and work-up.   Do the following things EVERY DAY:  1) Weigh yourself EVERY morning after you go to the bathroom but before you eat or drink anything. Write this number down in a weight log/diary. If you gain 3 pounds overnight or 5 pounds in a week, call the office.  2) Take your medicines as prescribed. If you have concerns about your medications, please call us before you stop taking them.   3) Eat low salt foods--Limit salt (sodium) to 2000 mg per day. This will help prevent your body from holding onto fluid. Read food labels as many processed foods have a lot of sodium, especially canned goods and prepackaged meats. If you would like some assistance choosing low sodium foods, we would be happy to set you up with a nutritionist.  4) Stay as active as you can everyday. Staying active will give you more energy and make your muscles stronger. Start with 5 minutes at a time and work your way up to 30 minutes a day. Break up your activities--do some in the morning and some in the afternoon. Start with 3 days per week and work your way up to 5 days as you can.  If you have chest pain, feel  short of breath, dizzy, or lightheaded, STOP. If you don't feel better after a short rest, call 911. If you do feel better, call the office to let us know you have symptoms with exercise.  5) Limit all fluids for the day to less than 2 liters. Fluid includes all drinks, coffee, juice, ice chips, soup, jello, and all other liquids.     Signed, Meriam Sprague, MD  11/13/2019 3:31 PM    [facility]

## 2019-11-13 ENCOUNTER — Inpatient Hospital Stay (HOSPITAL_COMMUNITY)
Admission: EM | Admit: 2019-11-13 | Discharge: 2019-11-20 | DRG: 286 | Disposition: A | Discharge status: Home / Self Care | Payer: Medicaid Other | Attending: Cardiology | Admitting: Cardiology

## 2019-11-13 ENCOUNTER — Emergency Department (HOSPITAL_COMMUNITY): Payer: Medicaid Other

## 2019-11-13 ENCOUNTER — Ambulatory Visit (INDEPENDENT_AMBULATORY_CARE_PROVIDER_SITE_OTHER): Payer: Medicaid Other | Admitting: Cardiology

## 2019-11-13 ENCOUNTER — Other Ambulatory Visit: Payer: Self-pay

## 2019-11-13 ENCOUNTER — Encounter (HOSPITAL_COMMUNITY): Payer: Self-pay | Admitting: Emergency Medicine

## 2019-11-13 ENCOUNTER — Encounter: Payer: Self-pay | Admitting: Cardiology

## 2019-11-13 VITALS — BP 140/90 | HR 92 | Ht 65.0 in | Wt 155.8 lb

## 2019-11-13 DIAGNOSIS — Z79899 Other long term (current) drug therapy: Secondary | ICD-10-CM

## 2019-11-13 DIAGNOSIS — E039 Hypothyroidism, unspecified: Secondary | ICD-10-CM | POA: Diagnosis present

## 2019-11-13 DIAGNOSIS — I5023 Acute on chronic systolic (congestive) heart failure: Secondary | ICD-10-CM | POA: Diagnosis present

## 2019-11-13 DIAGNOSIS — E871 Hypo-osmolality and hyponatremia: Secondary | ICD-10-CM

## 2019-11-13 DIAGNOSIS — I440 Atrioventricular block, first degree: Secondary | ICD-10-CM | POA: Diagnosis present

## 2019-11-13 DIAGNOSIS — I428 Other cardiomyopathies: Secondary | ICD-10-CM | POA: Diagnosis present

## 2019-11-13 DIAGNOSIS — R64 Cachexia: Secondary | ICD-10-CM | POA: Diagnosis present

## 2019-11-13 DIAGNOSIS — E43 Unspecified severe protein-calorie malnutrition: Secondary | ICD-10-CM | POA: Diagnosis present

## 2019-11-13 DIAGNOSIS — I110 Hypertensive heart disease with heart failure: Secondary | ICD-10-CM | POA: Diagnosis present

## 2019-11-13 DIAGNOSIS — Z6820 Body mass index (BMI) 20.0-20.9, adult: Secondary | ICD-10-CM | POA: Diagnosis not present

## 2019-11-13 DIAGNOSIS — Z79890 Hormone replacement therapy (postmenopausal): Secondary | ICD-10-CM | POA: Diagnosis not present

## 2019-11-13 DIAGNOSIS — Z20822 Contact with and (suspected) exposure to covid-19: Secondary | ICD-10-CM | POA: Diagnosis present

## 2019-11-13 DIAGNOSIS — E440 Moderate protein-calorie malnutrition: Secondary | ICD-10-CM | POA: Insufficient documentation

## 2019-11-13 DIAGNOSIS — Z23 Encounter for immunization: Secondary | ICD-10-CM | POA: Diagnosis not present

## 2019-11-13 DIAGNOSIS — D721 Eosinophilia, unspecified: Secondary | ICD-10-CM | POA: Diagnosis present

## 2019-11-13 DIAGNOSIS — D509 Iron deficiency anemia, unspecified: Secondary | ICD-10-CM | POA: Diagnosis present

## 2019-11-13 DIAGNOSIS — E876 Hypokalemia: Secondary | ICD-10-CM | POA: Diagnosis present

## 2019-11-13 DIAGNOSIS — I252 Old myocardial infarction: Secondary | ICD-10-CM

## 2019-11-13 DIAGNOSIS — I5021 Acute systolic (congestive) heart failure: Secondary | ICD-10-CM | POA: Diagnosis not present

## 2019-11-13 DIAGNOSIS — E2740 Unspecified adrenocortical insufficiency: Secondary | ICD-10-CM | POA: Diagnosis present

## 2019-11-13 DIAGNOSIS — R634 Abnormal weight loss: Secondary | ICD-10-CM

## 2019-11-13 DIAGNOSIS — I509 Heart failure, unspecified: Secondary | ICD-10-CM | POA: Diagnosis not present

## 2019-11-13 LAB — CBC WITH DIFFERENTIAL/PLATELET
Abs Immature Granulocytes: 0.01 10*3/uL (ref 0.00–0.07)
Basophils Absolute: 0 10*3/uL (ref 0.0–0.1)
Basophils Relative: 1 %
Eosinophils Absolute: 0.9 10*3/uL — ABNORMAL HIGH (ref 0.0–0.5)
Eosinophils Relative: 14 %
HCT: 35.7 % — ABNORMAL LOW (ref 39.0–52.0)
Hemoglobin: 11.5 g/dL — ABNORMAL LOW (ref 13.0–17.0)
Immature Granulocytes: 0 %
Lymphocytes Relative: 37 %
Lymphs Abs: 2.5 10*3/uL (ref 0.7–4.0)
MCH: 24.8 pg — ABNORMAL LOW (ref 26.0–34.0)
MCHC: 32.2 g/dL (ref 30.0–36.0)
MCV: 76.9 fL — ABNORMAL LOW (ref 80.0–100.0)
Monocytes Absolute: 0.7 10*3/uL (ref 0.1–1.0)
Monocytes Relative: 10 %
Neutro Abs: 2.5 10*3/uL (ref 1.7–7.7)
Neutrophils Relative %: 38 %
Platelets: 359 10*3/uL (ref 150–400)
RBC: 4.64 MIL/uL (ref 4.22–5.81)
RDW: 18.1 % — ABNORMAL HIGH (ref 11.5–15.5)
WBC: 6.6 10*3/uL (ref 4.0–10.5)
nRBC: 0 % (ref 0.0–0.2)

## 2019-11-13 LAB — COMPREHENSIVE METABOLIC PANEL
ALT: 18 U/L (ref 0–44)
AST: 31 U/L (ref 15–41)
Albumin: 2.5 g/dL — ABNORMAL LOW (ref 3.5–5.0)
Alkaline Phosphatase: 115 U/L (ref 38–126)
Anion gap: 8 (ref 5–15)
BUN: <5 mg/dL — ABNORMAL LOW (ref 8–23)
CO2: 29 mmol/L (ref 22–32)
Calcium: 8 mg/dL — ABNORMAL LOW (ref 8.9–10.3)
Chloride: 99 mmol/L (ref 98–111)
Creatinine, Ser: 0.81 mg/dL (ref 0.61–1.24)
GFR, Estimated: >60 mL/min (ref >60)
Glucose, Bld: 111 mg/dL — ABNORMAL HIGH (ref 70–99)
Potassium: 3.6 mmol/L (ref 3.5–5.1)
Sodium: 136 mmol/L (ref 135–145)
Total Bilirubin: 0.6 mg/dL (ref 0.3–1.2)
Total Protein: 6 g/dL — ABNORMAL LOW (ref 6.5–8.1)

## 2019-11-13 LAB — CBC
HCT: 38.2 % — ABNORMAL LOW (ref 39.0–52.0)
Hemoglobin: 12.5 g/dL — ABNORMAL LOW (ref 13.0–17.0)
MCH: 24.9 pg — ABNORMAL LOW (ref 26.0–34.0)
MCHC: 32.7 g/dL (ref 30.0–36.0)
MCV: 75.9 fL — ABNORMAL LOW (ref 80.0–100.0)
Platelets: 379 10*3/uL (ref 150–400)
RBC: 5.03 MIL/uL (ref 4.22–5.81)
RDW: 18.2 % — ABNORMAL HIGH (ref 11.5–15.5)
WBC: 6.9 10*3/uL (ref 4.0–10.5)
nRBC: 0 % (ref 0.0–0.2)

## 2019-11-13 LAB — BRAIN NATRIURETIC PEPTIDE: B Natriuretic Peptide: >4500 pg/mL — ABNORMAL HIGH (ref 0.0–100.0)

## 2019-11-13 LAB — RESPIRATORY PANEL BY RT PCR (FLU A&B, COVID)
Influenza A by PCR: NEGATIVE
Influenza B by PCR: NEGATIVE
SARS Coronavirus 2 by RT PCR: NEGATIVE

## 2019-11-13 LAB — TROPONIN I (HIGH SENSITIVITY)
Troponin I (High Sensitivity): 4 ng/L (ref <18)
Troponin I (High Sensitivity): 5 ng/L (ref <18)

## 2019-11-13 LAB — TSH: TSH: 6.26 u[IU]/mL — ABNORMAL HIGH (ref 0.350–4.500)

## 2019-11-13 LAB — CREATININE, SERUM
Creatinine, Ser: 0.84 mg/dL (ref 0.61–1.24)
GFR, Estimated: >60 mL/min (ref >60)

## 2019-11-13 LAB — T4, FREE: Free T4: 0.76 ng/dL (ref 0.61–1.12)

## 2019-11-13 IMAGING — DX DG CHEST 2V
2 series · 2 of 2 positions shown · non-contrast
Comparison: [DATE]

CLINICAL DATA: Shortness of breath. Cough, hypertension. Swelling
in groin and bilateral legs.

EXAM:
CHEST - 2 VIEW

[chest pa]
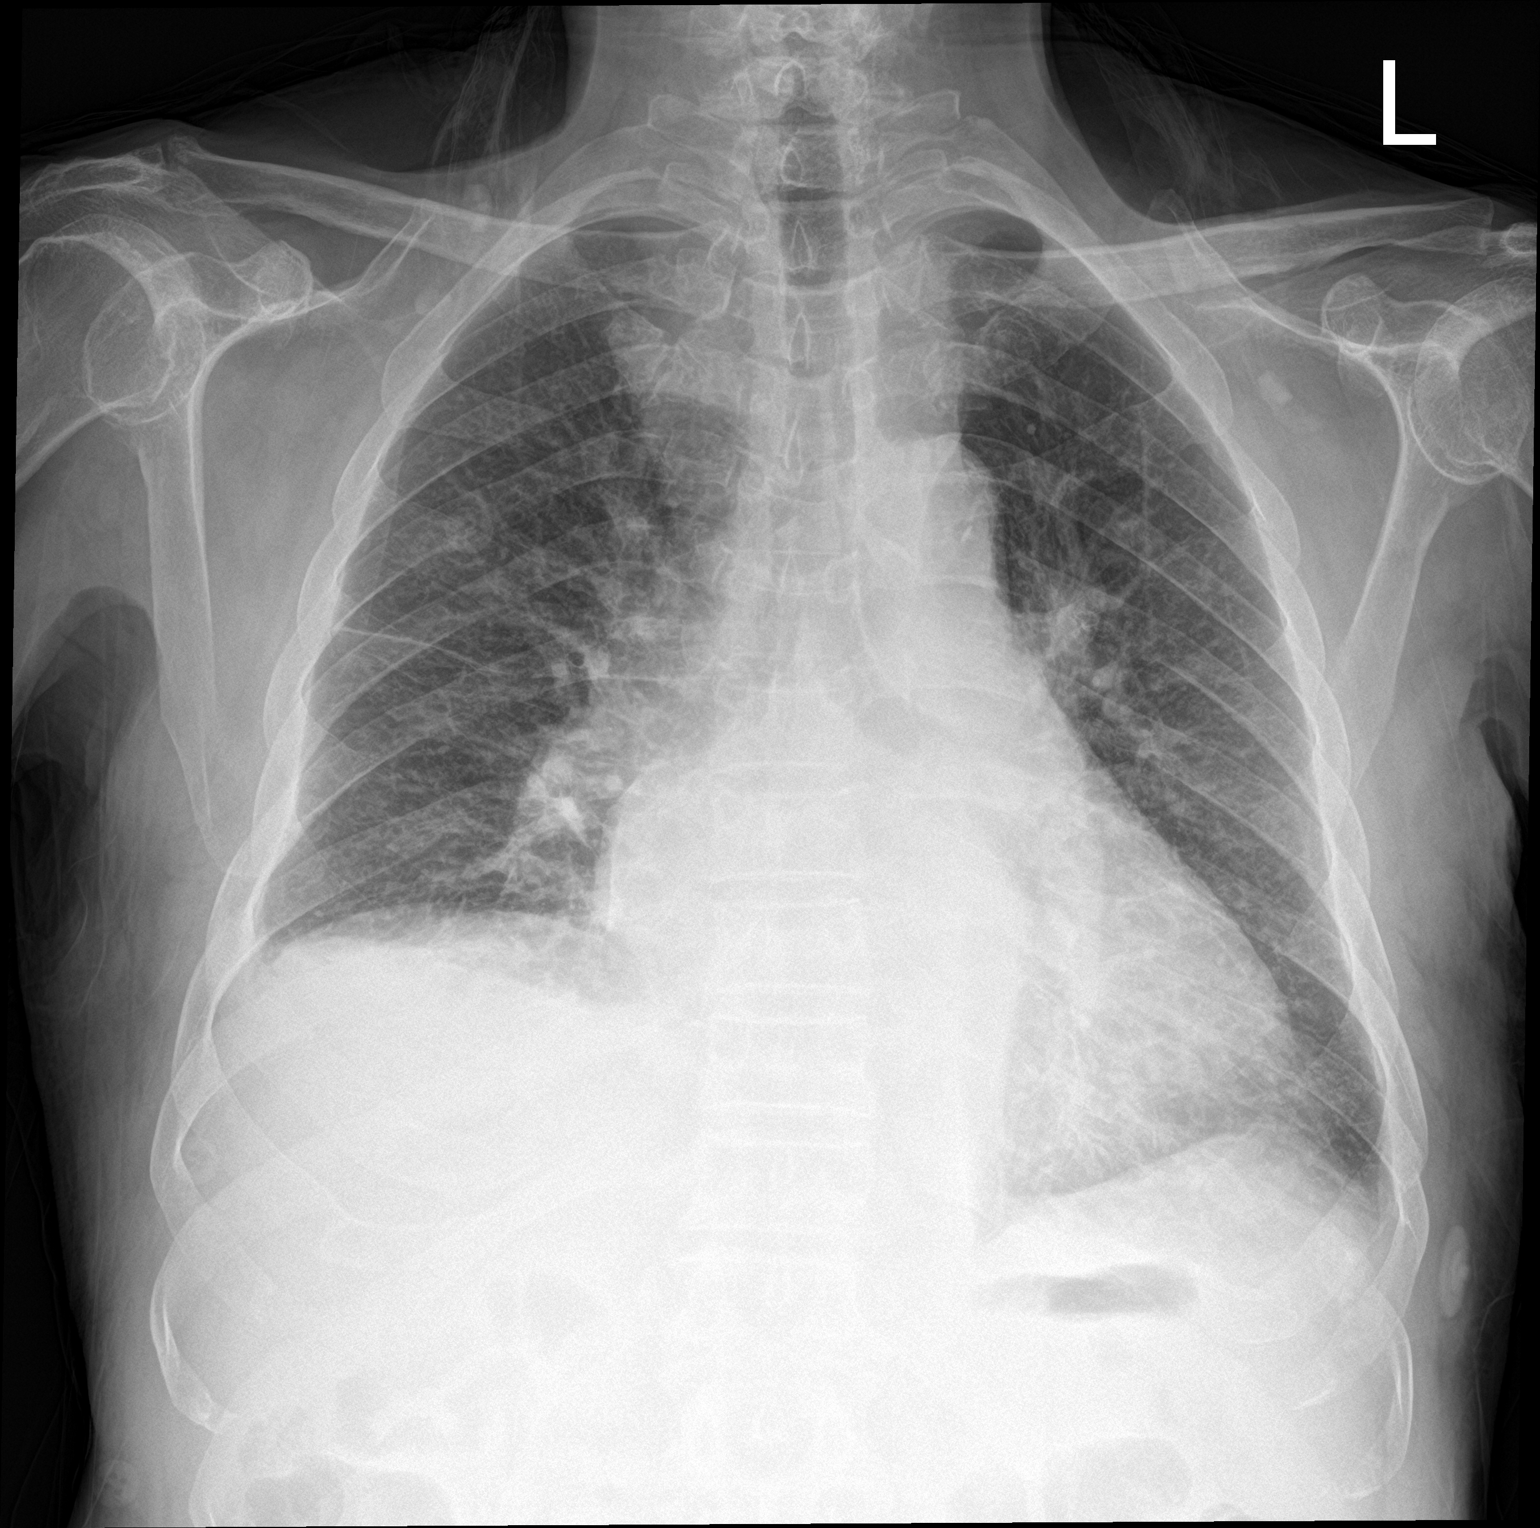

[chest lat]
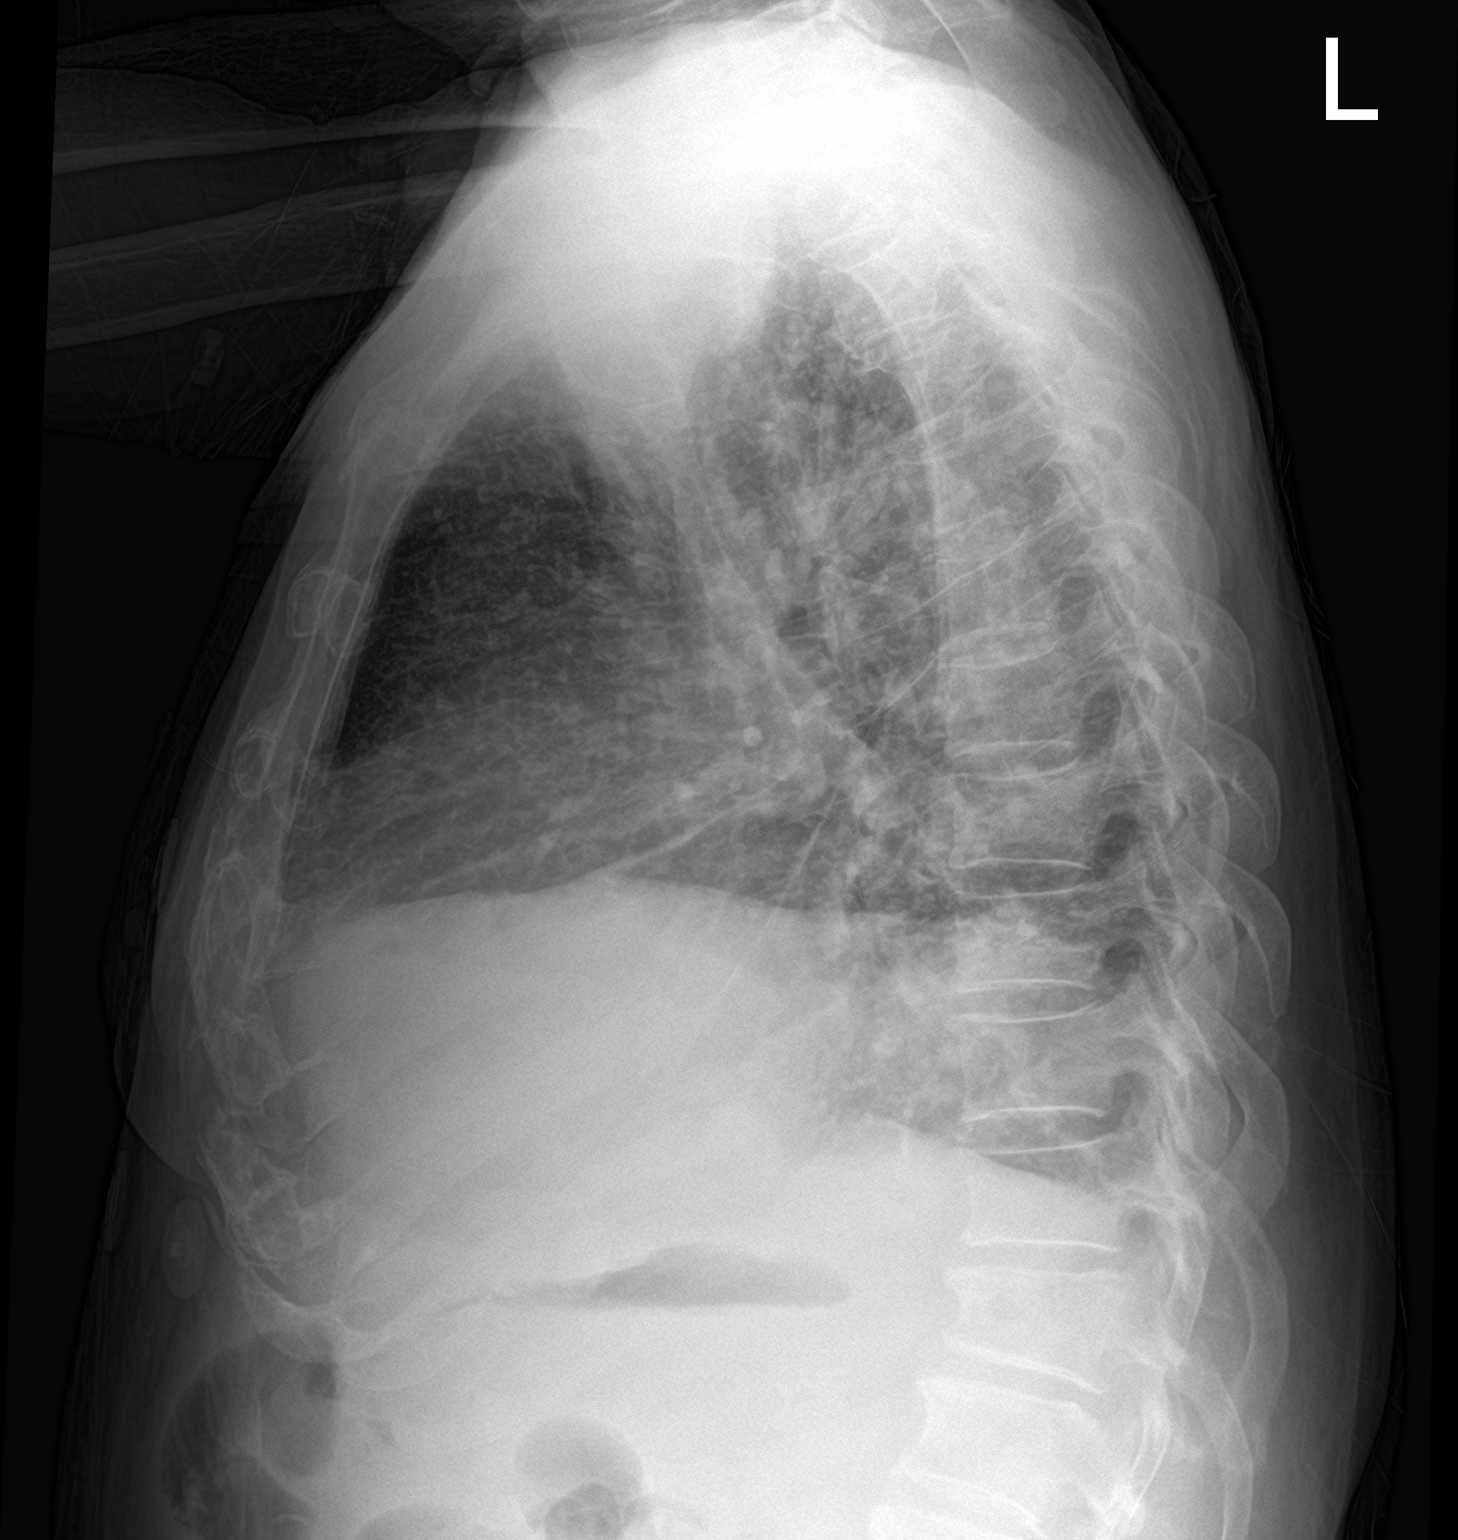

[2 of 2 positions shown; findings below may reference images not displayed]

FINDINGS: Mild diffuse interstitial prominence. Small right pleural effusion.
No discernible pneumothorax. Enlarged cardiac silhouette. Aortic
atherosclerosis. Pulmonary vascular congestion. No confluent
consolidation. Polyarticular degenerative change. Remote lower
thoracic compression fracture without progressive height loss.
IMPRESSION: Cardiomegaly with likely diffuse interstitial edema and a small
right pleural effusion. Findings are suggestive of congestive heart
failure.

## 2019-11-13 MED ORDER — LEVOTHYROXINE SODIUM 25 MCG PO TABS
25.0000 ug | ORAL_TABLET | Freq: Every day | ORAL | Status: DC
  Administered 2019-11-14 – 2019-11-15 (×2): 25 ug via ORAL
  Filled 2019-11-13 (×2): qty 1

## 2019-11-13 MED ORDER — SODIUM CHLORIDE 0.9% FLUSH
3.0000 mL | Freq: Two times a day (BID) | INTRAVENOUS | Status: DC
  Administered 2019-11-14 – 2019-11-18 (×8): 3 mL via INTRAVENOUS

## 2019-11-13 MED ORDER — ONDANSETRON HCL 4 MG/2ML IJ SOLN: 4.0000 mg | Freq: Four times a day (QID) | INTRAMUSCULAR | Status: DC | PRN

## 2019-11-13 MED ORDER — SODIUM CHLORIDE 0.9 % IV SOLN: 250.0000 mL | INTRAVENOUS | Status: DC | PRN

## 2019-11-13 MED ORDER — ASPIRIN EC 81 MG PO TBEC
81.0000 mg | DELAYED_RELEASE_TABLET | Freq: Every day | ORAL | Status: DC
  Administered 2019-11-14 – 2019-11-20 (×7): 81 mg via ORAL
  Filled 2019-11-13 (×7): qty 1

## 2019-11-13 MED ORDER — FOLIC ACID 1 MG PO TABS
1.0000 mg | ORAL_TABLET | Freq: Every day | ORAL | Status: DC
  Administered 2019-11-14 – 2019-11-20 (×7): 1 mg via ORAL
  Filled 2019-11-13 (×7): qty 1

## 2019-11-13 MED ORDER — ROSUVASTATIN CALCIUM 5 MG PO TABS
10.0000 mg | ORAL_TABLET | Freq: Every day | ORAL | Status: DC
  Administered 2019-11-14 – 2019-11-20 (×7): 10 mg via ORAL
  Filled 2019-11-13 (×7): qty 2

## 2019-11-13 MED ORDER — SACUBITRIL-VALSARTAN 49-51 MG PO TABS
1.0000 | ORAL_TABLET | Freq: Two times a day (BID) | ORAL | Status: DC
  Administered 2019-11-13 – 2019-11-20 (×14): 1 via ORAL
  Filled 2019-11-13 (×14): qty 1

## 2019-11-13 MED ORDER — FUROSEMIDE 10 MG/ML IJ SOLN
60.0000 mg | Freq: Two times a day (BID) | INTRAMUSCULAR | Status: DC
  Administered 2019-11-14 – 2019-11-16 (×6): 60 mg via INTRAVENOUS
  Filled 2019-11-13 (×6): qty 6

## 2019-11-13 MED ORDER — ENOXAPARIN SODIUM 40 MG/0.4ML ~~LOC~~ SOLN
40.0000 mg | SUBCUTANEOUS | Status: DC
  Administered 2019-11-14 – 2019-11-15 (×2): 40 mg via SUBCUTANEOUS
  Filled 2019-11-13 (×3): qty 0.4

## 2019-11-13 MED ORDER — SODIUM CHLORIDE 0.9% FLUSH: 3.0000 mL | INTRAVENOUS | Status: DC | PRN

## 2019-11-13 MED ORDER — ACETAMINOPHEN 325 MG PO TABS
650.0000 mg | ORAL_TABLET | ORAL | Status: DC | PRN
  Administered 2019-11-19 – 2019-11-20 (×3): 650 mg via ORAL
  Filled 2019-11-13 (×3): qty 2

## 2019-11-13 NOTE — H&P (Addendum)
Cardiology Admission History and Physical:   Patient ID: Steven Frazier MRN: [id number]; DOB: 04-03-1955   Admission date: 11/13/2019  Primary Care Provider: Norm Salt, PA [facility] Cardiologist: Laurance Flatten, MD [facility] Electrophysiologist: None  Chief Complaint:  Shortness of breath; Acute on chronic HF  Patient Profile:   Steven Frazier is a 64 y.o. male with history of HF with borderline EF (40-45%), HTN and hypothroidism who presented to clinic with worsening shortness of breath, LE edema, abdominal distension and orthopnea found to be grossly volume overloaded with NYHA class III symptoms now admitted for acute on chronic HF exacerbation.  History of Present Illness:   Steven Frazier is a 64 y.o. male with history of HF with borderline EF (40-45%), HTN, and hypothroidism who presented to clinic with worsening shortness of breath, LE edema, abdominal distension and orthopnea found to be grossly volume overloaded with NYHA class III symptoms now admitted for acute on chronic HF exacerbation.  The patient was recently admission to [facility] on 09/09/2019 with hypervolemic hyponatremia in the setting of systolic HF with Na 117. Also noted to have unintentional significant weight loss, low albumin, microcytic anemia. Work-up including peripheral smear, HIV, hepatitis and parasitic screens negative. No CT imaging in the system. During the admission, the patient was diuresed with lasix 60mg  IV BID with significant improvement in LE edema and Na. He was discharged home on lasix 20mg  daily and lisinopril 5mg  with plans for follow-up in Cardiology clinic for further management.  Per the patient and his daughter, prior to going to the hospital, the patient was becoming more fatigued with decreased exercise capacitance, worsening SOB, LE edema, and wheezing. No chest pain/pressure. Symptoms first noticed in June and had been getting progressively worse prompting him to go the  hospital in [place]. No known inciting event in June. Moved from [place], July and no recent cardiac evaluations.  Since being discharged, the patient states that the fluid has re-accumulated with abdominal distension and LE edema. Very short of breath with any exertion.  Denies chest pain. Has some nausea and decreased appetite with fluid accumulation. Has been taking 20mg  lasix daily but it has not been keeping the fluid off.  Family history: No known cardiac disease  Labs:  09/08/19: Cr 0.63, Na 133, K 3.9, Mg 1.5, TSH 9.412 Vitamin 112.5, vitamin B12 884, CRP 3.0 Urine protein <6  TTE 08/2019: LVEF 40-45%, mildly depressed RV systolic function and RV is mildly enlarged, severe LAE, moderately enlarged RA, trivial MR  ECG in office today with NSR with very long 1 degree AVB (PR interval 360); poor r-wave progression, q waves in I, aVL and STD in inferior leads. No ischemic work-up in our system.   Given concern for worsening HF with NYHA class III symptoms, the decision was made to admit to [facility] hospital for further management.   Past Medical History:  Diagnosis Date  . Heart failure (HCC)   . Hypertension   . Hyponatremia   . Hypothyroidism   . Shoulder pain     Past Surgical History:  Procedure Laterality Date  . NO PAST SURGERIES       Medications Prior to Admission: Prior to Admission medications   Medication Sig Start Date End Date Taking? Authorizing Provider  ENTRESTO 49-51 MG Take 1 tablet by mouth 2 (two) times daily. 10/30/19   [provider]  EUTHYROX 25 MCG tablet Take 25 mcg by mouth daily. 11/03/19   [provider]  folic acid (FOLVITE)  1 MG tablet Take 1 tablet (1 mg total) by mouth daily. 09/10/19   Theotis Barrio, MD  furosemide (LASIX) 20 MG tablet Take 1 tablet (20 mg total) by mouth daily. 09/09/19   Theotis Barrio, MD     Allergies:   No Known Allergies  Social History:   Social History   Socioeconomic History  . Marital status:  Married    Spouse name: Not on file  . Number of children: Not on file  . Years of education: Not on file  . Highest education level: Not on file  Occupational History  . Not on file  Tobacco Use  . Smoking status: Never Smoker  . Smokeless tobacco: Never Used  Substance and Sexual Activity  . Alcohol use: Not Currently  . Drug use: Never  . Sexual activity: Not on file  Other Topics Concern  . Not on file  Social History Narrative  . Not on file   Social Determinants of Health   Financial Resource Strain:   . Difficulty of Paying Living Expenses: Not on file  Food Insecurity:   . Worried About Programme researcher, broadcasting/film/video in the Last Year: Not on file  . Ran Out of Food in the Last Year: Not on file  Transportation Needs:   . Lack of Transportation (Medical): Not on file  . Lack of Transportation (Non-Medical): Not on file  Physical Activity:   . Days of Exercise per Week: Not on file  . Minutes of Exercise per Session: Not on file  Stress:   . Feeling of Stress : Not on file  Social Connections:   . Frequency of Communication with Friends and Family: Not on file  . Frequency of Social Gatherings with Friends and Family: Not on file  . Attends Religious Services: Not on file  . Active Member of Clubs or Organizations: Not on file  . Attends Banker Meetings: Not on file  . Marital Status: Not on file  Intimate Partner Violence:   . Fear of Current or Ex-Partner: Not on file  . Emotionally Abused: Not on file  . Physically Abused: Not on file  . Sexually Abused: Not on file    Family History:   No known family history of CVD per patient and his daughter  ROS:  Please see the history of present illness.    Notable for LE edema, DOE, SOB with minimal exertion, nausea, decreased appetite, fatigue, orthopnea No chest pain, palpitations, fevers or chills   Physical Exam/Data:   Vitals:   11/13/19 1511 11/13/19 1549  BP: (!) 142/102 (!) 142/99  Pulse: 86 85   Resp: 18 18  Temp: 98.3 F (36.8 C)   TempSrc: Oral   SpO2: 91% 93%  Weight: 70.3 kg   Height: 5\' 5"  (1.651 m)    No intake or output data in the 24 hours ending 11/13/19 1617 Last 3 Weights 11/13/2019 11/13/2019 09/09/2019  Weight (lbs) 155 lb 155 lb 12.8 oz 132 lb 4.4 oz  Weight (kg) 70.308 kg 70.67 kg 60 kg     Body mass index is 25.79 kg/m.  GEN: elderly male, ill appearing but no acute distress HEENT: Normal NECK: JVP to the ear when sitting upright at 90 degrees CARDIAC: RR, +S3, no gallops RESPIRATORY:  Crackles at the bases bilaterally ABDOMEN: Soft, mildly distended MUSCULOSKELETAL:  2+ tight edema to the thigh.  SKIN: Warm with skin breakdown on the left thigh after a fall NEUROLOGIC:  Alert and  oriented x 3 PSYCHIATRIC:  Normal affect    Relevant CV Studies: TTE 09/04/2019: IMPRESSIONS  1. Left ventricular ejection fraction, by estimation, is 40 to 45%. The  left ventricle has mild to moderately decreased function. The left  ventricle demonstrates global hypokinesis. The left ventricular internal  cavity size was moderately dilated. Left  ventricular diastolic parameters are consistent with Grade II diastolic  dysfunction (pseudonormalization).  2. Right ventricular systolic function is mildly reduced. The right  ventricular size is mildly enlarged. There is mildly elevated pulmonary  artery systolic pressure.  3. Left atrial size was severely dilated.  4. Right atrial size was moderately dilated.  5. Moderate pleural effusion in the left lateral region.  6. The mitral valve is normal in structure. Trivial mitral valve  regurgitation. No evidence of mitral stenosis.  7. The aortic valve is tricuspid. Aortic valve regurgitation is not  visualized. No aortic stenosis is present.  8. The inferior vena cava is dilated in size with >50% respiratory  variability, suggesting right atrial pressure of 8 mmHg.  EKG:  The ekg ordered today demonstrates NSR  (HR 88) with very long 1 degree AVB (PR interval 360); poor r-wave progression, q waves in I, aVL and STD in inferior leads.    Laboratory Data:  High Sensitivity Troponin:  No results for input(s): TROPONINIHS in the last 720 hours.    ChemistryNo results for input(s): NA, K, CL, CO2, GLUCOSE, BUN, CREATININE, CALCIUM, GFRNONAA, GFRAA, ANIONGAP in the last 168 hours.  No results for input(s): PROT, ALBUMIN, AST, ALT, ALKPHOS, BILITOT in the last 168 hours. Hematology Recent Labs  Lab 11/13/19 1527  WBC 6.6  RBC 4.64  HGB 11.5*  HCT 35.7*  MCV 76.9*  MCH 24.8*  MCHC 32.2  RDW 18.1*  PLT 359   BNPNo results for input(s): BNP, PROBNP in the last 168 hours.  DDimer No results for input(s): DDIMER in the last 168 hours.   Radiology/Studies:  DG Chest 2 View  Result Date: 11/13/2019 CLINICAL DATA:  Shortness of breath. Cough, hypertension. Swelling in groin and bilateral legs. EXAM: CHEST - 2 VIEW COMPARISON:  09/04/2019 FINDINGS: Mild diffuse interstitial prominence. Small right pleural effusion. No discernible pneumothorax. Enlarged cardiac silhouette. Aortic atherosclerosis. Pulmonary vascular congestion. No confluent consolidation. Polyarticular degenerative change. Remote lower thoracic compression fracture without progressive height loss. IMPRESSION: Cardiomegaly with likely diffuse interstitial edema and a small right pleural effusion. Findings are suggestive of congestive heart failure. Electronically Signed   By: Feliberto Harts MD   On: 11/13/2019 15:46     Assessment and Plan:   #Heart Failure with Borderline EF (40-45%) #Grade II Diastolic Dysfunction NYHA Class III. Acutely Decompensated. Patient with recent admission for hypervolemic hyponatremia with significant volume overload.TTE with LVEF 40-45% with RV dysfunction and LAE/RAE. ECG with lateral q-waves and TWI in inferior leads concerning for ischemia. No ischemic work-up done on the at admission. No BNP in the  system.  Notably, with low albumin and significant weight loss over the past several months. Concern that patient may have udnerlying malignancy or systemic condition as degree of edema is out of proportion to EF. No protein in urine to suggest nephrotic syndrome. TSH high at 9 and patient has since been started on euthyrox. On exam today, patient is very grossly overloaded with JVP to earlobe when sitting upright. 2+ pitting edema to the thighs. Very SOB with minimal exertion. No admitted for further management -Start lasix 60mg  IV BID with low threshold for lasix  gtt given significant volume overload -No BB for now as acutely decompensated and not on BB at home; will need once more euvolemic -Continue entresto (started on past admission) -Monitor on telemetry -Trend I/Os and daily weights -Low Na diet; fluid restrict to -Cardiac MRI once able to lie flat -Once more euvolemic, plan for [facility] for further evaluation as patient has evidence of prior MI on ecg and no ischemic work-up in system -? 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 No ischemic work-up in our system however patient with decompensated HF currently. Very long PR interval also notable concerning for conduction disease. -Start ASA 81mg  daily -Start crestor 10mg  daily -Needs LHC/RHC once more euvolemic for ischemic work-up -Very long 1st degree block concerning for both HF and conduction disease (?infiltrative disease like sarcoid)-->cardiac MRI once able to lie flat  #Unintentional weight loss: Patient with reported 27lbs unintentional weight loss. Highly concerning for underlying malignancy. Peripheral smear unrevealing. Rheum work-up on last admission  unremarkable. No infectious etiology determined. No CT scans in the system. -Consider CT chest/abdomen/pelvis -Needs colonoscopy for routine cancer screening  #Anasarca: Out-of-proportion to heart failure with EF 40-45%. No nephrotic syndrome as no protein in urine. TSH notably elevated with concern that hypothyroidism may be contributing. Also with concern for malignancy as above. -Manage HF as above -Needs ischemia work-up -? Malignancy work-up with CT imaging -Repeat TSH and free T4  #Hypothyroidism: TSH 9.412 on hospital admission in August. Started on euthyrox. -Repeat TSH, free T4 -Continue euthyrox   New York Heart Association (NYHA) Functional Class NYHA Class III   Severity of Illness: The appropriate patient status for this patient is INPATIENT. Inpatient status is judged to be reasonable and necessary in order to provide the required intensity of service to ensure the patient's safety. The patient's presenting symptoms, physical exam findings, and initial radiographic and laboratory data in the context of their chronic comorbidities is felt to place them at high risk for further clinical deterioration. Furthermore, it is not anticipated that the patient will be medically stable for discharge from the hospital within 2 midnights of admission. The following factors support the patient status of inpatient.   " The patient's presenting symptoms include shortness of breath, orthopnea, PND. " The worrisome physical exam findings include elevated JVD, 2+ pitting edema. " The initial radiographic and laboratory data are worrisome because of TTE with LVEF 40-45%. " The chronic co-morbidities include hypertension.   * I certify that at the point of admission it is my clinical judgment that the patient will require inpatient hospital care spanning beyond 2 midnights from the point of admission due to high intensity of service, high risk for further deterioration and high frequency of  surveillance required.*    For questions or updates, please contact [facility] Please consult [email or web address] for contact info under     Signed, , MD  11/13/2019 4:17 PM

## 2019-11-13 NOTE — ED Triage Notes (Signed)
Pt sent to ED by MD for fluid retention.  Pt c/o swelling in groin and bil legs also c/o shortness of breath.  Pt was admitted recently for same

## 2019-11-13 NOTE — Progress Notes (Signed)
Pt arrived on the unit. VS taken, Placed on tele monitor. Call bell within reach. MD paged. Will continue to monitor

## 2019-11-13 NOTE — Patient Instructions (Addendum)
Medication Instructions:   Your physician recommends that you continue on your current medications as directed. Please refer to the Current Medication list given to you today.  *If you need a refill on your cardiac medications before your next appointment, please call your pharmacy*   Follow-Up:  Will be arranged post-discharge from the hospital.    Other Instructions  Please go straight to [facility] Room now, to get evaluation and work-up.   Do the following things EVERY DAY:  1) Weigh yourself EVERY morning after you go to the bathroom but before you eat or drink anything. Write this number down in a weight log/diary. If you gain 3 pounds overnight or 5 pounds in a week, call the office.  2) Take your medicines as prescribed. If you have concerns about your medications, please call us before you stop taking them.   3) Eat low salt foods--Limit salt (sodium) to 2000 mg per day. This will help prevent your body from holding onto fluid. Read food labels as many processed foods have a lot of sodium, especially canned goods and prepackaged meats. If you would like some assistance choosing low sodium foods, we would be happy to set you up with a nutritionist.  4) Stay as active as you can everyday. Staying active will give you more energy and make your muscles stronger. Start with 5 minutes at a time and work your way up to 30 minutes a day. Break up your activities--do some in the morning and some in the afternoon. Start with 3 days per week and work your way up to 5 days as you can.  If you have chest pain, feel short of breath, dizzy, or lightheaded, STOP. If you don't feel better after a short rest, call 911. If you do feel better, call the office to let us know you have symptoms with exercise.  5) Limit all fluids for the day to less than 2 liters. Fluid includes all drinks, coffee, juice, ice chips, soup, jello, and all other liquids.

## 2019-11-13 NOTE — H&P (Signed)
Interval H&P  Patient seen and examined Brief note  15 /M with HFrEF (40-45%)-moderately dilated, RV systolic function is reduced, hypothyroidism, HTN, originally from [place] presented to the cardiology clinic for SOB and is directly admitted for CHF exacerbation - typical CHF symptoms of orthopnea, pnd and worsening edema in belly, scortum, LE edema - no chest pain, but has unintentional weigh loss (recent extensive work up has been negative)  On exam- laying flat, JVD angle of mandible, S1, s2 heard, LUSB ejection systolic murmur, no radiation, lungs mild crackles, scrotal edema, 4+ LE edema Hemodynamically stable, wet and warm EKG- 1st degree AV block, q waves in the I, avL CXR- diffuse interstitial edema, cardiomegaly  - etiology of HF (yet to be determined). Unclear about the weight loss Plan: - continue IV lasix 60mg  BID (as ordered), continue home meds - will order CT chest/ab/pelvis for tomorrow to r/o malignancy - plan for LHC/RHC on Monday. - please see H&P for further details  Saturday, MD Cardiology

## 2019-11-14 ENCOUNTER — Inpatient Hospital Stay (HOSPITAL_COMMUNITY): Payer: Medicaid Other

## 2019-11-14 DIAGNOSIS — I5023 Acute on chronic systolic (congestive) heart failure: Secondary | ICD-10-CM | POA: Diagnosis not present

## 2019-11-14 LAB — CBC
HCT: 34.5 % — ABNORMAL LOW (ref 39.0–52.0)
Hemoglobin: 11.5 g/dL — ABNORMAL LOW (ref 13.0–17.0)
MCH: 25.1 pg — ABNORMAL LOW (ref 26.0–34.0)
MCHC: 33.3 g/dL (ref 30.0–36.0)
MCV: 75.2 fL — ABNORMAL LOW (ref 80.0–100.0)
Platelets: 301 10*3/uL (ref 150–400)
RBC: 4.59 MIL/uL (ref 4.22–5.81)
RDW: 17.9 % — ABNORMAL HIGH (ref 11.5–15.5)
WBC: 7.1 10*3/uL (ref 4.0–10.5)
nRBC: 0 % (ref 0.0–0.2)

## 2019-11-14 LAB — BASIC METABOLIC PANEL
Anion gap: 9 (ref 5–15)
BUN: <5 mg/dL — ABNORMAL LOW (ref 8–23)
CO2: 30 mmol/L (ref 22–32)
Calcium: 8.1 mg/dL — ABNORMAL LOW (ref 8.9–10.3)
Chloride: 100 mmol/L (ref 98–111)
Creatinine, Ser: 0.81 mg/dL (ref 0.61–1.24)
GFR, Estimated: >60 mL/min (ref >60)
Glucose, Bld: 89 mg/dL (ref 70–99)
Potassium: 3.7 mmol/L (ref 3.5–5.1)
Sodium: 139 mmol/L (ref 135–145)

## 2019-11-14 LAB — MAGNESIUM: Magnesium: 1.8 mg/dL (ref 1.7–2.4)

## 2019-11-14 IMAGING — CT CT CHEST-ABD-PELV W/ CM
2 of 5 series · 14 of 46 positions shown, 16 images · IV contrast (omnipaque)
Comparison: Chest radiograph [DATE]

CLINICAL DATA: Unintentional weight loss

EXAM:
CT CHEST, ABDOMEN, AND PELVIS WITH CONTRAST
TECHNIQUE: Multidetector CT imaging of the chest, abdomen and pelvis was
performed following the standard protocol during bolus
administration of intravenous contrast.
CONTRAST:  100mL OMNIPAQUE IOHEXOL 300 MG/ML  SOLN

[Series 3: cap with · axial · 0.70mm/px · z∈[-549,-54]mm · 11 of 119 slices shown, 13 images]
[im 10/119  soft-tissue]
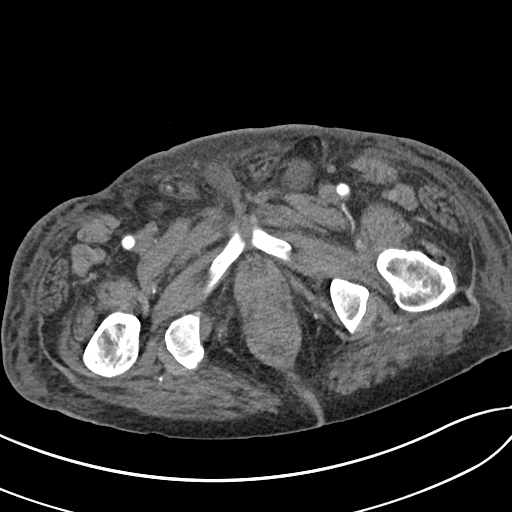
[im 10/119  bone]
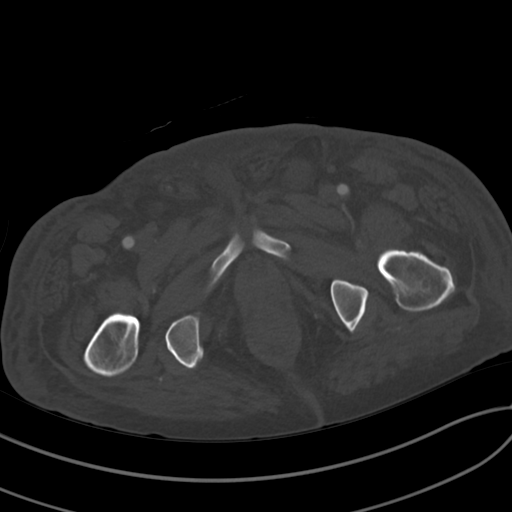
[im 19/119  soft-tissue]
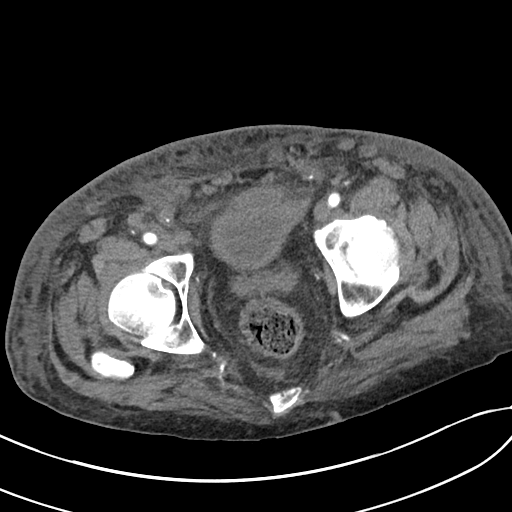
[im 28/119  soft-tissue]
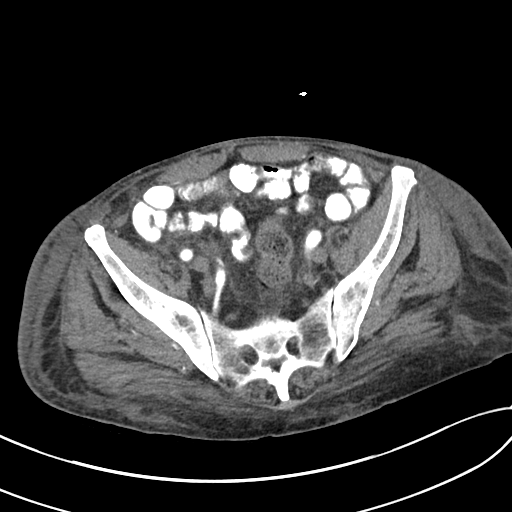
[im 37/119  soft-tissue]
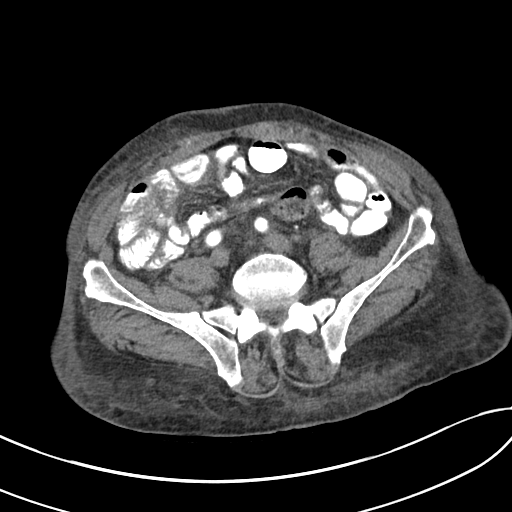
[im 46/119  soft-tissue]
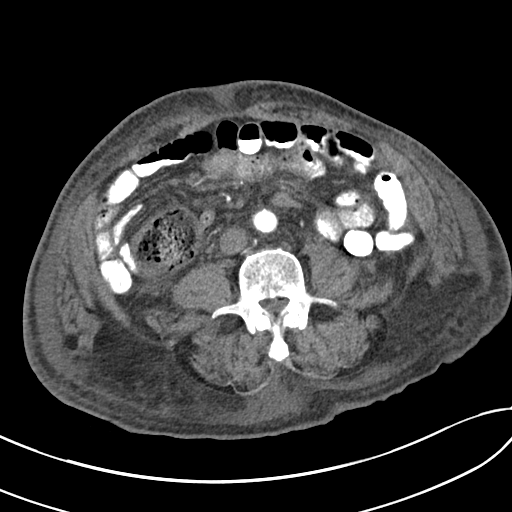
[im 64/119  soft-tissue]
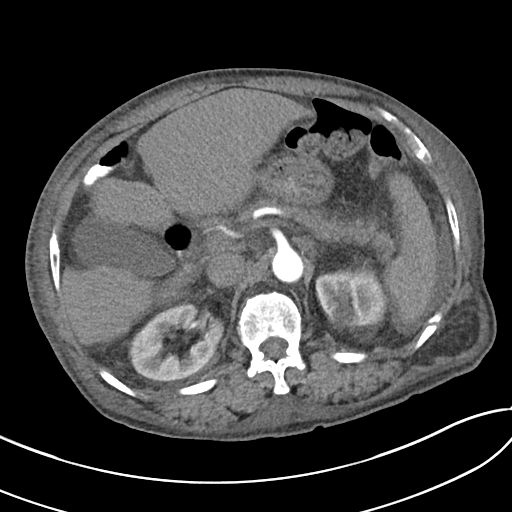
[im 73/119  soft-tissue]
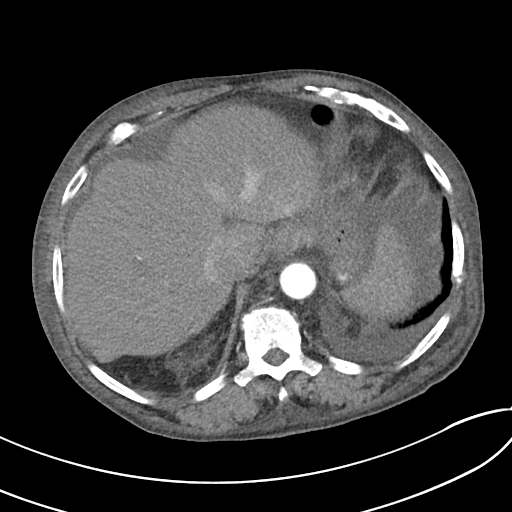
[im 82/119  soft-tissue]
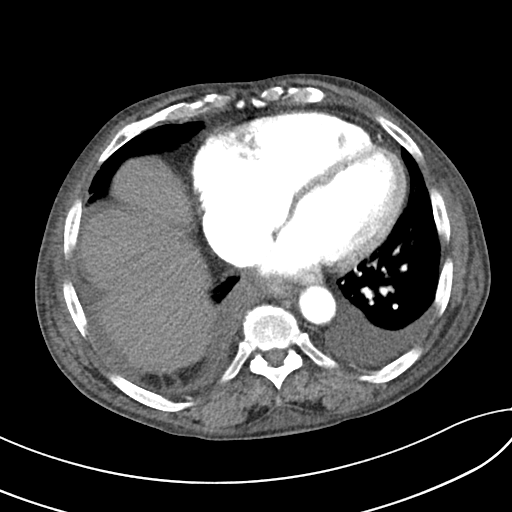
[im 91/119  soft-tissue]
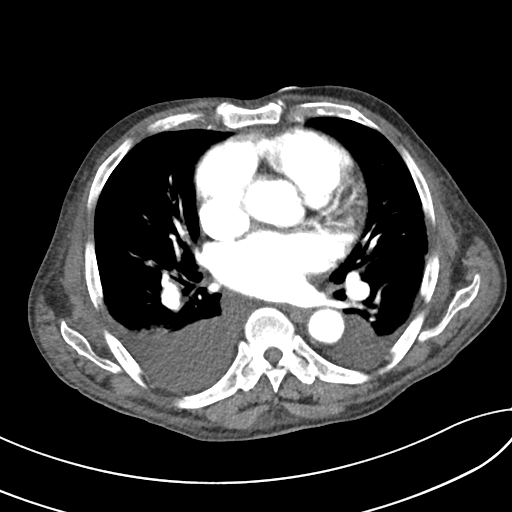
[im 91/119  bone]
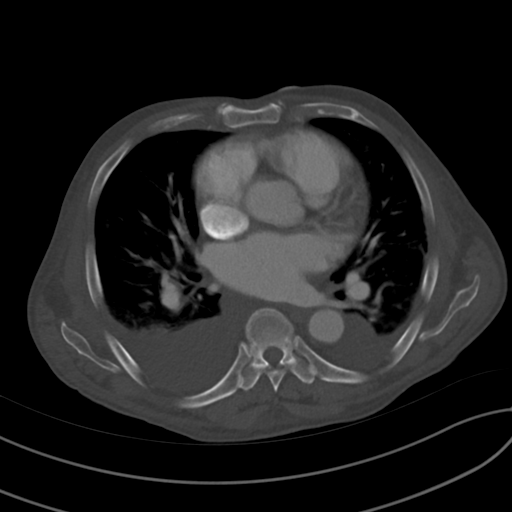
[im 100/119  soft-tissue]
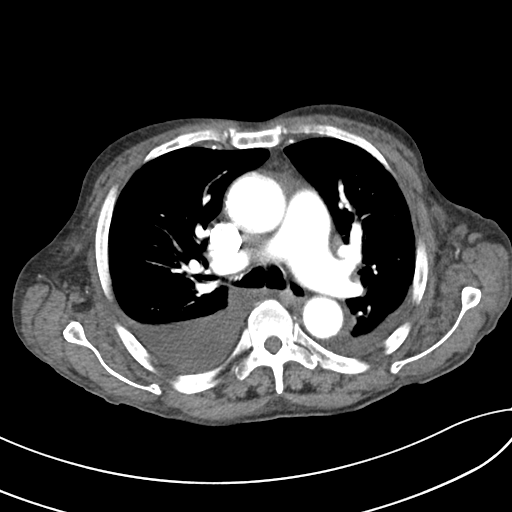
[im 109/119  soft-tissue]
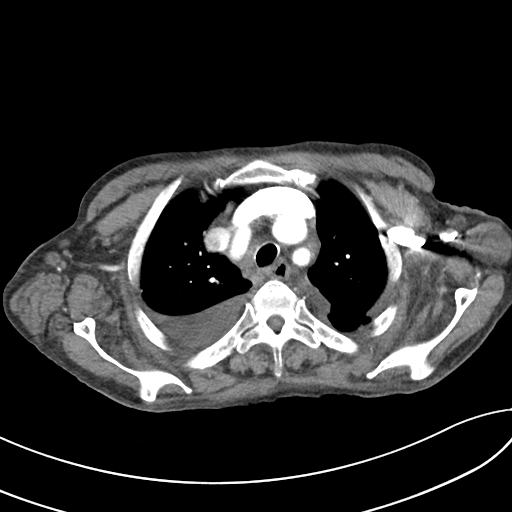

[Series 6: cor · coronal · 0.78mm/px · 3 of 88 slices shown]
[im 30/88  soft-tissue]
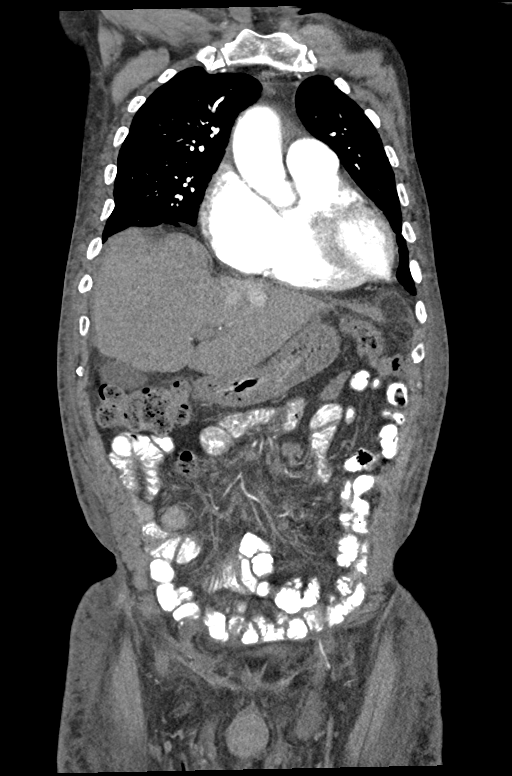
[im 39/88  soft-tissue]
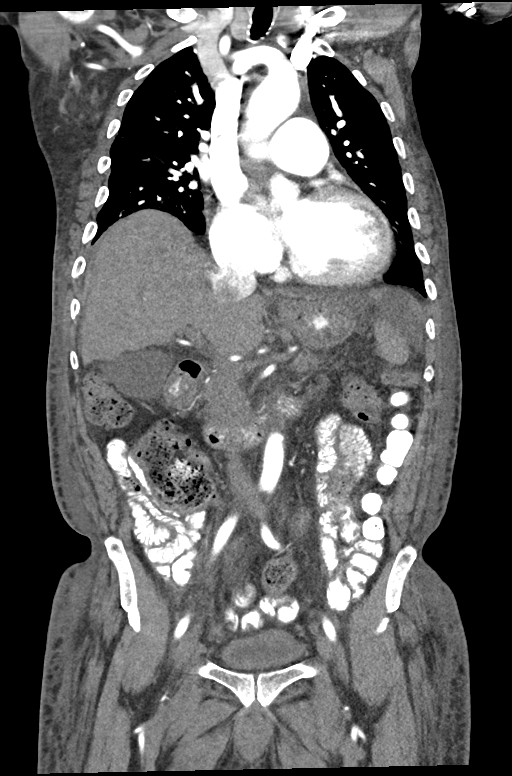
[im 49/88  soft-tissue]
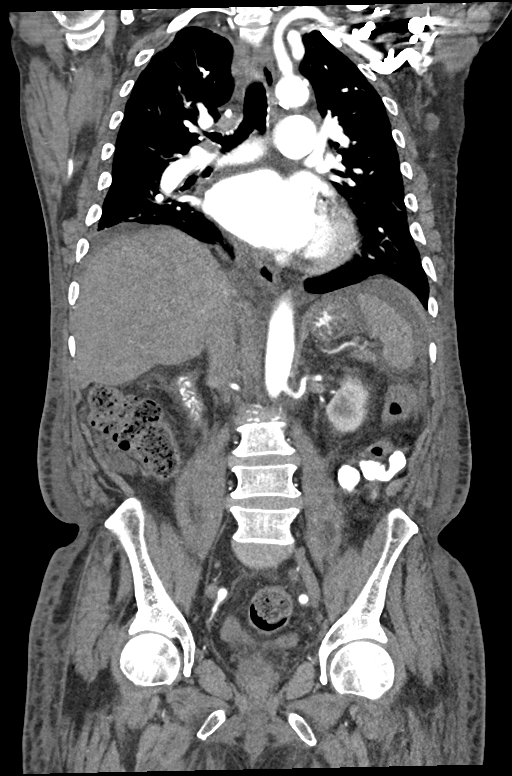

[14 of 46 positions shown; findings below may reference images not displayed]

FINDINGS: CT CHEST FINDINGS

Cardiovascular: Atherosclerotic calcification of the aortic arch.
Mild to moderate cardiomegaly.

Mediastinum/Nodes: Prevascular lymph node 0.9 cm in short axis on
image [DATE]. Right paratracheal lymph node 0.8 cm in short axis on
image 18 of series 3. These lymph nodes are indistinct and may be
upper normal in size due to passive congestion. Similar scattered
small indistinct axillary lymph nodes.

Lungs/Pleura: Small left and moderate right pleural effusion with
passive atelectasis. Minimal scattered subsegmental atelectasis in
the lungs.

Musculoskeletal: Diffuse subcutaneous edema.

Nonunited fractures of the ninth, tenth, and eleventh ribs
posterolaterally with a partially healed fracture of the left
twelfth rib posterolaterally.

CT ABDOMEN PELVIS FINDINGS

Hepatobiliary: Prominence of the lateral segment left hepatic lobe
could be a sign of early cirrhosis. No focal liver lesion is
identified. Gallbladder unremarkable.

Pancreas: Unremarkable

Spleen: Unremarkable

Adrenals/Urinary Tract: 2.5 by 2.4 cm fluid density lesion exophytic
from the left kidney upper pole favoring simple cyst. The adrenal
glands appear unremarkable.

Stomach/Bowel: Unremarkable

Vascular/Lymphatic: No pathologic adenopathy.

Reproductive: Unremarkable

Other: Diffuse subcutaneous and mesenteric edema. Small amount of
ascites along the paracolic gutters. Mild perihepatic and
perisplenic ascites.

Musculoskeletal: Small left hydrocele along the spermatic cord. Disc
bulges at L3-4, L4-5, and L5-S1.
IMPRESSION: 1. Diffuse subcutaneous and mesenteric edema with small amount of
ascites along the paracolic gutters and mild perihepatic and
perisplenic ascites. The appearance is compatible with third spacing
of fluid and may be related to the patient's hypoalbuminemia and
hypoproteinemia.
2. Mild to moderate cardiomegaly.
3. Small left and moderate right pleural effusions with passive
atelectasis.
4. Prominence of the lateral segment left hepatic lobe could be a
sign of early cirrhosis.
5. Nonunited fractures of the left ninth, tenth, and eleventh ribs
posterolaterally with a partially healed fracture of the left
twelfth rib posterolaterally.
6. Disc bulges at L3-4, L4-5, and L5-S1.
7. Small left hydrocele along the spermatic cord.
8. Aortic atherosclerosis.

Aortic Atherosclerosis ([5N]-[5N]).

## 2019-11-14 MED ORDER — IOHEXOL 9 MG/ML PO SOLN
ORAL | Status: AC
  Administered 2019-11-14: 500 mL
  Filled 2019-11-14: qty 1000

## 2019-11-14 MED ORDER — IOHEXOL 300 MG/ML  SOLN
100.0000 mL | Freq: Once | INTRAMUSCULAR | Status: AC | PRN
  Administered 2019-11-14: 100 mL via INTRAVENOUS

## 2019-11-14 MED ORDER — MAGNESIUM SULFATE 2 GM/50ML IV SOLN
2.0000 g | Freq: Once | INTRAVENOUS | Status: AC
  Administered 2019-11-15: 2 g via INTRAVENOUS
  Filled 2019-11-14: qty 50

## 2019-11-14 MED ORDER — POTASSIUM CHLORIDE ER 10 MEQ PO TBCR
40.0000 meq | EXTENDED_RELEASE_TABLET | Freq: Once | ORAL | Status: AC
  Administered 2019-11-15: 40 meq via ORAL
  Filled 2019-11-14: qty 4

## 2019-11-14 NOTE — Progress Notes (Signed)
Pt SpO2 88% on room air at rest, pt states he feels a little short of breath, placed on 2L sats 93%. Will continue to monitor

## 2019-11-14 NOTE — Consult Note (Signed)
Advanced Heart Failure Team Consult Note   Primary Physician: Trey Sailors, PA PCP-Cardiologist:  No primary care provider on file.   Referring Dr. Johney Frame   Reason for Consultation: Decompensated HF  HPI:    Steven Frazier a 64 y.o.[place]rom [place] (emigrated 0[address]) with systolic HF (EF 10-25%), HTN, and hypothroidism whom we are asked to see for further evaluation of his HF by Dr. Johney Frame.   The patient was recently admission to [facility] on 09/09/2019 with hypervolemic hyponatremia in the setting of systolic HF with Na 852.Also noted to have unintentional significant weight loss (~20-25 pounds), low albumin, microcytic anemia and eosinophilia. Work-up including peripheral smear, HIV, hepatitis and parasitic screens negative. During the admission, the patient was diuresed with lasix 60m IV BID with significant improvement in LE edema and Na. He was discharged home on lasix 270mdaily and lisinopril 85m37mith plans for follow-up in Cardiology clinic for further management.  He was seen in the office on 10/15 by Dr. PemJohney Framed noted to have progressive HF symptoms with recurrent, marked overload. He was readmitted and started on IV lasix.   Good diuresis overnight with over 3L off. Breathing some better. Able to lie flat. Very weak. Denies recent fevers, chills, arthralgias, rash, diarrhea, flushing or sinusitis.  Hstrop 4. 14% eosinophils (AEC = 924)  Family history: No known cardiac disease  TTE 08/2019: LVEF 40-45%, mildly depressed RV systolic function and RV is mildly enlarged, severe LAE, moderately enlarged RA, trivial MR  ECG NSR with very long 1 degree AVB (PR interval 360); poor r-wave progression, q waves in I, aVL and STD in inferior leads. No ischemic work-up in our system.   Review of Systems: [y] = yes, [ ]  = no   . General: Weight gain [ ] ; Weight loss [y Blue.Reese Anorexia [ y]; Fatigue [y Blue.Reese Fever [ ] ; Chills [ ] ; Weakness [ y]  . Cardiac: Chest  pain/pressure [ ] ; Resting SOB [ y]; Exertional SOB [ y]; Orthopnea [ ] ; Pedal Edema [ y]; Palpitations [ ] ; Syncope [ ] ; Presyncope [ ] ; Paroxysmal nocturnal dyspnea[ ]   . Pulmonary: Cough [ ] ; Wheezing[ ] ; Hemoptysis[ ] ; Sputum [ ] ; Snoring [ ]   . GI: Vomiting[ ] ; Dysphagia[ ] ; Melena[ ] ; Hematochezia [ ] ; Heartburn[ ] ; Abdominal pain [ ] ; Constipation [ ] ; Diarrhea [ ] ; BRBPR [ ]   . GU: Hematuria[ ] ; Dysuria [ ] ; Nocturia[ ]   . Vascular: Pain in legs with walking [ ] ; Pain in feet with lying flat [ ] ; Non-healing sores [ ] ; Stroke [ ] ; TIA [ ] ; Slurred speech [ ] ;  . Neuro: Headaches[ ] ; Vertigo[ ] ; Seizures[ ] ; Paresthesias[ ] ;Blurred vision [ ] ; Diplopia [ ] ; Vision changes [ ]   . Ortho/Skin: Arthritis [ ] ; Joint pain [ ] ; Muscle pain [ ] ; Joint swelling [ ] ; Back Pain [ ] ; Rash [ ]   . Psych: Depression[ ] ; Anxiety[ ]   . Heme: Bleeding problems [ ] ; Clotting disorders [ ] ; Anemia [ ]   . Endocrine: Diabetes [ ] ; Thyroid dysfunction[ ]   Home Medications Prior to Admission medications   Medication Sig Start Date End Date Taking? Authorizing Provider  ENTRESTO 49-51 MG Take 1 tablet by mouth 2 (two) times daily. 10/30/19  Yes [provider]  EUTHYROX 25 MCG tablet Take 25 mcg by mouth daily. 11/03/19  Yes [provider]  folic acid (FOLVITE) 1 MG tablet Take 1 tablet (1 mg total) by mouth daily. 09/10/19  Yes LeeGilberto Better  K, MD  furosemide (LASIX) 20 MG tablet Take 1 tablet (20 mg total) by mouth daily. Patient taking differently: Take 40 mg by mouth daily.  09/09/19  Yes Mosetta Anis, MD  MULTIPLE VITAMIN PO Take 1 tablet by mouth daily.   Yes [provider]    Past Medical History: Past Medical History:  Diagnosis Date  . Heart failure ([place])   . Hypertension   . Hyponatremia   . Hypothyroidism   . Shoulder pain     Past Surgical History: Past Surgical History:  Procedure Laterality Date  . NO PAST SURGERIES      Family History: No family history on  file.  Social History: Social History   Socioeconomic History  . Marital status: Married    Spouse name: Not on file  . Number of children: Not on file  . Years of education: Not on file  . Highest education level: Not on file  Occupational History  . Not on file  Tobacco Use  . Smoking status: Never Smoker  . Smokeless tobacco: Never Used  Substance and Sexual Activity  . Alcohol use: Not Currently  . Drug use: Never  . Sexual activity: Not on file  Other Topics Concern  . Not on file  Social History Narrative  . Not on file   Social Determinants of Health   Financial Resource Strain:   . Difficulty of Paying Living Expenses: Not on file  Food Insecurity:   . Worried About Charity fundraiser in the Last Year: Not on file  . Ran Out of Food in the Last Year: Not on file  Transportation Needs:   . Lack of Transportation (Medical): Not on file  . Lack of Transportation (Non-Medical): Not on file  Physical Activity:   . Days of Exercise per Week: Not on file  . Minutes of Exercise per Session: Not on file  Stress:   . Feeling of Stress : Not on file  Social Connections:   . Frequency of Communication with Friends and Family: Not on file  . Frequency of Social Gatherings with Friends and Family: Not on file  . Attends Religious Services: Not on file  . Active Member of Clubs or Organizations: Not on file  . Attends Archivist Meetings: Not on file  . Marital Status: Not on file    Allergies:  No Known Allergies  Objective:    Vital Signs:   Temp:  [97.6 F (36.4 C)-98.3 F (36.8 C)] 97.8 F (36.6 C) (10/16 0906) Pulse Rate:  [70-95] 81 (10/16 0906) Resp:  [16-20] 17 (10/16 0906) BP: (112-152)/(64-102) 129/83 (10/16 0906) SpO2:  [91 %-97 %] 94 % (10/16 0906) Weight:  [67.4 kg-70.7 kg] 67.4 kg (10/16 0519) Last BM Date: 11/13/19  Weight change: Filed Weights   11/13/19 1511 11/13/19 2016 11/14/19 0519  Weight: 70.3 kg 67.6 kg 67.4 kg     Intake/Output:   Intake/Output Summary (Last 24 hours) at 11/14/2019 0920 Last data filed at 11/14/2019 0940 Gross per 24 hour  Intake 420 ml  Output 3550 ml  Net -3130 ml      Physical Exam    General: Elderly male. Weak appearing. Lying flat in bed. No resp difficulty HEENT: normal Neck: supple. JVP to ear . Carotids 2+ bilat; no bruits. No lymphadenopathy or thyromegaly appreciated. Cor: PMI nondisplaced. Regular rate & rhythm. 2/6 TR Lungs: clear Abdomen: soft, nontender, nondistended. No hepatosplenomegaly. No bruits or masses. Good bowel sounds. Extremities: no cyanosis,  clubbing, rash, 2+ edema Neuro: alert & orientedx3, cranial nerves grossly intact. moves all 4 extremities w/o difficulty. Affect pleasant   Telemetry   NSR 80s Personally reviewed  EKG    NSR 85 marked 1AVB (340 ms) Qs in I & AVL (not due to lead placement). Occasional PVCs Personally reviewed   Labs   Basic Metabolic Panel: Recent Labs  Lab 11/13/19 1527 11/13/19 2045 11/14/19 0307  NA 136  --  139  K 3.6  --  3.7  CL 99  --  100  CO2 29  --  30  GLUCOSE 111*  --  89  BUN <5*  --  <5*  CREATININE 0.81 0.84 0.81  CALCIUM 8.0*  --  8.1*  MG  --   --  1.8    Liver Function Tests: Recent Labs  Lab 11/13/19 1527  AST 31  ALT 18  ALKPHOS 115  BILITOT 0.6  PROT 6.0*  ALBUMIN 2.5*   No results for input(s): LIPASE, AMYLASE in the last 168 hours. No results for input(s): AMMONIA in the last 168 hours.  CBC: Recent Labs  Lab 11/13/19 1527 11/13/19 2045 11/14/19 0307  WBC 6.6 6.9 7.1  NEUTROABS 2.5  --   --   HGB 11.5* 12.5* 11.5*  HCT 35.7* 38.2* 34.5*  MCV 76.9* 75.9* 75.2*  PLT 359 379 301    Cardiac Enzymes: No results for input(s): CKTOTAL, CKMB, CKMBINDEX, TROPONINI in the last 168 hours.  BNP: BNP (last 3 results) Recent Labs    11/13/19 2045  BNP >4,500.0*    ProBNP (last 3 results) No results for input(s): PROBNP in the last 8760  hours.   CBG: No results for input(s): GLUCAP in the last 168 hours.  Coagulation Studies: No results for input(s): LABPROT, INR in the last 72 hours.   Imaging   DG Chest 2 View  Result Date: 11/13/2019 CLINICAL DATA:  Shortness of breath. Cough, hypertension. Swelling in groin and bilateral legs. EXAM: CHEST - 2 VIEW COMPARISON:  09/04/2019 FINDINGS: Mild diffuse interstitial prominence. Small right pleural effusion. No discernible pneumothorax. Enlarged cardiac silhouette. Aortic atherosclerosis. Pulmonary vascular congestion. No confluent consolidation. Polyarticular degenerative change. Remote lower thoracic compression fracture without progressive height loss. IMPRESSION: Cardiomegaly with likely diffuse interstitial edema and a small right pleural effusion. Findings are suggestive of congestive heart failure. Electronically Signed   By: Margaretha Sheffield MD   On: 11/13/2019 15:46      Medications:     Current Medications: . aspirin EC  81 mg Oral Daily  . enoxaparin (LOVENOX) injection  40 mg Subcutaneous Q24H  . folic acid  1 mg Oral Daily  . furosemide  60 mg Intravenous Q12H  . iohexol      . levothyroxine  25 mcg Oral Daily  . rosuvastatin  10 mg Oral Daily  . sacubitril-valsartan  1 tablet Oral BID  . sodium chloride flush  3 mL Intravenous Q12H     Infusions: . sodium chloride       Assessment/Plan   1. Acute on chronic systolic HF with marked volume overload - etiology unclear.  - ECG markedly abnormal suggesting ischemic etiology vs infiltrative process. Markedly elevated BNP, marked 1AVB and high lateral qs suggest possible infiltrative process) - auto-immune serologies negative. ESR 5, SPEP/serum light chains negative - plan cMRI and R/L cath on Monday - Continue Entresto 24/26 - Continue IV lasix - add spiro 12.5 - hold b-blocker for now with possible low-output and marked 1AVB  2. Weight loss, unintentional - extensive work-up during recent  admission was negative - planning CT C/A/P to further evaluate  3. Eosinophilia, mild - AEC 924 currently - parasite work-up last admit was negative - doubt HES  4. Protein-calorie malnutrition, severe - Boost supplements   Length of Stay: 1  Glori Bickers, MD  11/14/2019, 9:20 AM  Advanced Heart Failure Team Pager [phone] (M-F; 7a - 4p)  Please contact [facility] for night-coverage after hours (4p -7a ) and weekends on [email or web address]

## 2019-11-15 DIAGNOSIS — I5023 Acute on chronic systolic (congestive) heart failure: Secondary | ICD-10-CM | POA: Diagnosis not present

## 2019-11-15 LAB — BASIC METABOLIC PANEL
Anion gap: 10 (ref 5–15)
BUN: <5 mg/dL — ABNORMAL LOW (ref 8–23)
CO2: 31 mmol/L (ref 22–32)
Calcium: 8 mg/dL — ABNORMAL LOW (ref 8.9–10.3)
Chloride: 96 mmol/L — ABNORMAL LOW (ref 98–111)
Creatinine, Ser: 0.82 mg/dL (ref 0.61–1.24)
GFR, Estimated: >60 mL/min (ref >60)
Glucose, Bld: 76 mg/dL (ref 70–99)
Potassium: 3.5 mmol/L (ref 3.5–5.1)
Sodium: 137 mmol/L (ref 135–145)

## 2019-11-15 LAB — CBC
HCT: 34.4 % — ABNORMAL LOW (ref 39.0–52.0)
Hemoglobin: 11.5 g/dL — ABNORMAL LOW (ref 13.0–17.0)
MCH: 25.2 pg — ABNORMAL LOW (ref 26.0–34.0)
MCHC: 33.4 g/dL (ref 30.0–36.0)
MCV: 75.4 fL — ABNORMAL LOW (ref 80.0–100.0)
Platelets: 287 10*3/uL (ref 150–400)
RBC: 4.56 MIL/uL (ref 4.22–5.81)
RDW: 17.8 % — ABNORMAL HIGH (ref 11.5–15.5)
WBC: 6.7 10*3/uL (ref 4.0–10.5)
nRBC: 0 % (ref 0.0–0.2)

## 2019-11-15 LAB — MAGNESIUM: Magnesium: 2.1 mg/dL (ref 1.7–2.4)

## 2019-11-15 MED ORDER — POTASSIUM CHLORIDE CRYS ER 20 MEQ PO TBCR
20.0000 meq | EXTENDED_RELEASE_TABLET | Freq: Every day | ORAL | Status: DC
  Administered 2019-11-16 – 2019-11-20 (×5): 20 meq via ORAL
  Filled 2019-11-15 (×5): qty 1

## 2019-11-15 MED ORDER — SODIUM CHLORIDE 0.9% FLUSH
3.0000 mL | INTRAVENOUS | Status: DC | PRN
  Administered 2019-11-15: 3 mL via INTRAVENOUS

## 2019-11-15 MED ORDER — POTASSIUM CHLORIDE CRYS ER 20 MEQ PO TBCR
40.0000 meq | EXTENDED_RELEASE_TABLET | Freq: Once | ORAL | Status: AC
  Administered 2019-11-15: 40 meq via ORAL
  Filled 2019-11-15: qty 2

## 2019-11-15 MED ORDER — SODIUM CHLORIDE 0.9 % IV SOLN: INTRAVENOUS | Status: DC

## 2019-11-15 MED ORDER — SODIUM CHLORIDE 0.9% FLUSH
3.0000 mL | Freq: Two times a day (BID) | INTRAVENOUS | Status: DC
  Administered 2019-11-16: 3 mL via INTRAVENOUS

## 2019-11-15 MED ORDER — LEVOTHYROXINE SODIUM 50 MCG PO TABS
50.0000 ug | ORAL_TABLET | Freq: Every day | ORAL | Status: DC
  Administered 2019-11-16: 50 ug via ORAL
  Filled 2019-11-15: qty 1

## 2019-11-15 MED ORDER — BOOST / RESOURCE BREEZE PO LIQD CUSTOM
237.0000 mL | Freq: Three times a day (TID) | ORAL | Status: DC
  Administered 2019-11-16: 1 via ORAL

## 2019-11-15 MED ORDER — SODIUM CHLORIDE 0.9 % IV SOLN: 250.0000 mL | INTRAVENOUS | Status: DC | PRN

## 2019-11-15 MED ORDER — ASPIRIN 81 MG PO CHEW
81.0000 mg | CHEWABLE_TABLET | ORAL | Status: AC
  Administered 2019-11-16: 81 mg via ORAL
  Filled 2019-11-15: qty 1

## 2019-11-15 NOTE — Progress Notes (Signed)
Advanced Heart Failure Rounding Note   Subjective:    Continues to diurese well. 6.5 L of urine out yesterday. Weight down 15 pounds in 2 days. Creatinine stable.  Denies SOB, orthopnea or PND.   CT C/A/P yesterday with evidence of anasarca. No malignancy or lymphadenopathy.     Objective:   Weight Range:  Vital Signs:   Temp:  [97.2 F (36.2 C)-97.8 F (36.6 C)] 97.6 F (36.4 C) (10/17 0804) Pulse Rate:  [77-84] 78 (10/17 0804) Resp:  [16-21] 16 (10/17 0804) BP: (117-144)/(70-96) 120/70 (10/17 0804) SpO2:  [89 %-98 %] 96 % (10/17 0804) Weight:  [63.6 kg] 63.6 kg (10/17 0412) Last BM Date: 11/13/19  Weight change: Filed Weights   11/13/19 2016 11/14/19 0519 11/15/19 0412  Weight: 67.6 kg 67.4 kg 63.6 kg    Intake/Output:   Intake/Output Summary (Last 24 hours) at 11/15/2019 0903 Last data filed at 11/15/2019 0805 Gross per 24 hour  Intake 630 ml  Output 5775 ml  Net -5145 ml     Physical Exam: General:  Thin weak appearing. No resp difficulty HEENT: normal Neck: supple. JVP 8-9 . Carotids 2+ bilat; no bruits. No lymphadenopathy or thryomegaly appreciated. Cor: PMI nondisplaced. Regular rate & rhythm. No rubs, gallops or murmurs. Lungs: clear Abdomen: soft, nontender, nondistended. No hepatosplenomegaly. No bruits or masses. Good bowel sounds. Extremities: cachetic no cyanosis, clubbing, rash, 1-2+ edema Neuro: alert & orientedx3, cranial nerves grossly intact. moves all 4 extremities w/o difficulty. Affect pleasant  Telemetry: Sinus 70s Personally reviewed   Labs: Basic Metabolic Panel: Recent Labs  Lab 11/13/19 1527 11/13/19 2045 11/14/19 0307 11/15/19 0306  NA 136  --  139 137  K 3.6  --  3.7 3.5  CL 99  --  100 96*  CO2 29  --  30 31  GLUCOSE 111*  --  89 76  BUN <5*  --  <5* <5*  CREATININE 0.81 0.84 0.81 0.82  CALCIUM 8.0*  --  8.1* 8.0*  MG  --   --  1.8 2.1    Liver Function Tests: Recent Labs  Lab 11/13/19 1527  AST 31  ALT  18  ALKPHOS 115  BILITOT 0.6  PROT 6.0*  ALBUMIN 2.5*   No results for input(s): LIPASE, AMYLASE in the last 168 hours. No results for input(s): AMMONIA in the last 168 hours.  CBC: Recent Labs  Lab 11/13/19 1527 11/13/19 2045 11/14/19 0307 11/15/19 0306  WBC 6.6 6.9 7.1 6.7  NEUTROABS 2.5  --   --   --   HGB 11.5* 12.5* 11.5* 11.5*  HCT 35.7* 38.2* 34.5* 34.4*  MCV 76.9* 75.9* 75.2* 75.4*  PLT 359 379 301 287    Cardiac Enzymes: No results for input(s): CKTOTAL, CKMB, CKMBINDEX, TROPONINI in the last 168 hours.  BNP: BNP (last 3 results) Recent Labs    11/13/19 2045  BNP >4,500.0*    ProBNP (last 3 results) No results for input(s): PROBNP in the last 8760 hours.    Other results:  Imaging: DG Chest 2 View  Result Date: 11/13/2019 CLINICAL DATA:  Shortness of breath. Cough, hypertension. Swelling in groin and bilateral legs. EXAM: CHEST - 2 VIEW COMPARISON:  09/04/2019 FINDINGS: Mild diffuse interstitial prominence. Small right pleural effusion. No discernible pneumothorax. Enlarged cardiac silhouette. Aortic atherosclerosis. Pulmonary vascular congestion. No confluent consolidation. Polyarticular degenerative change. Remote lower thoracic compression fracture without progressive height loss. IMPRESSION: Cardiomegaly with likely diffuse interstitial edema and a small right pleural effusion.  Findings are suggestive of congestive heart failure. Electronically Signed   By: Margaretha Sheffield MD   On: 11/13/2019 15:46   CT CHEST ABDOMEN PELVIS W CONTRAST  Result Date: 11/14/2019 CLINICAL DATA:  Unintentional weight loss EXAM: CT CHEST, ABDOMEN, AND PELVIS WITH CONTRAST TECHNIQUE: Multidetector CT imaging of the chest, abdomen and pelvis was performed following the standard protocol during bolus administration of intravenous contrast. CONTRAST:  157m OMNIPAQUE IOHEXOL 300 MG/ML  SOLN COMPARISON:  Chest radiograph 11/13/2019 FINDINGS: CT CHEST FINDINGS Cardiovascular:  Atherosclerotic calcification of the aortic arch. Mild to moderate cardiomegaly. Mediastinum/Nodes: Prevascular lymph node 0.9 cm in short axis on image 17/3. Right paratracheal lymph node 0.8 cm in short axis on image 18 of series 3. These lymph nodes are indistinct and may be upper normal in size due to passive congestion. Similar scattered small indistinct axillary lymph nodes. Lungs/Pleura: Small left and moderate right pleural effusion with passive atelectasis. Minimal scattered subsegmental atelectasis in the lungs. Musculoskeletal: Diffuse subcutaneous edema. Nonunited fractures of the ninth, tenth, and eleventh ribs posterolaterally with a partially healed fracture of the left twelfth rib posterolaterally. CT ABDOMEN PELVIS FINDINGS Hepatobiliary: Prominence of the lateral segment left hepatic lobe could be a sign of early cirrhosis. No focal liver lesion is identified. Gallbladder unremarkable. Pancreas: Unremarkable Spleen: Unremarkable Adrenals/Urinary Tract: 2.5 by 2.4 cm fluid density lesion exophytic from the left kidney upper pole favoring simple cyst. The adrenal glands appear unremarkable. Stomach/Bowel: Unremarkable Vascular/Lymphatic: No pathologic adenopathy. Reproductive: Unremarkable Other: Diffuse subcutaneous and mesenteric edema. Small amount of ascites along the paracolic gutters. Mild perihepatic and perisplenic ascites. Musculoskeletal: Small left hydrocele along the spermatic cord. Disc bulges at L3-4, L4-5, and L5-S1. IMPRESSION: 1. Diffuse subcutaneous and mesenteric edema with small amount of ascites along the paracolic gutters and mild perihepatic and perisplenic ascites. The appearance is compatible with third spacing of fluid and may be related to the patient's hypoalbuminemia and hypoproteinemia. 2. Mild to moderate cardiomegaly. 3. Small left and moderate right pleural effusions with passive atelectasis. 4. Prominence of the lateral segment left hepatic lobe could be a sign of  early cirrhosis. 5. Nonunited fractures of the left ninth, tenth, and eleventh ribs posterolaterally with a partially healed fracture of the left twelfth rib posterolaterally. 6. Disc bulges at L3-4, L4-5, and L5-S1. 7. Small left hydrocele along the spermatic cord. 8. Aortic atherosclerosis. Aortic Atherosclerosis (ICD10-I70.0). Electronically Signed   By: WVan ClinesM.D.   On: 11/14/2019 15:07      Medications:     Scheduled Medications: . aspirin EC  81 mg Oral Daily  . enoxaparin (LOVENOX) injection  40 mg Subcutaneous Q24H  . folic acid  1 mg Oral Daily  . furosemide  60 mg Intravenous Q12H  . levothyroxine  25 mcg Oral Daily  . rosuvastatin  10 mg Oral Daily  . sacubitril-valsartan  1 tablet Oral BID  . sodium chloride flush  3 mL Intravenous Q12H     Infusions: . sodium chloride       PRN Medications:  sodium chloride, acetaminophen, ondansetron (ZOFRAN) IV, sodium chloride flush   Assessment/Plan:   1. Acute on chronic systolic HF with marked volume overload - etiology unclear.  - ECG markedly abnormal suggesting ischemic etiology vs infiltrative process. Markedly elevated BNP, marked 1AVB and high lateral qs suggest possible infiltrative process) - auto-immune serologies negative. ESR 5, SPEP/serum light chains negative - plan cMRI and R/L cath tomorrow  - Continue Entresto 24/26 - Continue IV lasix on more  day - Continue spiro 12.5 - hold b-blocker for now with possible low-output and marked 1AVB  2. Weight loss, unintentional - extensive work-up during recent admission was negative - CT C/A/P on 11/14/19 no evidence of malignancy  3. Eosinophilia, mild - AEC 924 currently - parasite work-up last admit was negative - doubt HES  4. Protein-calorie malnutrition, severe - Boost supplements - check pre-albumin in am - dietary consult   5. Hypothyroidism - mild - increase synthroid to 45mg daily  Length of Stay: 2   DGlori Bickers MD 11/15/2019, 9:03 AM  Advanced Heart Failure Team Pager [phone](M-F; 7a - 4p)  [facility] for night-coverage after hours (4p -7a ) and weekends on [email or web address]

## 2019-11-15 NOTE — Progress Notes (Signed)
Pt had a 5 bt run of vtach. Pt asymptomatic. HR 70-80's. Cardiology paged. See new orders.

## 2019-11-16 ENCOUNTER — Inpatient Hospital Stay (HOSPITAL_COMMUNITY): Payer: Medicaid Other

## 2019-11-16 ENCOUNTER — Encounter (HOSPITAL_COMMUNITY)
Admission: EM | Disposition: A | Discharge status: Home / Self Care | Payer: Self-pay | Source: Home / Self Care | Attending: Cardiology

## 2019-11-16 DIAGNOSIS — E876 Hypokalemia: Secondary | ICD-10-CM

## 2019-11-16 DIAGNOSIS — I428 Other cardiomyopathies: Secondary | ICD-10-CM

## 2019-11-16 DIAGNOSIS — E2740 Unspecified adrenocortical insufficiency: Secondary | ICD-10-CM

## 2019-11-16 DIAGNOSIS — I5023 Acute on chronic systolic (congestive) heart failure: Secondary | ICD-10-CM | POA: Diagnosis not present

## 2019-11-16 DIAGNOSIS — I509 Heart failure, unspecified: Secondary | ICD-10-CM

## 2019-11-16 HISTORY — PX: RIGHT/LEFT HEART CATH AND CORONARY ANGIOGRAPHY: CATH118266

## 2019-11-16 LAB — POCT I-STAT EG7
Acid-Base Excess: 10 mmol/L — ABNORMAL HIGH (ref 0.0–2.0)
Acid-Base Excess: 8 mmol/L — ABNORMAL HIGH (ref 0.0–2.0)
Bicarbonate: 37.3 mmol/L — ABNORMAL HIGH (ref 20.0–28.0)
Bicarbonate: 35.7 mmol/L — ABNORMAL HIGH (ref 20.0–28.0)
Calcium, Ion: 1.12 mmol/L — ABNORMAL LOW (ref 1.15–1.40)
Calcium, Ion: 1.11 mmol/L — ABNORMAL LOW (ref 1.15–1.40)
HCT: 40 % (ref 39.0–52.0)
HCT: 40 % (ref 39.0–52.0)
Hemoglobin: 13.6 g/dL (ref 13.0–17.0)
Hemoglobin: 13.6 g/dL (ref 13.0–17.0)
O2 Saturation: 69 %
O2 Saturation: 70 %
Potassium: 3.8 mmol/L (ref 3.5–5.1)
Potassium: 3.7 mmol/L (ref 3.5–5.1)
Sodium: 141 mmol/L (ref 135–145)
Sodium: 140 mmol/L (ref 135–145)
TCO2: 39 mmol/L — ABNORMAL HIGH (ref 22–32)
TCO2: 37 mmol/L — ABNORMAL HIGH (ref 22–32)
pCO2, Ven: 63.5 mmHg — ABNORMAL HIGH (ref 44.0–60.0)
pCO2, Ven: 60.5 mmHg — ABNORMAL HIGH (ref 44.0–60.0)
pH, Ven: 7.377 (ref 7.250–7.430)
pH, Ven: 7.378 (ref 7.250–7.430)
pO2, Ven: 39 mmHg (ref 32.0–45.0)
pO2, Ven: 39 mmHg (ref 32.0–45.0)

## 2019-11-16 LAB — CBC
HCT: 41.4 % (ref 39.0–52.0)
Hemoglobin: 13.6 g/dL (ref 13.0–17.0)
MCH: 24.7 pg — ABNORMAL LOW (ref 26.0–34.0)
MCHC: 32.9 g/dL (ref 30.0–36.0)
MCV: 75.3 fL — ABNORMAL LOW (ref 80.0–100.0)
Platelets: 341 10*3/uL (ref 150–400)
RBC: 5.5 MIL/uL (ref 4.22–5.81)
RDW: 18.5 % — ABNORMAL HIGH (ref 11.5–15.5)
WBC: 6.7 10*3/uL (ref 4.0–10.5)
nRBC: 0 % (ref 0.0–0.2)

## 2019-11-16 LAB — POCT I-STAT 7, (LYTES, BLD GAS, ICA,H+H)
Acid-Base Excess: 10 mmol/L — ABNORMAL HIGH (ref 0.0–2.0)
Bicarbonate: 36.8 mmol/L — ABNORMAL HIGH (ref 20.0–28.0)
Calcium, Ion: 1.01 mmol/L — ABNORMAL LOW (ref 1.15–1.40)
HCT: 39 % (ref 39.0–52.0)
Hemoglobin: 13.3 g/dL (ref 13.0–17.0)
O2 Saturation: 99 %
Potassium: 3.6 mmol/L (ref 3.5–5.1)
Sodium: 142 mmol/L (ref 135–145)
TCO2: 38 mmol/L — ABNORMAL HIGH (ref 22–32)
pCO2 arterial: 57.9 mmHg — ABNORMAL HIGH (ref 32.0–48.0)
pH, Arterial: 7.411 (ref 7.350–7.450)
pO2, Arterial: 124 mmHg — ABNORMAL HIGH (ref 83.0–108.0)

## 2019-11-16 LAB — BASIC METABOLIC PANEL
Anion gap: 10 (ref 5–15)
BUN: <5 mg/dL — ABNORMAL LOW (ref 8–23)
CO2: 34 mmol/L — ABNORMAL HIGH (ref 22–32)
Calcium: 8.3 mg/dL — ABNORMAL LOW (ref 8.9–10.3)
Chloride: 95 mmol/L — ABNORMAL LOW (ref 98–111)
Creatinine, Ser: 0.9 mg/dL (ref 0.61–1.24)
GFR, Estimated: >60 mL/min (ref >60)
Glucose, Bld: 75 mg/dL (ref 70–99)
Potassium: 3.4 mmol/L — ABNORMAL LOW (ref 3.5–5.1)
Sodium: 139 mmol/L (ref 135–145)

## 2019-11-16 LAB — MAGNESIUM: Magnesium: 1.7 mg/dL (ref 1.7–2.4)

## 2019-11-16 LAB — CORTISOL: Cortisol, Plasma: 0.4 ug/dL

## 2019-11-16 LAB — PREALBUMIN: Prealbumin: 8.8 mg/dL — ABNORMAL LOW (ref 18–38)

## 2019-11-16 IMAGING — MR MR CARD MORPHOLOGY WO/W CM
45 of 48 series · 45 of 48 positions shown · IV contrast (Contrast agent)
Comparison: none

CLINICAL DATA: Nonischemic cardiomyopathy

EXAM:
CARDIAC MRI
TECHNIQUE: The patient was scanned on a 1.5 Tesla GE magnet. A dedicated
cardiac coil was used. Functional imaging was done using Fiesta
sequences. [DATE], and 4 chamber views were done to assess for RWMA's.
Modified YANETSY rule using a short axis stack was used to
calculate an ejection fraction on a dedicated work station using
Circle software. The patient received 8 cc of Gadavist. After 10
minutes inversion recovery sequences were used to assess for
infiltration and scar tissue.

[Series 4: t2_haste_db_tra_bh · axial · 8.0mm · 1.41mm/px · 1 of 20 slices shown]
[im 1/20]
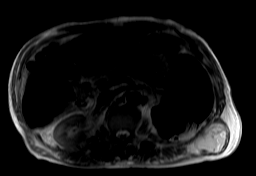

[Series 8: bSSFP · oblique · 8.0mm · 1.61mm/px · 1 of 25 slices shown (1 of 21)]
[im 1/25]
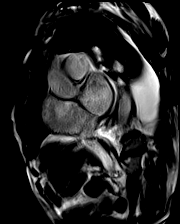

[Series 9: bSSFP · oblique · 8.0mm · 1.61mm/px · 1 of 25 slices shown (2 of 21)]
[im 1/25]
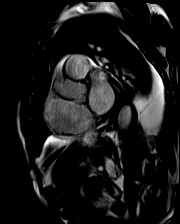

[Series 10: bSSFP · oblique · 8.0mm · 1.61mm/px · 1 of 25 slices shown (3 of 21)]
[im 1/25]
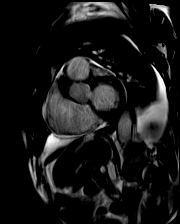

[Series 11: bSSFP · oblique · 8.0mm · 1.61mm/px · 1 of 25 slices shown (4 of 21)]
[im 1/25]
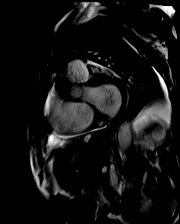

[Series 12: bSSFP · oblique · 8.0mm · 1.61mm/px · 1 of 25 slices shown (5 of 21)]
[im 1/25]
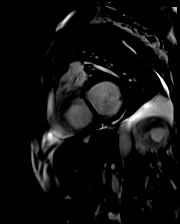

[Series 13: bSSFP · oblique · 8.0mm · 1.61mm/px · 1 of 25 slices shown (6 of 21)]
[im 1/25]
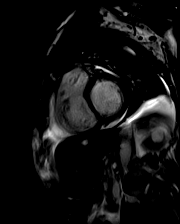

[Series 14: bSSFP · oblique · 8.0mm · 1.61mm/px · 1 of 25 slices shown (7 of 21)]
[im 1/25]
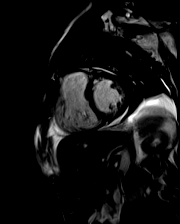

[Series 15: bSSFP · oblique · 8.0mm · 1.61mm/px · 1 of 25 slices shown (8 of 21)]
[im 1/25]
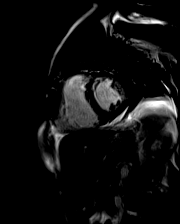

[Series 16: bSSFP · oblique · 8.0mm · 1.61mm/px · 1 of 25 slices shown (9 of 21)]
[im 1/25]
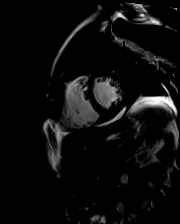

[Series 17: bSSFP · oblique · 8.0mm · 1.61mm/px · 1 of 25 slices shown (10 of 21)]
[im 1/25]
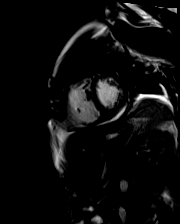

[Series 18: bSSFP · oblique · 8.0mm · 1.61mm/px · 1 of 25 slices shown (11 of 21)]
[im 1/25]
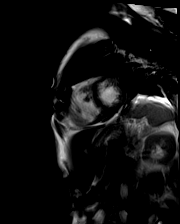

[Series 19: bSSFP · oblique · 8.0mm · 1.61mm/px · 1 of 25 slices shown (12 of 21)]
[im 1/25]
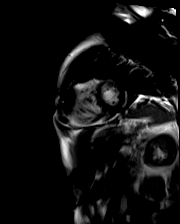

[Series 20: bSSFP · oblique · 8.0mm · 1.61mm/px · 1 of 25 slices shown (13 of 21)]
[im 1/25]
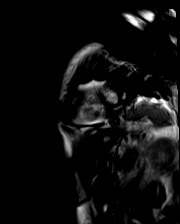

[Series 21: bSSFP · oblique · 8.0mm · 1.61mm/px · 1 of 25 slices shown (14 of 21)]
[im 1/25]
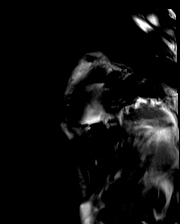

[Series 22: bSSFP · oblique · 8.0mm · 1.61mm/px · 1 of 25 slices shown (15 of 21)]
[im 1/25]
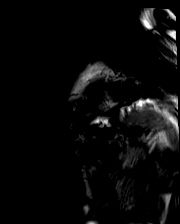

[Series 23: bSSFP · oblique · 8.0mm · 1.61mm/px · 1 of 25 slices shown (16 of 21)]
[im 1/25]
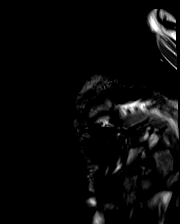

[Series 24: bSSFP · oblique · 8.0mm · 1.61mm/px · 1 of 25 slices shown (17 of 21)]
[im 1/25]
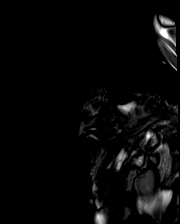

[Series 25: (id)_long_t1 · oblique · 8.0mm · 2.08mm/px · 1 of 24 slices shown]
[im 1/24]
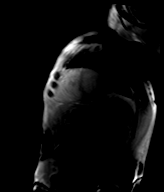

[Series 26: (id)_long_t1_moco · oblique · 8.0mm · 2.08mm/px · 1 of 24 slices shown]
[im 1/24]
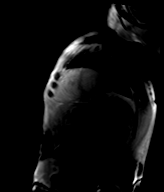

[Series 27: (id)_long_t1_moco_t1 · oblique · 8.0mm · 2.08mm/px · 1 of 6 slices shown]
[im 1/6]
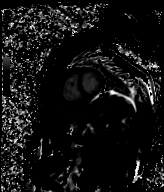

[Series 29: (id)_trufi · oblique · 8.0mm · 2.08mm/px · 1 of 9 slices shown]
[im 1/9]
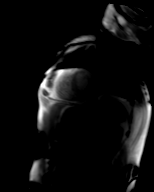

[Series 30: (id)_trufi_moco · oblique · 8.0mm · 2.08mm/px · 1 of 9 slices shown]
[im 1/9]
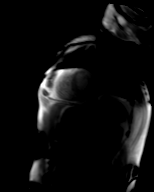

[Series 33: bSSFP · oblique · 6.0mm · 1.41mm/px · 1 of 25 slices shown (18 of 21)]
[im 1/25]
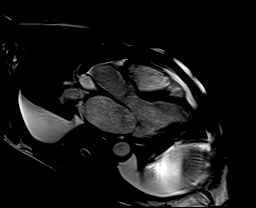

[Series 34: bSSFP · axial · 6.0mm · 1.41mm/px · 1 of 25 slices shown (19 of 21)]
[im 1/25]
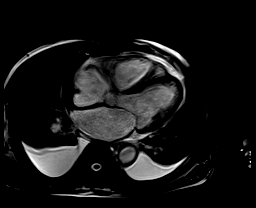

[Series 35: cine_trufi_cs_rt_short axis · oblique · 8.0mm · 1.73mm/px · 1 of 49 slices shown (1 of 18)]
[im 1/49]
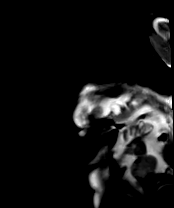

[Series 35: cine_trufi_cs_rt_short axis · oblique · 8.0mm · 1.73mm/px · 1 of 49 slices shown (2 of 18)]
[im 1/49]
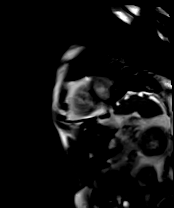

[Series 35: cine_trufi_cs_rt_short axis · oblique · 8.0mm · 1.73mm/px · 1 of 49 slices shown (3 of 18)]
[im 1/49]
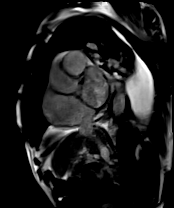

[Series 35: cine_trufi_cs_rt_short axis · oblique · 8.0mm · 1.73mm/px · 1 of 49 slices shown (4 of 18)]
[im 1/49]
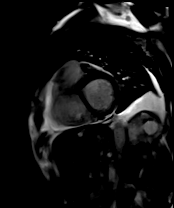

[Series 35: cine_trufi_cs_rt_short axis · oblique · 8.0mm · 1.73mm/px · 1 of 49 slices shown (5 of 18)]
[im 1/49]
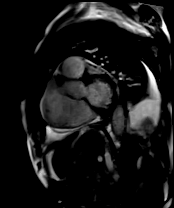

[Series 35: cine_trufi_cs_rt_short axis · oblique · 8.0mm · 1.73mm/px · 1 of 49 slices shown (6 of 18)]
[im 1/49]
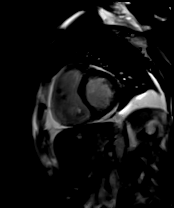

[Series 35: cine_trufi_cs_rt_short axis · oblique · 8.0mm · 1.73mm/px · 1 of 49 slices shown (7 of 18)]
[im 1/49]
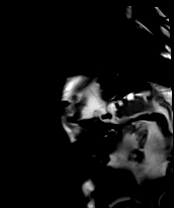

[Series 35: cine_trufi_cs_rt_short axis · oblique · 8.0mm · 1.73mm/px · 1 of 49 slices shown (8 of 18)]
[im 1/49]
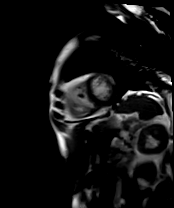

[Series 35: cine_trufi_cs_rt_short axis · oblique · 8.0mm · 1.73mm/px · 1 of 49 slices shown (9 of 18)]
[im 1/49]
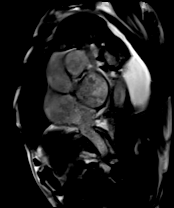

[Series 35: cine_trufi_cs_rt_short axis · oblique · 8.0mm · 1.73mm/px · 1 of 49 slices shown (10 of 18)]
[im 1/49]
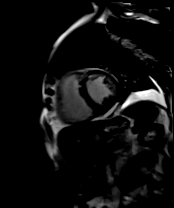

[Series 35: cine_trufi_cs_rt_short axis · oblique · 8.0mm · 1.73mm/px · 1 of 49 slices shown (11 of 18)]
[im 1/49]
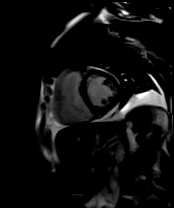

[Series 35: cine_trufi_cs_rt_short axis · oblique · 8.0mm · 1.73mm/px · 1 of 49 slices shown (12 of 18)]
[im 1/49]
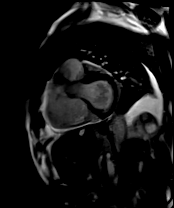

[Series 35: cine_trufi_cs_rt_short axis · oblique · 8.0mm · 1.73mm/px · 1 of 49 slices shown (13 of 18)]
[im 1/49]
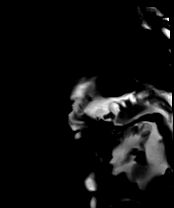

[Series 35: cine_trufi_cs_rt_short axis · oblique · 8.0mm · 1.73mm/px · 1 of 49 slices shown (14 of 18)]
[im 1/49]
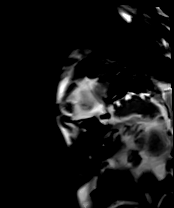

[Series 35: cine_trufi_cs_rt_short axis · oblique · 8.0mm · 1.73mm/px · 1 of 49 slices shown (15 of 18)]
[im 1/49]
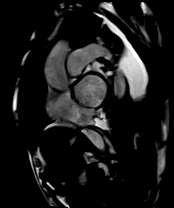

[Series 35: cine_trufi_cs_rt_short axis · oblique · 8.0mm · 1.73mm/px · 1 of 49 slices shown (16 of 18)]
[im 1/49]
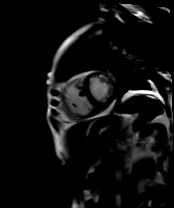

[Series 35: cine_trufi_cs_rt_short axis · oblique · 8.0mm · 1.73mm/px · 1 of 49 slices shown (17 of 18)]
[im 1/49]
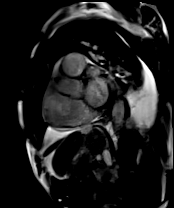

[Series 35: cine_trufi_cs_rt_short axis · oblique · 8.0mm · 1.73mm/px · 1 of 49 slices shown (18 of 18)]
[im 1/49]
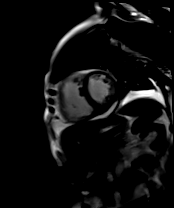

[Series 36: bSSFP · coronal · 6.0mm · 1.41mm/px · 1 of 25 slices shown (20 of 21)]
[im 1/25]
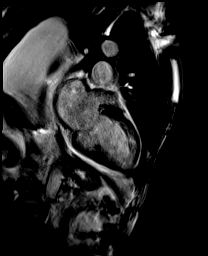

[Series 37: bSSFP · axial · 6.0mm · 1.41mm/px · 1 of 25 slices shown (21 of 21)]
[im 1/25]
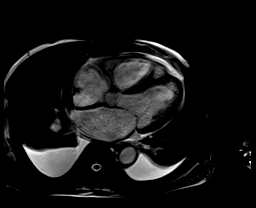

[45 of 48 positions shown; findings below may reference images not displayed]

FINDINGS: Moderate bilateral pleural effusions with bibasilar atelectasis.

Small circumferential pericardial effusion. Normal left ventricular
size with normal wall thickness, EF 37% with diffuse hypokinesis.
Moderately dilated right ventricle with EF 34% (moderately
decreased). Severely dilated left atrium, moderately dilated right
atrium. Trileaflet aortic valve without significant regurgitation or
stenosis. No significant mitral regurgitation. Tricuspid
regurgitation appears mild to moderate.

On delayed enhancement imaging, there is very extensive mid-wall
late gadolinium enhancement (LGE) diffusely throughout the left
ventricle.

Measurements:

Extracellular fluid volume %: 35%

LVEDV 157 mL

LVSV 59 mL
LVEF 37%

RVEDV 224 mL
RVSV 77 mL
RVEF 34%
IMPRESSION: 1.  Bilateral moderate pleural effusions.

2.  Normal LV size with EF 37%, diffuse hypokinesis.

3. Moderately dilated RV with EF 34%, moderately decreased systolic
function.

4.  Biatrial enlargement.

5. Non-coronary pattern LGE. The LGE is extensive and mid-wall,
suggesting possible prior myocarditis versus infiltrative disease.
Lack of LV hypertrophy and ECV % < 40% makes cardiac amyloidosis
less likely.

YANETSY

## 2019-11-16 IMAGING — MR MR HEAD WO/W CM
14 of 20 series · 32 of 48 positions shown · IV contrast (gadavist)
Comparison: None.

CLINICAL DATA: Unintentional weight loss. Concern for underlying
malignancy.

EXAM:
MRI HEAD WITHOUT AND WITH CONTRAST
TECHNIQUE: Multiplanar, multiecho pulse sequences of the brain and surrounding
structures were obtained without and with intravenous contrast.
CONTRAST:  6mL GADAVIST GADOBUTROL 1 MMOL/ML IV SOLN

[Series 5: DWI · axial · 3.0mm · 0.92mm/px · z∈[-123,+19]mm · 4 of 100 slices shown (1 of 4)]
[im 1/100]
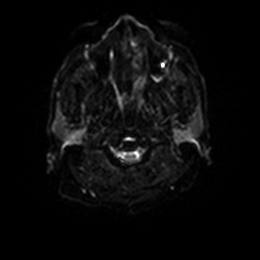
[im 34/100]
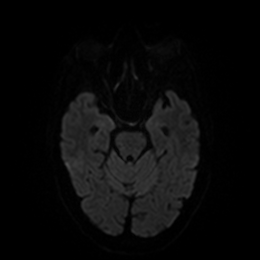
[im 67/100]
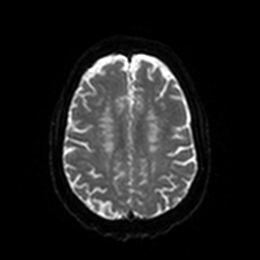
[im 100/100]
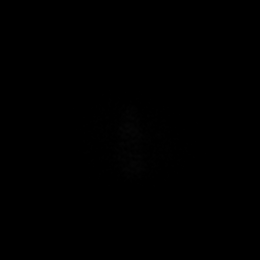

[Series 6: DWI · axial · 3.0mm · 0.92mm/px · z∈[-123,+19]mm · 2 of 50 slices shown (2 of 4)]
[im 1/50]
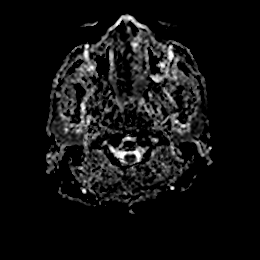
[im 50/50]
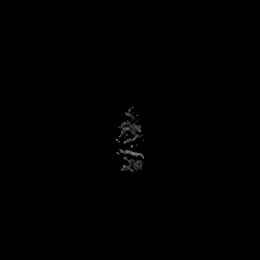

[Series 7: DWI · coronal · 4.0mm · 0.88mm/px · 4 of 72 slices shown (3 of 4)]
[im 1/72]
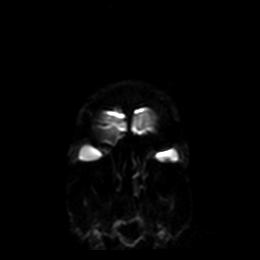
[im 24/72]
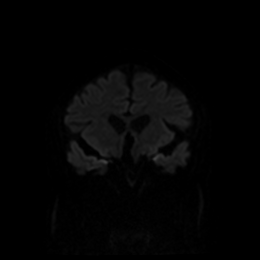
[im 48/72]
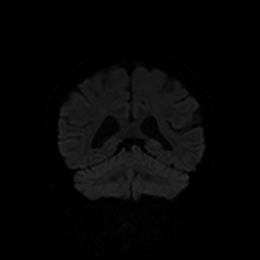
[im 72/72]
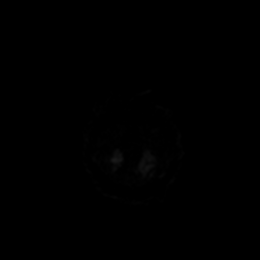

[Series 8: DWI · coronal · 4.0mm · 0.88mm/px · 2 of 36 slices shown (4 of 4)]
[im 1/36]
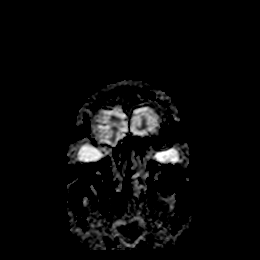
[im 36/36]
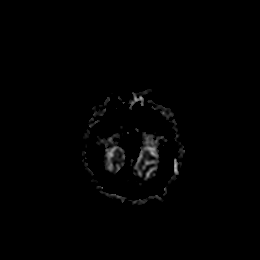

[Series 9: T1 · sagittal · 5.0mm · 0.75mm/px · 1 of 23 slices shown]
[im 1/23]
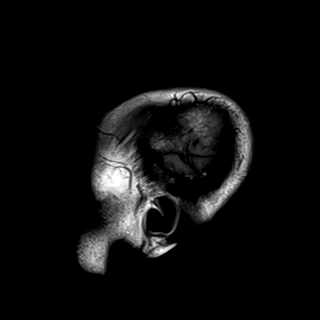

[Series 10: T2 · axial · 5.0mm · 0.72mm/px · 1 of 25 slices shown (1 of 2)]
[im 1/25]
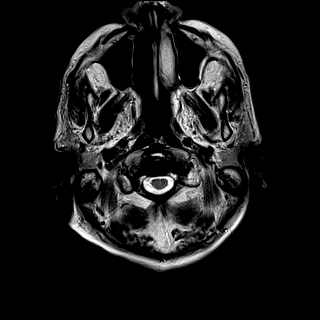

[Series 11: FLAIR · axial · 5.0mm · 0.45mm/px · 1 of 25 slices shown]
[im 1/25]
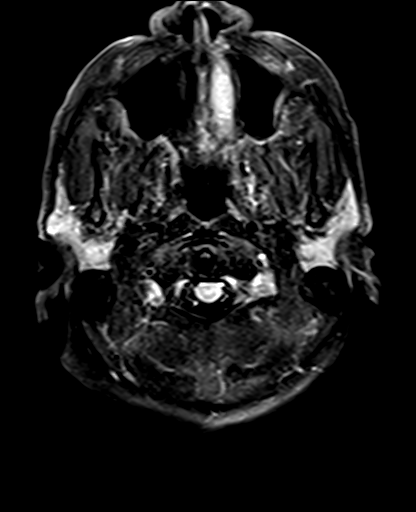

[Series 12: mag_images · axial · 3.0mm · 0.90mm/px · z∈[-132,+39]mm · 3 of 60 slices shown]
[im 1/60]
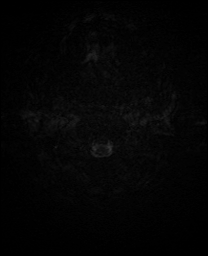
[im 30/60]
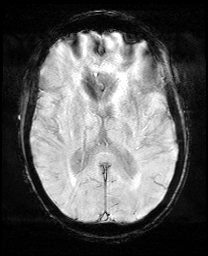
[im 60/60]
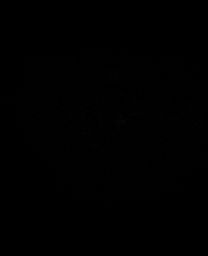

[Series 13: pha_images · axial · 3.0mm · 0.90mm/px · z∈[-129,+21]mm · 3 of 53 slices shown]
[im 1/53]
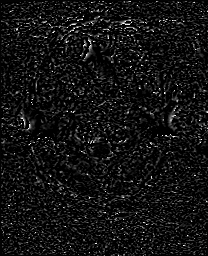
[im 27/53]
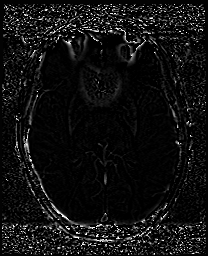
[im 53/53]
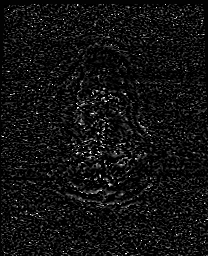

[Series 14: swi_images · axial · 3.0mm · 0.90mm/px · z∈[-132,+39]mm · 3 of 60 slices shown]
[im 1/60]
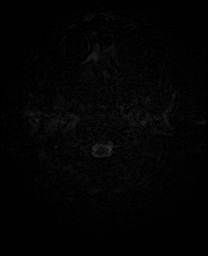
[im 30/60]
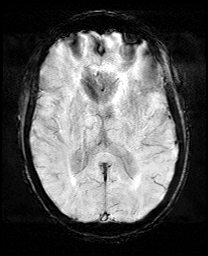
[im 60/60]
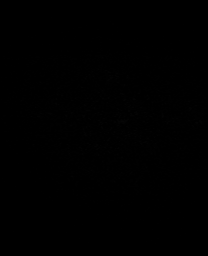

[Series 15: mip_images(sw) · axial · 24.0mm · 0.90mm/px · z∈[-122,+28]mm · 3 of 53 slices shown]
[im 1/53]
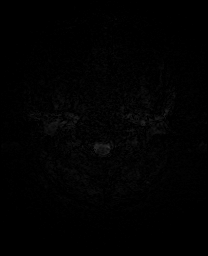
[im 27/53]
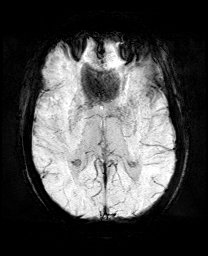
[im 53/53]
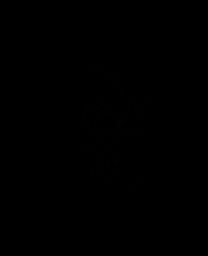

[Series 17: T2 · coronal · 5.0mm · 0.34mm/px · 2 of 29 slices shown (2 of 2)]
[im 1/29]
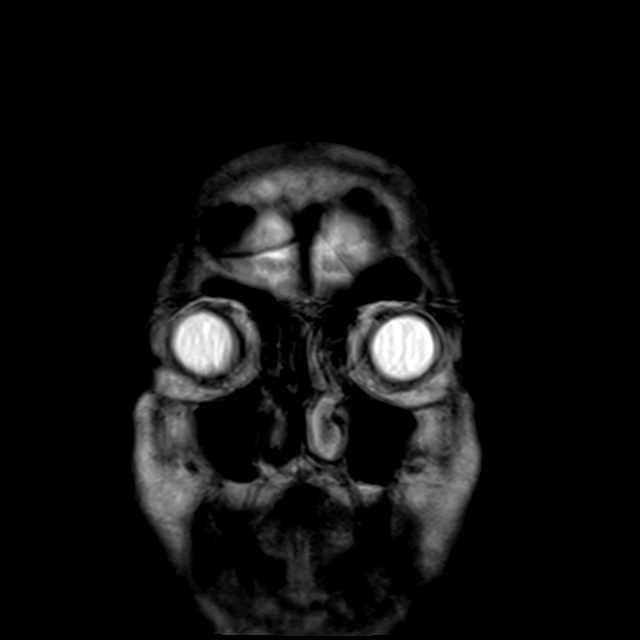
[im 29/29]
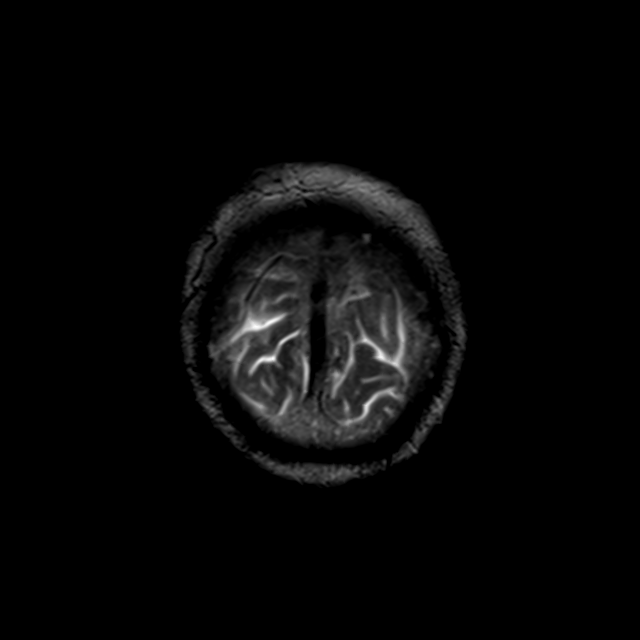

[Series 23: T1 post-contrast · coronal · 5.0mm · 0.34mm/px · 2 of 30 slices shown (1 of 2)]
[im 1/30]
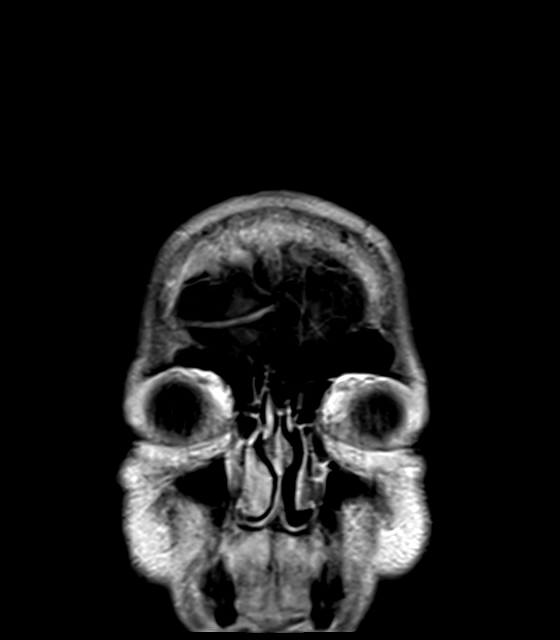
[im 30/30]
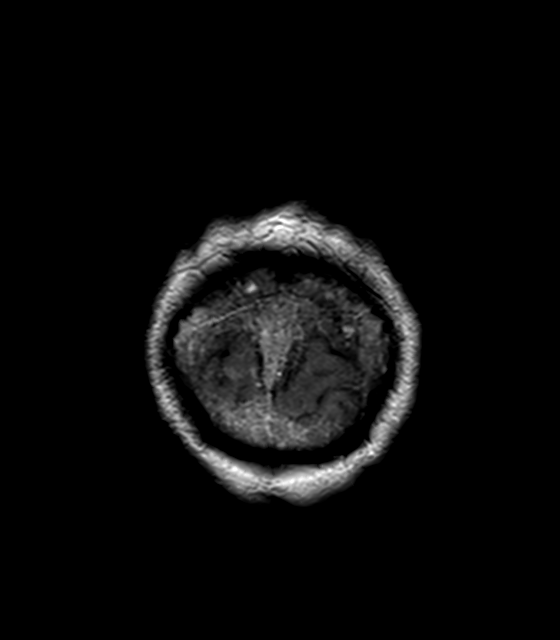

[Series 24: T1 post-contrast · sagittal · 5.0mm · 0.72mm/px · 1 of 23 slices shown (2 of 2)]
[im 1/23]
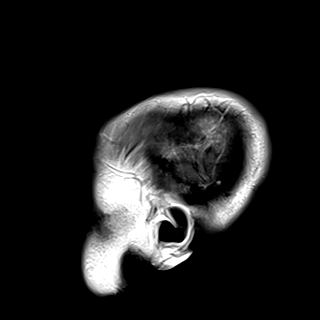

[32 of 48 positions shown; findings below may reference images not displayed]

FINDINGS: Brain: Diffusion imaging does not show any acute or subacute
infarction. No abnormality affects the brainstem or cerebellum.
Cerebral hemispheres show moderate chronic small-vessel ischemic
changes of the deep white matter. No cortical or large vessel
territory infarction. No evidence of primary or metastatic mass
lesion, hemorrhage, hydrocephalus or extra-axial collection. No
abnormal brain or leptomeningeal enhancement occurs. No abnormality
of the hypothalamus or pituitary gland.

Vascular: Major vessels at the base of the brain show flow.

Skull and upper cervical spine: Considerable degenerative changes of
the upper cervical spine. Some degree of spinal stenosis at C2-3,
not primarily or completely evaluated.

Sinuses/Orbits: Clear sinuses.  Previous lens implant on the right.

Other: None
IMPRESSION: 1. No acute or reversible finding. No evidence of metastatic
disease.
2. Moderate chronic small-vessel ischemic changes of the cerebral
hemispheric deep white matter.
3. Considerable degenerative changes of the upper cervical spine.
Some degree of spinal stenosis at C2-3, not primarily or completely
evaluated.

## 2019-11-16 SURGERY — RIGHT/LEFT HEART CATH AND CORONARY ANGIOGRAPHY
Anesthesia: LOCAL

## 2019-11-16 MED ORDER — SPIRONOLACTONE 12.5 MG HALF TABLET
12.5000 mg | ORAL_TABLET | Freq: Every day | ORAL | Status: DC
  Administered 2019-11-17 (×2): 12.5 mg via ORAL
  Filled 2019-11-16 (×2): qty 1

## 2019-11-16 MED ORDER — SODIUM CHLORIDE 0.9 % IV SOLN: INTRAVENOUS | Status: AC

## 2019-11-16 MED ORDER — FENTANYL CITRATE (PF) 100 MCG/2ML IJ SOLN
INTRAMUSCULAR | Status: AC
  Filled 2019-11-16: qty 2

## 2019-11-16 MED ORDER — HEPARIN SODIUM (PORCINE) 1000 UNIT/ML IJ SOLN
INTRAMUSCULAR | Status: DC | PRN
  Administered 2019-11-16: 3000 [IU] via INTRAVENOUS

## 2019-11-16 MED ORDER — HEPARIN SODIUM (PORCINE) 1000 UNIT/ML IJ SOLN
INTRAMUSCULAR | Status: AC
  Filled 2019-11-16: qty 1

## 2019-11-16 MED ORDER — LABETALOL HCL 5 MG/ML IV SOLN: 10.0000 mg | INTRAVENOUS | Status: AC | PRN

## 2019-11-16 MED ORDER — SODIUM CHLORIDE 0.9 % IV SOLN: 250.0000 mL | INTRAVENOUS | Status: DC | PRN

## 2019-11-16 MED ORDER — ACETAMINOPHEN 325 MG PO TABS: 650.0000 mg | ORAL_TABLET | ORAL | Status: DC | PRN

## 2019-11-16 MED ORDER — ENSURE ENLIVE PO LIQD
237.0000 mL | Freq: Two times a day (BID) | ORAL | Status: DC
  Administered 2019-11-17 – 2019-11-20 (×7): 237 mL via ORAL

## 2019-11-16 MED ORDER — MIDAZOLAM HCL 2 MG/2ML IJ SOLN
INTRAMUSCULAR | Status: AC
  Filled 2019-11-16: qty 2

## 2019-11-16 MED ORDER — HEPARIN (PORCINE) IN NACL 1000-0.9 UT/500ML-% IV SOLN
INTRAVENOUS | Status: AC
  Filled 2019-11-16: qty 1000

## 2019-11-16 MED ORDER — HYDRALAZINE HCL 20 MG/ML IJ SOLN: 10.0000 mg | INTRAMUSCULAR | Status: AC | PRN

## 2019-11-16 MED ORDER — SODIUM CHLORIDE 0.9% FLUSH
3.0000 mL | INTRAVENOUS | Status: DC | PRN
  Administered 2019-11-17: 3 mL via INTRAVENOUS

## 2019-11-16 MED ORDER — MAGNESIUM SULFATE 2 GM/50ML IV SOLN
2.0000 g | Freq: Once | INTRAVENOUS | Status: AC
  Administered 2019-11-16: 2 g via INTRAVENOUS
  Filled 2019-11-16: qty 50

## 2019-11-16 MED ORDER — LIDOCAINE HCL (PF) 1 % IJ SOLN
INTRAMUSCULAR | Status: DC | PRN
  Administered 2019-11-16 (×2): 2 mL

## 2019-11-16 MED ORDER — IOHEXOL 350 MG/ML SOLN
INTRAVENOUS | Status: DC | PRN
  Administered 2019-11-16: 40 mL

## 2019-11-16 MED ORDER — HYDROCORTISONE 10 MG PO TABS
5.0000 mg | ORAL_TABLET | Freq: Every evening | ORAL | Status: DC
  Administered 2019-11-17 – 2019-11-19 (×3): 5 mg via ORAL
  Filled 2019-11-16: qty 0.5

## 2019-11-16 MED ORDER — VERAPAMIL HCL 2.5 MG/ML IV SOLN
INTRAVENOUS | Status: DC | PRN
  Administered 2019-11-16: 10 mL via INTRA_ARTERIAL

## 2019-11-16 MED ORDER — VERAPAMIL HCL 2.5 MG/ML IV SOLN
INTRAVENOUS | Status: AC
  Filled 2019-11-16: qty 2

## 2019-11-16 MED ORDER — HYDROCORTISONE 10 MG PO TABS
10.0000 mg | ORAL_TABLET | Freq: Every morning | ORAL | Status: DC
  Administered 2019-11-17 – 2019-11-20 (×4): 10 mg via ORAL
  Filled 2019-11-16 (×7): qty 1

## 2019-11-16 MED ORDER — HEPARIN (PORCINE) IN NACL 1000-0.9 UT/500ML-% IV SOLN
INTRAVENOUS | Status: DC | PRN
  Administered 2019-11-16 (×2): 500 mL

## 2019-11-16 MED ORDER — POTASSIUM CHLORIDE CRYS ER 20 MEQ PO TBCR
40.0000 meq | EXTENDED_RELEASE_TABLET | Freq: Once | ORAL | Status: AC
  Administered 2019-11-16: 40 meq via ORAL
  Filled 2019-11-16: qty 2

## 2019-11-16 MED ORDER — GADOBUTROL 1 MMOL/ML IV SOLN
6.0000 mL | Freq: Once | INTRAVENOUS | Status: AC | PRN
  Administered 2019-11-16: 6 mL via INTRAVENOUS

## 2019-11-16 MED ORDER — SODIUM CHLORIDE 0.9% FLUSH
3.0000 mL | Freq: Two times a day (BID) | INTRAVENOUS | Status: DC
  Administered 2019-11-17 – 2019-11-20 (×6): 3 mL via INTRAVENOUS

## 2019-11-16 MED ORDER — ADULT MULTIVITAMIN W/MINERALS CH
1.0000 | ORAL_TABLET | Freq: Every day | ORAL | Status: DC
  Administered 2019-11-17 – 2019-11-20 (×5): 1 via ORAL
  Filled 2019-11-16 (×5): qty 1

## 2019-11-16 MED ORDER — LIDOCAINE HCL (PF) 1 % IJ SOLN
INTRAMUSCULAR | Status: AC
  Filled 2019-11-16: qty 30

## 2019-11-16 MED ORDER — ENOXAPARIN SODIUM 40 MG/0.4ML ~~LOC~~ SOLN
40.0000 mg | SUBCUTANEOUS | Status: DC
  Administered 2019-11-17 – 2019-11-20 (×4): 40 mg via SUBCUTANEOUS
  Filled 2019-11-16 (×4): qty 0.4

## 2019-11-16 MED ORDER — ONDANSETRON HCL 4 MG/2ML IJ SOLN: 4.0000 mg | Freq: Four times a day (QID) | INTRAMUSCULAR | Status: DC | PRN

## 2019-11-16 SURGICAL SUPPLY — 12 items
CATH 5FR JL3.5 JR4 ANG PIG MP (CATHETERS) ×1 IMPLANT
CATH SWAN GANZ 7F STRAIGHT (CATHETERS) ×1 IMPLANT
DEVICE RAD COMP TR BAND LRG (VASCULAR PRODUCTS) ×1 IMPLANT
GLIDESHEATH SLEND SS 6F .021 (SHEATH) ×1 IMPLANT
GLIDESHEATH SLENDER 7FR .021G (SHEATH) ×1 IMPLANT
GUIDEWIRE .025 260CM (WIRE) ×1 IMPLANT
GUIDEWIRE INQWIRE 1.5J.035X260 (WIRE) IMPLANT
INQWIRE 1.5J .035X260CM (WIRE) ×2
KIT MICROPUNCTURE NIT STIFF (SHEATH) ×1 IMPLANT
PACK CARDIAC CATHETERIZATION (CUSTOM PROCEDURE TRAY) ×2 IMPLANT
SHEATH PROBE COVER 6X72 (BAG) ×1 IMPLANT
TRANSDUCER W/STOPCOCK (MISCELLANEOUS) ×2 IMPLANT

## 2019-11-16 NOTE — Progress Notes (Signed)
Initial Nutrition Assessment  DOCUMENTATION CODES:   Non-severe (moderate) malnutrition in context of chronic illness  INTERVENTION:   -Ensure Enlive po BID, each supplement provides 350 kcal and 20 grams of protein -MVI with minerals daily -Vegetarian diet per pt and family request  NUTRITION DIAGNOSIS:   Moderate Malnutrition related to chronic illness (CHF) as evidenced by mild muscle depletion, moderate muscle depletion, mild fat depletion, moderate fat depletion.  GOAL:   Patient will meet greater than or equal to 90% of their needs  MONITOR:   PO intake, Supplement acceptance, Labs, Weight trends, Skin, I & O's  REASON FOR ASSESSMENT:   Consult Assessment of nutrition requirement/status  ASSESSMENT:   Steven Frazier is a 64 y.o. male with history of HF with borderline EF (40-45%), HTN and hypothroidism who presented to clinic with worsening shortness of breath, LE edema, abdominal distension and orthopnea found to be grossly volume overloaded with NYHA class III symptoms now admitted for acute on chronic HF exacerbation.  Pt admitted with CHF exacerbation.  Reviewed I/O's: -1.7 L x 24 hours and -9.1 L since admission  UOP: 2.3 L x 24 hours  Plan for cardiac cath today.   Spoke with pt and daughter at bedside. Pt sleepy at time of visit and deferred most of the interview to daughter. Daughter reports that pt has experienced a general decline in health over the past 2 months, when he was hospitalized back in August. She shares that pt was weak when he returned home from the hospital, but was able to ambulate and heat food up in the microwave (pt unable to cook meals, as standing was too exerting for him).   PTA, pt was consuming 3 meals per day and had a good appetite (meals provided by pt wife and daughters- Breakfast: oat or cream of wheat with fruit; Lunch and Dinner: rice, beans, and brocooli). Daughter explains that pt is a vegetarian and does not eat meat, fish, or  eggs. He does like almond milk, soy milk, and strawberry Ensure. Noted meal completion has been variable; po 25-75%. Pt daughter shares that pt was able to eat better yesterday after being provided a vegetarian diet (previously pt was provided foods that he did not eat, such as eggs).   Pt UBE is around 165#. Pt daughter endorses wt loss, but unsure how much related to edema. Noted no wt loss since August, however, noted a 9.6% wt loss over the past 2 days. Unsure of accuracy of weights.   Discussed with pt and daughter importance of good meal and supplement intake to promote healing. They are amenable to strawberry Ensure.   Medications reviewed and include folic acid, lasix,and KCL.   Labs reviewed.   NUTRITION - FOCUSED PHYSICAL EXAM:    Most Recent Value  Orbital Region No depletion  Upper Arm Region Moderate depletion  Thoracic and Lumbar Region Mild depletion  Buccal Region Mild depletion  Temple Region Mild depletion  Clavicle Bone Region Mild depletion  Clavicle and Acromion Bone Region Mild depletion  Scapular Bone Region Mild depletion  Dorsal Hand Mild depletion  Patellar Region Moderate depletion  Anterior Thigh Region Moderate depletion  Posterior Calf Region Moderate depletion  Edema (RD Assessment) Mild  Hair Reviewed  Eyes Reviewed  Mouth Reviewed  Skin Reviewed  Nails Reviewed       Diet Order:   Diet Order            Diet vegetarian Room service appropriate? Yes; Fluid consistency: Thin  Diet effective now                 EDUCATION NEEDS:   Education needs have been addressed  Skin:  Skin Assessment: Reviewed RN Assessment  Last BM:  11/15/19  Height:   Ht Readings from Last 1 Encounters:  11/13/19 5\' 5"  (1.651 m)    Weight:   Wt Readings from Last 1 Encounters:  11/15/19 63.6 kg    Ideal Body Weight:  61.8 kg  BMI:  Body mass index is 23.35 kg/m.  Estimated Nutritional Needs:   Kcal:  1850-2050  Protein:  105-120  grams  Fluid:  2 L    11/17/19, RD, LDN, CDCES Registered Dietitian II Certified Diabetes Care and Education Specialist Please refer to [facility] for RD and/or RD on-call/weekend/after hours pager

## 2019-11-16 NOTE — Interval H&P Note (Signed)
History and Physical Interval Note:  11/16/2019 4:14 PM  Steven Frazier  has presented today for surgery, with the diagnosis of unstable angina.  The various methods of treatment have been discussed with the patient and family. After consideration of risks, benefits and other options for treatment, the patient has consented to  Procedure(s): RIGHT/LEFT HEART CATH AND CORONARY ANGIOGRAPHY (N/A) and possible coronary angioplasty as a surgical intervention.  The patient's history has been reviewed, patient examined, no change in status, stable for surgery.  I have reviewed the patient's chart and labs.  Questions were answered to the patient's satisfaction.     [name]

## 2019-11-16 NOTE — Plan of Care (Signed)

## 2019-11-16 NOTE — Progress Notes (Addendum)
  Advanced Heart Failure Rounding Note   Subjective:    CT C/A/P 10/16 with evidence of anasarca. No malignancy or lymphadenopathy.   -2.3L in UOP yesterday. Wt not charted today.   He reports breathing is not back to 100% normal but has improved since admit. OOB sitting up in chair. BP is ok. On for R/LCH later today.   Plasma Cortisol level is markedly low at 0.4.   Objective:   Weight Range:  Vital Signs:   Temp:  [97.7 F (36.5 C)-98.7 F (37.1 C)] 98.1 F (36.7 C) (10/18 0741) Pulse Rate:  [74-79] 76 (10/18 0741) Resp:  [16-20] 20 (10/18 0741) BP: (119-143)/(80-90) 141/80 (10/18 0741) SpO2:  [90 %-96 %] 90 % (10/18 0741) Last BM Date: 11/15/19  Weight change: Filed Weights   11/13/19 2016 11/14/19 0519 11/15/19 0412  Weight: 67.6 kg 67.4 kg 63.6 kg    Intake/Output:   Intake/Output Summary (Last 24 hours) at 11/16/2019 0911 Last data filed at 11/16/2019 0300 Gross per 24 hour  Intake 240 ml  Output 1200 ml  Net -960 ml    PHYSICAL EXAM: General:  Thin and fatigue appearing male. No respiratory difficulty HEENT: normal Neck: supple. JVD elevated to jaw. Carotids 2+ bilat; no bruits. No lymphadenopathy or thyromegaly appreciated. Cor: PMI nondisplaced. Regular rate & rhythm. No rubs, gallops or murmurs. Lungs: decreased BS at the bases R>L  Abdomen: soft, nontender, nondistended. No hepatosplenomegaly. No bruits or masses. Good bowel sounds. Extremities: no cyanosis, clubbing, rash, trace bilateral LE edema Neuro: alert & oriented x 3, cranial nerves grossly intact. moves all 4 extremities w/o difficulty. Affect pleasant.  Telemetry: NSR 70s Personally reviewed   Labs: Basic Metabolic Panel: Recent Labs  Lab 11/13/19 1527 11/13/19 2045 11/14/19 0307 11/15/19 0306  NA 136  --  139 137  K 3.6  --  3.7 3.5  CL 99  --  100 96*  CO2 29  --  30 31  GLUCOSE 111*  --  89 76  BUN <5*  --  <5* <5*  CREATININE 0.81 0.84 0.81 0.82  CALCIUM 8.0*  --   8.1* 8.0*  MG  --   --  1.8 2.1    Liver Function Tests: Recent Labs  Lab 11/13/19 1527  AST 31  ALT 18  ALKPHOS 115  BILITOT 0.6  PROT 6.0*  ALBUMIN 2.5*   No results for input(s): LIPASE, AMYLASE in the last 168 hours. No results for input(s): AMMONIA in the last 168 hours.  CBC: Recent Labs  Lab 11/13/19 1527 11/13/19 2045 11/14/19 0307 11/15/19 0306 11/16/19 0834  WBC 6.6 6.9 7.1 6.7 6.7  NEUTROABS 2.5  --   --   --   --   HGB 11.5* 12.5* 11.5* 11.5* 13.6  HCT 35.7* 38.2* 34.5* 34.4* 41.4  MCV 76.9* 75.9* 75.2* 75.4* 75.3*  PLT 359 379 301 287 341    Cardiac Enzymes: No results for input(s): CKTOTAL, CKMB, CKMBINDEX, TROPONINI in the last 168 hours.  BNP: BNP (last 3 results) Recent Labs    11/13/19 2045  BNP >4,500.0*    ProBNP (last 3 results) No results for input(s): PROBNP in the last 8760 hours.    Other results:  Imaging: CT CHEST ABDOMEN PELVIS W CONTRAST  Result Date: 11/14/2019 CLINICAL DATA:  Unintentional weight loss EXAM: CT CHEST, ABDOMEN, AND PELVIS WITH CONTRAST TECHNIQUE: Multidetector CT imaging of the chest, abdomen and pelvis was performed following the standard protocol during bolus administration of intravenous   contrast. CONTRAST:  100mL OMNIPAQUE IOHEXOL 300 MG/ML  SOLN COMPARISON:  Chest radiograph 11/13/2019 FINDINGS: CT CHEST FINDINGS Cardiovascular: Atherosclerotic calcification of the aortic arch. Mild to moderate cardiomegaly. Mediastinum/Nodes: Prevascular lymph node 0.9 cm in short axis on image 17/3. Right paratracheal lymph node 0.8 cm in short axis on image 18 of series 3. These lymph nodes are indistinct and may be upper normal in size due to passive congestion. Similar scattered small indistinct axillary lymph nodes. Lungs/Pleura: Small left and moderate right pleural effusion with passive atelectasis. Minimal scattered subsegmental atelectasis in the lungs. Musculoskeletal: Diffuse subcutaneous edema. Nonunited fractures  of the ninth, tenth, and eleventh ribs posterolaterally with a partially healed fracture of the left twelfth rib posterolaterally. CT ABDOMEN PELVIS FINDINGS Hepatobiliary: Prominence of the lateral segment left hepatic lobe could be a sign of early cirrhosis. No focal liver lesion is identified. Gallbladder unremarkable. Pancreas: Unremarkable Spleen: Unremarkable Adrenals/Urinary Tract: 2.5 by 2.4 cm fluid density lesion exophytic from the left kidney upper pole favoring simple cyst. The adrenal glands appear unremarkable. Stomach/Bowel: Unremarkable Vascular/Lymphatic: No pathologic adenopathy. Reproductive: Unremarkable Other: Diffuse subcutaneous and mesenteric edema. Small amount of ascites along the paracolic gutters. Mild perihepatic and perisplenic ascites. Musculoskeletal: Small left hydrocele along the spermatic cord. Disc bulges at L3-4, L4-5, and L5-S1. IMPRESSION: 1. Diffuse subcutaneous and mesenteric edema with small amount of ascites along the paracolic gutters and mild perihepatic and perisplenic ascites. The appearance is compatible with third spacing of fluid and may be related to the patient's hypoalbuminemia and hypoproteinemia. 2. Mild to moderate cardiomegaly. 3. Small left and moderate right pleural effusions with passive atelectasis. 4. Prominence of the lateral segment left hepatic lobe could be a sign of early cirrhosis. 5. Nonunited fractures of the left ninth, tenth, and eleventh ribs posterolaterally with a partially healed fracture of the left twelfth rib posterolaterally. 6. Disc bulges at L3-4, L4-5, and L5-S1. 7. Small left hydrocele along the spermatic cord. 8. Aortic atherosclerosis. Aortic Atherosclerosis (ICD10-I70.0). Electronically Signed   By: Walter  Liebkemann M.D.   On: 11/14/2019 15:07     Medications:     Scheduled Medications: . aspirin EC  81 mg Oral Daily  . enoxaparin (LOVENOX) injection  40 mg Subcutaneous Q24H  . feeding supplement  237 mL Oral TID WC   . folic acid  1 mg Oral Daily  . furosemide  60 mg Intravenous Q12H  . hydrocortisone  10 mg Oral q AM  . hydrocortisone  5 mg Oral QPM  . levothyroxine  50 mcg Oral Daily  . potassium chloride  20 mEq Oral Daily  . rosuvastatin  10 mg Oral Daily  . sacubitril-valsartan  1 tablet Oral BID  . sodium chloride flush  3 mL Intravenous Q12H  . sodium chloride flush  3 mL Intravenous Q12H    Infusions: . sodium chloride    . sodium chloride    . sodium chloride      PRN Medications: sodium chloride, sodium chloride, acetaminophen, ondansetron (ZOFRAN) IV, sodium chloride flush, sodium chloride flush   Assessment/Plan:   1. Acute on chronic systolic HF with marked volume overload - ECG markedly abnormal suggesting ischemic etiology vs infiltrative process. Markedly elevated BNP, marked 1AVB and high lateral qs suggest possible infiltrative process) - auto-immune serologies negative. 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 Further diuresis TBD based on RHC findings  - Add spiro 12.5 mg daily  - hold b-blocker for now with possible low-output and marked 1AVB  2. Adrenal Insuffiencey  - Significant Weight loss, unintentional - extensive work-up during recent admission was negative - CT C/A/P on 11/14/19 no evidence of malignancy - Serum Cortisol markedly low 0.4 - d/w endocrinology, start hydrocortisone 10 mg qam/ 5 mg pm and obtain brain MRI w/ pituitary protocol. Endocrinology will plan to f/u as outpatient   3. Eosinophilia, mild - AEC 924 currently - parasite work-up last admit was negative - doubt HES  4. Protein-calorie malnutrition, severe -  prealbumin low 8.8 - Boost supplements - dietary consult placed, recs pending   5. Hypothyroidism - mild -synthroid has been increased to 50mcg daily  6. Hypokalemia - K 3.4 - give supp KCl - start spiro 12.5 mg daily  - follow BMP   Length of Stay: 3   Brittainy Simmons PA-C 11/16/2019, 9:11 AM  Advanced Heart Failure Team Pager [phone] (M-F; 7a - 4p)  Please contact [facility] for night-coverage after hours (4p -7a ) and weekends on [email or web address]  Patient seen and examined with the above-signed Advanced Practice Provider and/or Housestaff. I personally reviewed laboratory data, imaging studies and relevant notes. I independently examined the patient and formulated the important aspects of the plan. I have edited the note to reflect any of my changes or salient points. I have personally discussed the plan with the patient and/or family.  Volume status much improved but still feels weak. Creatinine stable. Cortisol very low  General:  Weak appearing. No resp difficulty HEENT: normal Neck: supple.JVP 8 Carotids 2+ bilat; no bruits. No lymphadenopathy or thryomegaly appreciated. Cor: PMI nondisplaced. Regular rate & rhythm. No rubs, gallops or murmurs. Lungs: clear Abdomen: soft, nontender, nondistended. No hepatosplenomegaly. No bruits or masses. Good bowel sounds. Extremities: no cyanosis, clubbing, rash, 1+ edema Neuro: alert & orientedx3, cranial nerves grossly intact. moves all 4 extremities w/o difficulty. 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 Will get cMRI and cath today. I have discussed cae with Dr. Gehrge in Endocrine. Will start hydrocortisone and get brain MRI to evalaute for empty sella syndrome.   Brynlie Daza, MD  2:25 PM     

## 2019-11-16 NOTE — Progress Notes (Signed)
Occupational Therapy Evaluation Patient Details Name: Steven Frazier MRN: [id number] DOB: 1955-08-19 Today's Date: 11/16/2019    History of Present Illness Steven Frazier is a 64 y.o. with PMH of HTN admit for acute systolic heart failure. Pt has also had LE pain and weakness. CT C/A/P with evidence of anasarca.   Clinical Impression   PTA, pt living with his family and was ambulating with a cane and completing his ADL with occasional assist from family due to increased fatigue and SOB. Prior to hospitalization in August, pt was ambulating without an AD, independent with ADL and enjoyed walking in the park. Note SOB with talking this am. Mobilized OOB to chair with SpO2 dest briefly @ 85 on RA - rebounds quickly into 90s. Began education on energy conservation and activity modification. Family able to assist 24/7 after DC. At this time, do not feel pt will need OT follow up, however will continue to assess as pt progresses. Will follow acutely.    Follow Up Recommendations  Supervision/Assistance - 24 hour(initially)  ;No OT follow up    Equipment Recommendations  Tub/shower bench    Recommendations for Other Services PT consult     Precautions / Restrictions Precautions Precautions: Fall Restrictions Weight Bearing Restrictions: No      Mobility Bed Mobility Overal bed mobility: Modified Independent                Transfers Overall transfer level: Needs assistance Equipment used: 1 person hand held assist Transfers: Sit to/from Stand;Stand Pivot Transfers Sit to Stand: Min guard Stand pivot transfers: Min assist       General transfer comment: initially unsteady    Balance Overall balance assessment: Needs assistance   Sitting balance-Leahy Scale: Good       Standing balance-Leahy Scale: Poor                             ADL either performed or assessed with clinical judgement   ADL Overall ADL's : Needs assistance/impaired Eating/Feeding: NPO    Grooming: Set up;Sitting   Upper Body Bathing: Set up;Sitting   Lower Body Bathing: Min guard;Sit to/from stand   Upper Body Dressing : Set up;Sitting   Lower Body Dressing: Minimal assistance;Sit to/from stand   Toilet Transfer: Minimal assistance;Ambulation   Toileting- Clothing Manipulation and Hygiene: Supervision/safety Toileting - Clothing Manipulation Details (indicate cue type and reason): using urinal independently   Tub/Shower Transfer Details (indicate cue type and reason): Discussed bathroom set up. Would benefit form use of tub bench adn hand held shower   General ADL Comments: Eaily fatigued during ADL tasks. Able to complete figure 4 positioning. Began education regarding energy conservation     Vision Baseline Vision/History: Wears glasses Wears Glasses: At all times Additional Comments: "not perfect; my intention is to get more glasses"     Perception     Praxis      Pertinent Vitals/Pain Pain Assessment: 0-10 Pain Score: 8  Pain Location: L upper leg Pain Descriptors / Indicators: Aching;Discomfort Pain Intervention(s): Limited activity within patient's tolerance     Hand Dominance Right   Extremity/Trunk Assessment Upper Extremity Assessment Upper Extremity Assessment: Generalized weakness   Lower Extremity Assessment Lower Extremity Assessment: Defer to PT evaluation   Cervical / Trunk Assessment Cervical / Trunk Assessment: Normal   Communication Communication Communication: No difficulties   Cognition Arousal/Alertness: Awake/alert Behavior During Therapy: WFL for tasks assessed/performed Overall Cognitive Status: Within Functional Limits for tasks  assessed                                     General Comments       Exercises Exercises: Other exercises;General Upper Extremity General Exercises - Upper Extremity Shoulder Extension: AROM;10 reps;Seated Elbow Flexion: AROM;Both;10 reps Other Exercises Other  Exercises: pursedlip breathing Other Exercises: marching in chair x 10   Shoulder Instructions      Home Living Family/patient expects to be discharged to:: Private residence Living Arrangements: Spouse/significant other;Children Available Help at Discharge: Family;Available 24 hours/day Type of Home: Apartment Home Access: Stairs to enter [employer] of Steps: 3 flights Entrance Stairs-Rails: Right;Left Home Layout: One level     Bathroom Shower/Tub: IT trainer: Standard Bathroom Accessibility: Yes How Accessible: Accessible via walker Home Equipment: Walker - 2 wheels;Cane - single point          Prior Functioning/Environment          Comments: Before hospitalization in August, he was independent without AD; After going home only used RW for @ 2 weeks, then used cane and completing his own ADL; family assisted with getting out of tub; takes tub bathuntil this admission in October; Pt states that completing his bathing/dressing has been more difficult due to increased SOB adn fatigue        OT Problem List: Decreased strength;Decreased activity tolerance;Impaired balance (sitting and/or standing);Decreased safety awareness;Decreased knowledge of use of DME or AE;Cardiopulmonary status limiting activity;Pain      OT Treatment/Interventions: Self-care/ADL training;Therapeutic exercise;Energy conservation;DME and/or AE instruction;Therapeutic activities;Patient/family education;Balance training    OT Goals(Current goals can be found in the care plan section) Acute Rehab OT Goals Patient Stated Goal: to get stronger OT Goal Formulation: With patient/family Time For Goal Achievement: 11/30/19 Potential to Achieve Goals: Good  OT Frequency: Min 2X/week   Barriers to D/C:            Co-evaluation              AM-PAC OT "6 Clicks" Daily Activity     Outcome Measure Help from another person eating meals?: Total (NPO for  procedure) Help from another person taking care of personal grooming?: A Little Help from another person toileting, which includes using toliet, bedpan, or urinal?: A Little Help from another person bathing (including washing, rinsing, drying)?: A Little Help from another person to put on and taking off regular upper body clothing?: A Little Help from another person to put on and taking off regular lower body clothing?: A Little 6 Click Score: 16   End of Session Equipment Utilized During Treatment: Gait belt Nurse Communication: Mobility status  Activity Tolerance: Patient tolerated treatment well Patient left: in chair;with call bell/phone within reach;with chair alarm set;with family/visitor present  OT Visit Diagnosis: Unsteadiness on feet (R26.81);Muscle weakness (generalized) (M62.81);Pain Pain - Right/Left: Left Pain - part of body: Leg                Time: 0920-0950 OT Time Calculation (min): 30 min Charges:  OT General Charges $OT Visit: 1 Visit OT Evaluation $OT Eval Moderate Complexity: 1 Mod OT Treatments $Self Care/Home Management : 8-22 mins  Luisa Dago, OT/L   Acute OT Clinical Specialist Acute Rehabilitation Services Pager [phone] Office [phone]   [facility] 11/16/2019, 10:43 AM

## 2019-11-16 NOTE — Progress Notes (Signed)
Patient left unit in his bed to go for heart cath procedure.

## 2019-11-16 NOTE — H&P (View-Only) (Signed)
Advanced Heart Failure Rounding Note   Subjective:    CT C/A/P 10/16 with evidence of anasarca. No malignancy or lymphadenopathy.   -2.3L in UOP yesterday. Wt not charted today.   He reports breathing is not back to 100% normal but has improved since admit. OOB sitting up in chair. BP is ok. On for [facility] later today.   Plasma Cortisol level is markedly low at 0.4.   Objective:   Weight Range:  Vital Signs:   Temp:  [97.7 F (36.5 C)-98.7 F (37.1 C)] 98.1 F (36.7 C) (10/18 0741) Pulse Rate:  [74-79] 76 (10/18 0741) Resp:  [16-20] 20 (10/18 0741) BP: (119-143)/(80-90) 141/80 (10/18 0741) SpO2:  [90 %-96 %] 90 % (10/18 0741) Last BM Date: 11/15/19  Weight change: Filed Weights   11/13/19 2016 11/14/19 0519 11/15/19 0412  Weight: 67.6 kg 67.4 kg 63.6 kg    Intake/Output:   Intake/Output Summary (Last 24 hours) at 11/16/2019 0911 Last data filed at 11/16/2019 0300 Gross per 24 hour  Intake 240 ml  Output 1200 ml  Net -960 ml    PHYSICAL EXAM: General:  Thin and fatigue appearing male. No respiratory difficulty HEENT: normal Neck: supple. JVD elevated to jaw. Carotids 2+ bilat; no bruits. No lymphadenopathy or thyromegaly appreciated. Cor: PMI nondisplaced. Regular rate & rhythm. No rubs, gallops or murmurs. Lungs: decreased BS at the bases R>L  Abdomen: soft, nontender, nondistended. No hepatosplenomegaly. No bruits or masses. Good bowel sounds. Extremities: no cyanosis, clubbing, rash, trace bilateral LE edema Neuro: alert & oriented x 3, cranial nerves grossly intact. moves all 4 extremities w/o difficulty. Affect pleasant.  Telemetry: NSR 70s Personally reviewed   Labs: Basic Metabolic Panel: Recent Labs  Lab 11/13/19 1527 11/13/19 2045 11/14/19 0307 11/15/19 0306  NA 136  --  139 137  K 3.6  --  3.7 3.5  CL 99  --  100 96*  CO2 29  --  30 31  GLUCOSE 111*  --  89 76  BUN <5*  --  <5* <5*  CREATININE 0.81 0.84 0.81 0.82  CALCIUM 8.0*  --   8.1* 8.0*  MG  --   --  1.8 2.1    Liver Function Tests: Recent Labs  Lab 11/13/19 1527  AST 31  ALT 18  ALKPHOS 115  BILITOT 0.6  PROT 6.0*  ALBUMIN 2.5*   No results for input(s): LIPASE, AMYLASE in the last 168 hours. No results for input(s): AMMONIA in the last 168 hours.  CBC: Recent Labs  Lab 11/13/19 1527 11/13/19 2045 11/14/19 0307 11/15/19 0306 11/16/19 0834  WBC 6.6 6.9 7.1 6.7 6.7  NEUTROABS 2.5  --   --   --   --   HGB 11.5* 12.5* 11.5* 11.5* 13.6  HCT 35.7* 38.2* 34.5* 34.4* 41.4  MCV 76.9* 75.9* 75.2* 75.4* 75.3*  PLT 359 379 301 287 341    Cardiac Enzymes: No results for input(s): CKTOTAL, CKMB, CKMBINDEX, TROPONINI in the last 168 hours.  BNP: BNP (last 3 results) Recent Labs    11/13/19 2045  BNP >4,500.0*    ProBNP (last 3 results) No results for input(s): PROBNP in the last 8760 hours.    Other results:  Imaging: CT CHEST ABDOMEN PELVIS W CONTRAST  Result Date: 11/14/2019 CLINICAL DATA:  Unintentional weight loss EXAM: CT CHEST, ABDOMEN, AND PELVIS WITH CONTRAST TECHNIQUE: Multidetector CT imaging of the chest, abdomen and pelvis was performed following the standard protocol during bolus administration of intravenous  contrast. CONTRAST:  134m OMNIPAQUE IOHEXOL 300 MG/ML  SOLN COMPARISON:  Chest radiograph 11/13/2019 FINDINGS: CT CHEST FINDINGS Cardiovascular: Atherosclerotic calcification of the aortic arch. Mild to moderate cardiomegaly. Mediastinum/Nodes: Prevascular lymph node 0.9 cm in short axis on image 17/3. Right paratracheal lymph node 0.8 cm in short axis on image 18 of series 3. These lymph nodes are indistinct and may be upper normal in size due to passive congestion. Similar scattered small indistinct axillary lymph nodes. Lungs/Pleura: Small left and moderate right pleural effusion with passive atelectasis. Minimal scattered subsegmental atelectasis in the lungs. Musculoskeletal: Diffuse subcutaneous edema. Nonunited fractures  of the ninth, tenth, and eleventh ribs posterolaterally with a partially healed fracture of the left twelfth rib posterolaterally. CT ABDOMEN PELVIS FINDINGS Hepatobiliary: Prominence of the lateral segment left hepatic lobe could be a sign of early cirrhosis. No focal liver lesion is identified. Gallbladder unremarkable. Pancreas: Unremarkable Spleen: Unremarkable Adrenals/Urinary Tract: 2.5 by 2.4 cm fluid density lesion exophytic from the left kidney upper pole favoring simple cyst. The adrenal glands appear unremarkable. Stomach/Bowel: Unremarkable Vascular/Lymphatic: No pathologic adenopathy. Reproductive: Unremarkable Other: Diffuse subcutaneous and mesenteric edema. Small amount of ascites along the paracolic gutters. Mild perihepatic and perisplenic ascites. Musculoskeletal: Small left hydrocele along the spermatic cord. Disc bulges at L3-4, L4-5, and L5-S1. IMPRESSION: 1. Diffuse subcutaneous and mesenteric edema with small amount of ascites along the paracolic gutters and mild perihepatic and perisplenic ascites. The appearance is compatible with third spacing of fluid and may be related to the patient's hypoalbuminemia and hypoproteinemia. 2. Mild to moderate cardiomegaly. 3. Small left and moderate right pleural effusions with passive atelectasis. 4. Prominence of the lateral segment left hepatic lobe could be a sign of early cirrhosis. 5. Nonunited fractures of the left ninth, tenth, and eleventh ribs posterolaterally with a partially healed fracture of the left twelfth rib posterolaterally. 6. Disc bulges at L3-4, L4-5, and L5-S1. 7. Small left hydrocele along the spermatic cord. 8. Aortic atherosclerosis. Aortic Atherosclerosis (ICD10-I70.0). Electronically Signed   By: WVan ClinesM.D.   On: 11/14/2019 15:07     Medications:     Scheduled Medications: . aspirin EC  81 mg Oral Daily  . enoxaparin (LOVENOX) injection  40 mg Subcutaneous Q24H  . feeding supplement  237 mL Oral TID WC   . folic acid  1 mg Oral Daily  . furosemide  60 mg Intravenous Q12H  . hydrocortisone  10 mg Oral q AM  . hydrocortisone  5 mg Oral QPM  . levothyroxine  50 mcg Oral Daily  . potassium chloride  20 mEq Oral Daily  . rosuvastatin  10 mg Oral Daily  . sacubitril-valsartan  1 tablet Oral BID  . sodium chloride flush  3 mL Intravenous Q12H  . sodium chloride flush  3 mL Intravenous Q12H    Infusions: . sodium chloride    . sodium chloride    . sodium chloride      PRN Medications: sodium chloride, sodium chloride, acetaminophen, ondansetron (ZOFRAN) IV, sodium chloride flush, sodium chloride flush   Assessment/Plan:   1. Acute on chronic systolic HF with marked volume overload - ECG markedly abnormal suggesting ischemic etiology vs infiltrative process. Markedly elevated BNP, marked 1AVB and high lateral qs suggest possible infiltrative process) - auto-immune serologies negative. 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 Further diuresis TBD based on RHC findings  - Add spiro 12.5 mg daily  - hold b-blocker for now with possible low-output and marked 1AVB  2. Adrenal Insuffiencey  - Significant Weight loss, unintentional - extensive work-up during recent admission was negative - CT C/A/P on 11/14/19 no evidence of malignancy - Serum Cortisol markedly low 0.4 - d/w endocrinology, start hydrocortisone 10 mg qam/ 5 mg pm and obtain brain MRI w/ pituitary protocol. Endocrinology will plan to f/u as outpatient   3. Eosinophilia, mild - AEC 924 currently - parasite work-up last admit was negative - doubt HES  4. Protein-calorie malnutrition, severe -  prealbumin low 8.8 - Boost supplements - dietary consult placed, recs pending   5. Hypothyroidism - mild -synthroid has been increased to 17mg daily  6. Hypokalemia - K 3.4 - give supp KCl - start spiro 12.5 mg daily  - follow BMP   Length of Stay: 3   Brittainy Simmons PA-C 11/16/2019, 9:11 AM  Advanced Heart Failure Team Pager [phone](M-F; 7a - 4p)  Please contact [facility] for night-coverage after hours (4p -7a ) and weekends on [email or web address]  Patient seen and examined with the above-signed Advanced Practice Provider and/or Housestaff. I personally reviewed laboratory data, imaging studies and relevant notes. I independently examined the patient and formulated the important aspects of the plan. I have edited the note to reflect any of my changes or salient points. I have personally discussed the plan with the patient and/or family.  Volume status much improved but still feels weak. Creatinine stable. Cortisol very low  General:  Weak appearing. No resp difficulty HEENT: normal Neck: supple.JVP 8 Carotids 2+ bilat; no bruits. No lymphadenopathy or thryomegaly appreciated. Cor: PMI nondisplaced. Regular rate & rhythm. No rubs, gallops or murmurs. Lungs: clear Abdomen: soft, nontender, nondistended. No hepatosplenomegaly. No bruits or masses. Good bowel sounds. Extremities: no cyanosis, clubbing, rash, 1+ edema Neuro: alert & orientedx3, cranial nerves grossly intact. moves all 4 extremities w/o difficulty. 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 Will get cMRI and cath today. I have discussed cae with Dr. GMonna Famin Endocrine. Will start hydrocortisone and get brain MRI to evalaute for empty sella syndrome.   DGlori Bickers MD  2:25 PM

## 2019-11-16 NOTE — Plan of Care (Signed)
  Problem: Activity: Goal: Risk for activity intolerance will decrease Outcome: Progressing   Problem: Safety: Goal: Ability to remain free from injury will improve Outcome: Progressing   

## 2019-11-16 NOTE — Evaluation (Signed)
Physical Therapy Evaluation Patient Details Name: Steven Frazier MRN: [id number] DOB: 01-29-1956 Today's Date: 11/16/2019   History of Present Illness  Steven Frazier is a 64 y.o. with PMH of HTN admit for acute systolic heart failure. Pt has also had LE pain and weakness.  Clinical Impression  Pt seen with above mentioned diagnosis; pt performed transfers and ambulation S-min guard assist, pt with drop in O2 during ambulation short distance but able to recover following seating rest break (84% amb; seated <10 sec inc 96%); therapist attempted to assess stairs with daughter asking to try another time since pt has procedures today, will need to assess since pt has 3 flights of stairs to get into apt; pt demonstrating deficits in balance and overall endurance, pt will benefit from skilled PT to address deficits to maximize independence with functional mobility prior to discharge.     Follow Up Recommendations No PT follow up;Other (comment) (PT educated daughter and pt on HHPT and benefits, daughter refused stating he does very well as long as he doesn't have this extra fluid)    Equipment Recommendations       Recommendations for Other Services       Precautions / Restrictions Precautions Precautions: Fall Restrictions Weight Bearing Restrictions: No      Mobility  Bed Mobility Overal bed mobility: Modified Independent                Transfers Overall transfer level: Needs assistance Equipment used: 1 person hand held assist Transfers: Sit to/from [employer] Transfers Sit to Stand: Supervision Stand pivot transfers: Min guard       General transfer comment: initially unsteady  Ambulation/Gait Ambulation/Gait assistance: Min guard;Supervision Gait Distance (Feet): 16 Feet Assistive device: None       General Gait Details: pt requiring min guard with turns due to wires; pt S during ambulation; no complaint of any increase in pain in L LE during  ambulation  Stairs            Wheelchair Mobility    Modified Rankin (Stroke Patients Only)       Balance Overall balance assessment: Mild deficits observed, not formally tested   Sitting balance-Leahy Scale: Good       Standing balance-Leahy Scale: Poor                               Pertinent Vitals/Pain Pain Assessment: 0-10 Pain Score: 6  Pain Location: L upper leg Pain Descriptors / Indicators: Aching;Discomfort Pain Intervention(s): Limited activity within patient's tolerance    Home Living Family/patient expects to be discharged to:: Private residence Living Arrangements: Spouse/significant other;Children Available Help at Discharge: Family;Available 24 hours/day Type of Home: Apartment Home Access: Stairs to enter Entrance Stairs-Rails: Right;Left Entrance Stairs-Number of Steps: 3 flights Home Layout: One level Home Equipment: Environmental consultant - 2 wheels;Cane - single point Additional Comments: pt lives with his wife and one of their children, visitor today was daughter who lives nearby    Prior Function Level of Independence: Independent         Comments: Before hospitalization in August, he was independent without AD; After going home only used RW for @ 2 weeks, then used cane and completing his own ADL; family assisted with getting out of tub; takes tub bathuntil this admission in October; Pt states that completing his bathing/dressing has been more difficult due to increased SOB and fatigue. daugther states when he doesn't have excess fluid  he has no trouble with 3 flights of stairs     Hand Dominance   Dominant Hand: Right    Extremity/Trunk Assessment   Upper Extremity Assessment Upper Extremity Assessment: Defer to OT evaluation    Lower Extremity Assessment Lower Extremity Assessment: Overall WFL for tasks assessed    Cervical / Trunk Assessment Cervical / Trunk Assessment: Normal  Communication   Communication: No difficulties   Cognition Arousal/Alertness: Awake/alert Behavior During Therapy: WFL for tasks assessed/performed Overall Cognitive Status: Within Functional Limits for tasks assessed                                        General Comments General comments (skin integrity, edema, etc.): O2 dropped during ambulation 84%, once returned to sitting returned o 96% <10 seconds, RN notified; pt's daughter refused trial of stairs today, will need to assess in order to safely return home; increased time spent educating pt and daughter on HHPT, daughter stated pt doesn't need it and does very well as long as he doesn't have excess fluid    Exercises General Exercises - Upper Extremity Shoulder Extension: AROM;10 reps;Seated Elbow Flexion: AROM;Both;10 reps Other Exercises Other Exercises: pursedlip breathing Other Exercises: marching in chair x 10   Assessment/Plan    PT Assessment Patient needs continued PT services  PT Problem List Decreased activity tolerance       PT Treatment Interventions Therapeutic exercise;Gait training;Stair training;Patient/family education    PT Goals (Current goals can be found in the Care Plan section)  Acute Rehab PT Goals Patient Stated Goal: to go home PT Goal Formulation: With patient Time For Goal Achievement: 12/01/19 Potential to Achieve Goals: Good    Frequency Min 3X/week   Barriers to discharge        Co-evaluation               AM-PAC PT "6 Clicks" Mobility  Outcome Measure Help needed turning from your back to your side while in a flat bed without using bedrails?: None Help needed moving from lying on your back to sitting on the side of a flat bed without using bedrails?: None Help needed moving to and from a bed to a chair (including a wheelchair)?: A Little Help needed standing up from a chair using your arms (e.g., wheelchair or bedside chair)?: None Help needed to walk in hospital room?: A Little Help needed climbing 3-5  steps with a railing? : A Little 6 Click Score: 21    End of Session Equipment Utilized During Treatment: Gait belt Activity Tolerance: Patient tolerated treatment well Patient left: in chair;with family/visitor present;with call bell/phone within reach;with chair alarm set Nurse Communication: Mobility status PT Visit Diagnosis: Unsteadiness on feet (R26.81)    Time: 9244-6286 PT Time Calculation (min) (ACUTE ONLY): 16 min   Charges:   PT Evaluation $PT Eval Low Complexity: 1 Low          Ginette Otto, DPT Acute Rehabilitation Services [phone]  Lucretia Field 11/16/2019, 11:50 AM

## 2019-11-17 ENCOUNTER — Encounter (HOSPITAL_COMMUNITY): Payer: Self-pay | Admitting: Internal Medicine

## 2019-11-17 ENCOUNTER — Inpatient Hospital Stay (HOSPITAL_COMMUNITY): Payer: Medicaid Other

## 2019-11-17 DIAGNOSIS — I5023 Acute on chronic systolic (congestive) heart failure: Secondary | ICD-10-CM | POA: Diagnosis not present

## 2019-11-17 LAB — BASIC METABOLIC PANEL
Anion gap: 13 (ref 5–15)
BUN: <5 mg/dL — ABNORMAL LOW (ref 8–23)
CO2: 30 mmol/L (ref 22–32)
Calcium: 8 mg/dL — ABNORMAL LOW (ref 8.9–10.3)
Chloride: 96 mmol/L — ABNORMAL LOW (ref 98–111)
Creatinine, Ser: 0.76 mg/dL (ref 0.61–1.24)
GFR, Estimated: >60 mL/min (ref >60)
Glucose, Bld: 82 mg/dL (ref 70–99)
Potassium: 3.8 mmol/L (ref 3.5–5.1)
Sodium: 139 mmol/L (ref 135–145)

## 2019-11-17 LAB — IRON AND TIBC
Iron: 48 ug/dL (ref 45–182)
Saturation Ratios: 16 % — ABNORMAL LOW (ref 17.9–39.5)
TIBC: 304 ug/dL (ref 250–450)
UIBC: 256 ug/dL

## 2019-11-17 LAB — CBC
HCT: 37.4 % — ABNORMAL LOW (ref 39.0–52.0)
Hemoglobin: 12.4 g/dL — ABNORMAL LOW (ref 13.0–17.0)
MCH: 24.8 pg — ABNORMAL LOW (ref 26.0–34.0)
MCHC: 33.2 g/dL (ref 30.0–36.0)
MCV: 74.8 fL — ABNORMAL LOW (ref 80.0–100.0)
Platelets: 294 10*3/uL (ref 150–400)
RBC: 5 MIL/uL (ref 4.22–5.81)
RDW: 18.5 % — ABNORMAL HIGH (ref 11.5–15.5)
WBC: 5.5 10*3/uL (ref 4.0–10.5)
nRBC: 0 % (ref 0.0–0.2)

## 2019-11-17 LAB — MAGNESIUM: Magnesium: 1.9 mg/dL (ref 1.7–2.4)

## 2019-11-17 IMAGING — NM NM SCAN TUMOR LOCALIZE WITH SPECT
3 series · 18 of 18 positions shown · non-contrast
Comparison: none

CLINICAL DATA: HEART FAILURE. CONCERN FOR CARDIAC AMYLOIDOSIS.

EXAM:
NUCLEAR MEDICINE TUMOR LOCALIZATION. PYP CARDIAC AMYLOIDOSIS SCAN
WITH SPECT
TECHNIQUE: Following intravenous administration of radiopharmaceutical,
anterior planar images of the chest were obtained. Regions of
interest were placed on the heart and contralateral chest wall for
quantitative assessment. Additional SPECT imaging of the chest was
obtained.
RADIOPHARMACEUTICALS:  20.3 mCi TECHNETIUM 99 PYROPHOSPHATE

[Series 1: spect - (id)_(id)_tra · 4.1mm · 4.14mm/px · 6 of 128 frames shown]
[frame 11/128]
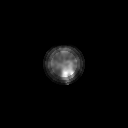
[frame 32/128]
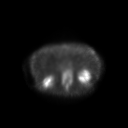
[frame 54/128]
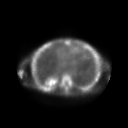
[frame 75/128]
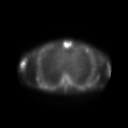
[frame 96/128]
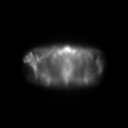
[frame 118/128]
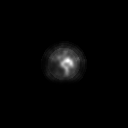

[Series 1: spect - (id)_(id)_cor · 4.1mm · 4.14mm/px · 6 of 128 frames shown]
[frame 11/128]
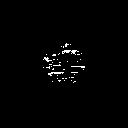
[frame 32/128]
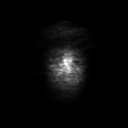
[frame 54/128]
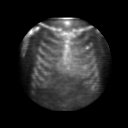
[frame 75/128]
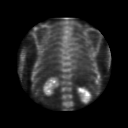
[frame 96/128]
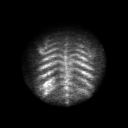
[frame 118/128]
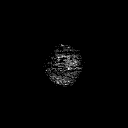

[Series 2: amyloid · 4.14mm/px · 6 of 64 frames shown]
[frame 6/64]
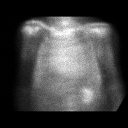
[frame 16/64]
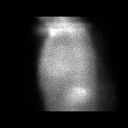
[frame 27/64]
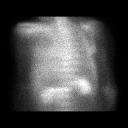
[frame 38/64]
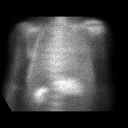
[frame 48/64]
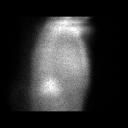
[frame 59/64]
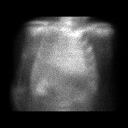

[18 of 18 positions shown; findings below may reference images not displayed]

FINDINGS: Planar Visual assessment:

Anterior planar imaging demonstrates radiotracer uptake within the
heart less than uptake within the adjacent ribs (Grade 1).

Quantitative assessment :

Quantitative assessment of the cardiac uptake compared to the
contralateral chest wall:

(H/CL = 1.18).

SPECT assessment: SPECT imaging of the chest demonstrates minimal
radiotracer accumulation within the LEFT ventricle.
IMPRESSION: Visual and quantitative assessment (grade 1 and H/CL = 1.18) are
equivocal for transthyretin amyloidosis.

## 2019-11-17 MED ORDER — INFLUENZA VAC SPLIT QUAD 0.5 ML IM SUSY
0.5000 mL | PREFILLED_SYRINGE | INTRAMUSCULAR | Status: AC
  Administered 2019-11-18: 0.5 mL via INTRAMUSCULAR
  Filled 2019-11-17: qty 0.5

## 2019-11-17 MED ORDER — LEVOTHYROXINE SODIUM 50 MCG PO TABS
50.0000 ug | ORAL_TABLET | Freq: Every day | ORAL | Status: DC
  Administered 2019-11-17 – 2019-11-20 (×4): 50 ug via ORAL
  Filled 2019-11-17 (×4): qty 1

## 2019-11-17 MED ORDER — MAGNESIUM SULFATE 2 GM/50ML IV SOLN
2.0000 g | Freq: Once | INTRAVENOUS | Status: AC
  Administered 2019-11-17: 2 g via INTRAVENOUS
  Filled 2019-11-17: qty 50

## 2019-11-17 MED ORDER — POTASSIUM CHLORIDE CRYS ER 20 MEQ PO TBCR
40.0000 meq | EXTENDED_RELEASE_TABLET | Freq: Once | ORAL | Status: AC
  Administered 2019-11-17: 40 meq via ORAL
  Filled 2019-11-17: qty 2

## 2019-11-17 MED ORDER — TECHNETIUM TC 99M PYROPHOSPHATE
20.3000 | Freq: Once | INTRAVENOUS | Status: AC | PRN
  Administered 2019-11-17: 20.3 via INTRAVENOUS
  Filled 2019-11-17: qty 21

## 2019-11-17 MED ORDER — TORSEMIDE 20 MG PO TABS
40.0000 mg | ORAL_TABLET | Freq: Every day | ORAL | Status: DC
  Administered 2019-11-17 – 2019-11-18 (×2): 40 mg via ORAL
  Filled 2019-11-17 (×2): qty 2

## 2019-11-17 NOTE — TOC Transition Note (Signed)
Transition of Care [facility]) - CM/SW Discharge Note   Patient Details  Name: Shantel Wesely MRN: [id number] Date of Birth: [birth year]  [facility] of Care [facility]) CM/SW Contact:  Leone Haven, RN Phone Number: 11/17/2019, 2:58 PM   Clinical Narrative:    Patient is from home with his wife, NCM offered choice for [facility] for CHF management, he states he does not want a HHRN.  He states he has transportation at [employer] and he has no issues with getting meds.    Final next level of care: Home/Self Care Barriers to Discharge: Continued Medical Work up   Patient Goals and CMS Choice Patient states their goals for this hospitalization and ongoing recovery are:: get better with his heart CMS Medicare.gov Compare Post Acute Care list provided to:: Patient Choice offered to / list presented to : Patient  Discharge Placement                       Discharge Plan and Services                  DME Agency: NA       HH Arranged: NA          Social Determinants of Health (SDOH) Interventions     Readmission Risk Interventions Readmission Risk Prevention Plan 09/08/2019  Post Dischage Appt Complete  Medication Screening Complete  Transportation Screening Complete

## 2019-11-17 NOTE — Progress Notes (Addendum)
Advanced Heart Failure Rounding Note   Subjective:    CT C/A/P 10/16 with evidence of anasarca. No malignancy or lymphadenopathy.  Plasma Cortisol level is markedly low at 0.4.   Cath 10/18. Normal cors. RA = 9 RV = 48/12 PA = 49/17 (31) PCW = 22 (v = 31) Fick cardiac output/index = 4.1/2.4 PVR = 2.2 WU  FA sat = 99% PA sat = 68%, 69% PAPi = 3.5  cMRI 10/18 1.  Bilateral moderate pleural effusions. 2.  Normal LV size with EF 37%, diffuse hypokinesis. 3. Moderately dilated RV with EF 34%, moderately decreased systolic function. 4. Non-coronary pattern LGE. The LGE is extensive and mid-wall, suggesting possible prior myocarditis versus infiltrative disease. Lack of LV hypertrophy and ECV % < 40% makes cardiac amyloidosis less likely.  Brain MRI  No acute  Started on hydrocortisone on 10/18 for low cortisol. Feels ok. Denies CP or SOB. No weight. I/O -2L overnight recorded     Objective:   Weight Range:  Vital Signs:   Temp:  [97.7 F (36.5 C)-98.8 F (37.1 C)] 97.9 F (36.6 C) (10/19 0400) Pulse Rate:  [0-103] 70 (10/19 0000) Resp:  [0-36] 15 (10/19 0400) BP: (130-169)/(72-97) 130/77 (10/19 0400) SpO2:  [0 %-100 %] 94 % (10/19 0400) Last BM Date: 11/15/19  Weight change: Filed Weights   11/13/19 2016 11/14/19 0519 11/15/19 0412  Weight: 67.6 kg 67.4 kg 63.6 kg    Intake/Output:   Intake/Output Summary (Last 24 hours) at 11/17/2019 0520 Last data filed at 11/17/2019 0300 Gross per 24 hour  Intake 1170 ml  Output 3225 ml  Net -2055 ml    PHYSICAL EXAM: General:  Thin and fatigue appearing male. No respiratory difficulty HEENT: normal Neck: supple. no JVP 7. Carotids 2+ bilat; no bruits. No lymphadenopathy or thryomegaly appreciated. Cor: PMI nondisplaced. Regular rate & rhythm. No rubs, gallops or murmurs. Lungs: clear Abdomen: soft, nontender, nondistended. No hepatosplenomegaly. No bruits or masses. Good bowel sounds. Extremities: no  cyanosis, clubbing, rash, 1+ edema Neuro: alert & orientedx3, cranial nerves grossly intact. moves all 4 extremities w/o difficulty. Affect pleasant  Telemetry: NSR 70sPersonally reviewed   Labs: Basic Metabolic Panel: Recent Labs  Lab 11/13/19 1527 11/13/19 1527 11/13/19 2045 11/14/19 0307 11/14/19 0307 11/15/19 0306 11/15/19 0306 11/16/19 0834 11/16/19 1632 11/16/19 1635 11/16/19 1636 11/17/19 0134  NA 136   < >  --  139   < > 137   < > 139 142 140 141 139  K 3.6   < >  --  3.7   < > 3.5   < > 3.4* 3.6 3.7 3.8 3.8  CL 99  --   --  100  --  96*  --  95*  --   --   --  96*  CO2 29  --   --  30  --  31  --  34*  --   --   --  30  GLUCOSE 111*  --   --  89  --  76  --  75  --   --   --  82  BUN <5*  --   --  <5*  --  <5*  --  <5*  --   --   --  <5*  CREATININE 0.81   < > 0.84 0.81  --  0.82  --  0.90  --   --   --  0.76  CALCIUM 8.0*   < >  --  8.1*   < > 8.0*  --  8.3*  --   --   --  8.0*  MG  --   --   --  1.8  --  2.1  --  1.7  --   --   --  1.9   < > = values in this interval not displayed.    Liver Function Tests: Recent Labs  Lab 11/13/19 1527  AST 31  ALT 18  ALKPHOS 115  BILITOT 0.6  PROT 6.0*  ALBUMIN 2.5*   No results for input(s): LIPASE, AMYLASE in the last 168 hours. No results for input(s): AMMONIA in the last 168 hours.  CBC: Recent Labs  Lab 11/13/19 1527 11/13/19 1527 11/13/19 2045 11/13/19 2045 11/14/19 0307 11/14/19 0307 11/15/19 0306 11/15/19 0306 11/16/19 0834 11/16/19 1632 11/16/19 1635 11/16/19 1636 11/17/19 0134  WBC 6.6   < > 6.9  --  7.1  --  6.7  --  6.7  --   --   --  5.5  NEUTROABS 2.5  --   --   --   --   --   --   --   --   --   --   --   --   HGB 11.5*   < > 12.5*   < > 11.5*   < > 11.5*   < > 13.6 13.3 13.6 13.6 12.4*  HCT 35.7*   < > 38.2*   < > 34.5*   < > 34.4*   < > 41.4 39.0 40.0 40.0 37.4*  MCV 76.9*   < > 75.9*  --  75.2*  --  75.4*  --  75.3*  --   --   --  74.8*  PLT 359   < > 379  --  301  --  287  --  341   --   --   --  294   < > = values in this interval not displayed.    Cardiac Enzymes: No results for input(s): CKTOTAL, CKMB, CKMBINDEX, TROPONINI in the last 168 hours.  BNP: BNP (last 3 results) Recent Labs    11/13/19 2045  BNP >4,500.0*    ProBNP (last 3 results) No results for input(s): PROBNP in the last 8760 hours.    Other results:  Imaging: MR BRAIN W WO CONTRAST  Result Date: 11/16/2019 CLINICAL DATA:  Unintentional weight loss. Concern for underlying malignancy. EXAM: MRI HEAD WITHOUT AND WITH CONTRAST TECHNIQUE: Multiplanar, multiecho pulse sequences of the brain and surrounding structures were obtained without and with intravenous contrast. CONTRAST:  17m GADAVIST GADOBUTROL 1 MMOL/ML IV SOLN COMPARISON:  None. FINDINGS: Brain: Diffusion imaging does not show any acute or subacute infarction. No abnormality affects the brainstem or cerebellum. Cerebral hemispheres show moderate chronic small-vessel ischemic changes of the deep white matter. No cortical or large vessel territory infarction. No evidence of primary or metastatic mass lesion, hemorrhage, hydrocephalus or extra-axial collection. No abnormal brain or leptomeningeal enhancement occurs. No abnormality of the hypothalamus or pituitary gland. Vascular: Major vessels at the base of the brain show flow. Skull and upper cervical spine: Considerable degenerative changes of the upper cervical spine. Some degree of spinal stenosis at C2-3, not primarily or completely evaluated. Sinuses/Orbits: Clear sinuses.  Previous lens implant on the right. Other: None IMPRESSION: 1. No acute or reversible finding. No evidence of metastatic disease. 2. Moderate chronic small-vessel ischemic changes of the cerebral hemispheric deep white matter. 3. Considerable degenerative changes of  the upper cervical spine. Some degree of spinal stenosis at C2-3, not primarily or completely evaluated. Electronically Signed   By: Nelson Chimes M.D.   On:  11/16/2019 15:19   CARDIAC CATHETERIZATION  Result Date: 11/16/2019 Findings: Ao = 123/72 (94) LV = 122/15 RA = 9 RV = 48/12 PA = 49/17 (31) PCW = 22 (v = 31) Fick cardiac output/index = 4.1/2.4 PVR = 2.2 WU FA sat = 99% PA sat = 68%, 69% PAPi = 3.5 Assessment: 1. Severe NICM EF 20% 2. Normal coronaries 3. Mildly elevated filling pressures with normal CO Plan/Discussion: Medical therapy. Await results of cMRI. Glori Bickers, MD 4:54 PM  MR CARDIAC MORPHOLOGY W WO CONTRAST  Result Date: 11/16/2019 CLINICAL DATA:  Nonischemic cardiomyopathy EXAM: CARDIAC MRI TECHNIQUE: The patient was scanned on a 1.5 Tesla GE magnet. A dedicated cardiac coil was used. Functional imaging was done using Fiesta sequences. 2,3, and 4 chamber views were done to assess for RWMA's. Modified Simpson's rule using a short axis stack was used to calculate an ejection fraction on a dedicated work Conservation officer, nature. The patient received 8 cc of Gadavist. After 10 minutes inversion recovery sequences were used to assess for infiltration and scar tissue. FINDINGS: Moderate bilateral pleural effusions with bibasilar atelectasis. Small circumferential pericardial effusion. Normal left ventricular size with normal wall thickness, EF 37% with diffuse hypokinesis. Moderately dilated right ventricle with EF 34% (moderately decreased). Severely dilated left atrium, moderately dilated right atrium. Trileaflet aortic valve without significant regurgitation or stenosis. No significant mitral regurgitation. Tricuspid regurgitation appears mild to moderate. On delayed enhancement imaging, there is very extensive mid-wall late gadolinium enhancement (LGE) diffusely throughout the left ventricle. Measurements: Extracellular fluid volume %: 35% LVEDV 157 mL LVSV 59 mL LVEF 37% RVEDV 224 mL RVSV 77 mL RVEF 34% IMPRESSION: 1.  Bilateral moderate pleural effusions. 2.  Normal LV size with EF 37%, diffuse hypokinesis. 3. Moderately dilated RV  with EF 34%, moderately decreased systolic function. 4.  Biatrial enlargement. 5. Non-coronary pattern LGE. The LGE is extensive and mid-wall, suggesting possible prior myocarditis versus infiltrative disease. Lack of LV hypertrophy and ECV % < 40% makes cardiac amyloidosis less likely. Dalton Mclean Electronically Signed   By: Loralie Champagne M.D.   On: 11/16/2019 17:47     Medications:     Scheduled Medications: . aspirin EC  81 mg Oral Daily  . enoxaparin (LOVENOX) injection  40 mg Subcutaneous Q24H  . feeding supplement  237 mL Oral BID BM  . folic acid  1 mg Oral Daily  . furosemide  60 mg Intravenous Q12H  . hydrocortisone  10 mg Oral q AM  . hydrocortisone  5 mg Oral QPM  . multivitamin with minerals  1 tablet Oral Daily  . potassium chloride  20 mEq Oral Daily  . rosuvastatin  10 mg Oral Daily  . sacubitril-valsartan  1 tablet Oral BID  . sodium chloride flush  3 mL Intravenous Q12H  . sodium chloride flush  3 mL Intravenous Q12H  . spironolactone  12.5 mg Oral QHS    Infusions: . sodium chloride    . sodium chloride      PRN Medications: sodium chloride, sodium chloride, acetaminophen, ondansetron (ZOFRAN) IV, sodium chloride flush, sodium chloride flush   Assessment/Plan:   1. Acute on chronic systolic HF with marked volume overload - ECG markedly abnormal suggesting ischemic etiology vs infiltrative process. Markedly elevated BNP, marked 1AVB and high lateral qs suggest possible infiltrative process) -  auto-immune serologies negative. 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 Brain MRI unrevealing - cMRI suggestive of previous myocarditis vs infiltrative. EF 37% - Continue Entresto 24/26 mg bid  - Urine getting dark. Will switch to torsemide 40 daily. Titrate as needed  - continue spiro  12.5 mg daily  - hold b-blocker for now with ADHF and marked 1AVB - check PYP for completeness sake   2. Adrenal Insuffiencey  - Significant Weight loss, unintentional - extensive work-up during recent admission was negative - CT C/A/P on 11/14/19 no evidence of malignancy - Serum Cortisol markedly low 0.4 - d/w endocrinology, start hydrocortisone 10 mg qam/ 5 mg pm and obtain brain MRI w/ pituitary protocol. Endocrinology will plan to f/u as outpatient   3. Eosinophilia, mild - AEC 924 currently - parasite work-up last admit was negative - doubt HES  4. Protein-calorie malnutrition, severe - prealbumin low 8.8 - Boost supplements - dietary consult placed, appreciate recs  5. Hypothyroidism - mild -synthroid has been increased to 65mg daily  6. Hypokalemia - K 3.8 - supp K and mag  - follow BMP   7. Microcytic anemia.  - check iron stores   Length of Stay: 4   DGlori BickersMD 11/17/2019, 5:20 AM  Advanced Heart Failure Team Pager [phone](M-F; 7a - 4p)  Please contact [facility] for night-coverage after hours (4p -7a ) and weekends on [email or web address]

## 2019-11-18 DIAGNOSIS — E440 Moderate protein-calorie malnutrition: Secondary | ICD-10-CM | POA: Insufficient documentation

## 2019-11-18 LAB — CBC
HCT: 38.1 % — ABNORMAL LOW (ref 39.0–52.0)
Hemoglobin: 12.7 g/dL — ABNORMAL LOW (ref 13.0–17.0)
MCH: 25 pg — ABNORMAL LOW (ref 26.0–34.0)
MCHC: 33.3 g/dL (ref 30.0–36.0)
MCV: 75 fL — ABNORMAL LOW (ref 80.0–100.0)
Platelets: 329 10*3/uL (ref 150–400)
RBC: 5.08 MIL/uL (ref 4.22–5.81)
RDW: 18.2 % — ABNORMAL HIGH (ref 11.5–15.5)
WBC: 7.1 10*3/uL (ref 4.0–10.5)
nRBC: 0 % (ref 0.0–0.2)

## 2019-11-18 LAB — FERRITIN: Ferritin: 60 ng/mL (ref 24–336)

## 2019-11-18 LAB — BASIC METABOLIC PANEL
Anion gap: 8 (ref 5–15)
BUN: <5 mg/dL — ABNORMAL LOW (ref 8–23)
CO2: 32 mmol/L (ref 22–32)
Calcium: 8.2 mg/dL — ABNORMAL LOW (ref 8.9–10.3)
Chloride: 97 mmol/L — ABNORMAL LOW (ref 98–111)
Creatinine, Ser: 0.89 mg/dL (ref 0.61–1.24)
GFR, Estimated: >60 mL/min (ref >60)
Glucose, Bld: 93 mg/dL (ref 70–99)
Potassium: 4.2 mmol/L (ref 3.5–5.1)
Sodium: 137 mmol/L (ref 135–145)

## 2019-11-18 LAB — MAGNESIUM: Magnesium: 1.9 mg/dL (ref 1.7–2.4)

## 2019-11-18 MED ORDER — SODIUM CHLORIDE 0.9 % IV SOLN
510.0000 mg | Freq: Once | INTRAVENOUS | Status: AC
  Administered 2019-11-18: 510 mg via INTRAVENOUS
  Filled 2019-11-18: qty 17

## 2019-11-18 MED ORDER — SPIRONOLACTONE 25 MG PO TABS
25.0000 mg | ORAL_TABLET | Freq: Every day | ORAL | Status: DC
  Administered 2019-11-18 – 2019-11-19 (×2): 25 mg via ORAL
  Filled 2019-11-18 (×2): qty 1

## 2019-11-18 MED ORDER — FUROSEMIDE 10 MG/ML IJ SOLN
80.0000 mg | Freq: Two times a day (BID) | INTRAMUSCULAR | Status: DC
  Administered 2019-11-18 – 2019-11-20 (×4): 80 mg via INTRAVENOUS
  Filled 2019-11-18 (×4): qty 8

## 2019-11-18 NOTE — Progress Notes (Addendum)
Advanced Heart Failure Rounding Note   Subjective:    CT C/A/P 10/16 with evidence of anasarca. No malignancy or lymphadenopathy.  Plasma Cortisol level is markedly low at 0.4.   Cath 10/18. Normal cors. RA = 9 RV = 48/12 PA = 49/17 (31) PCW = 22 (v = 31) Fick cardiac output/index = 4.1/2.4 PVR = 2.2 WU  FA sat = 99% PA sat = 68%, 69% PAPi = 3.5  cMRI 10/18 1.  Bilateral moderate pleural effusions. 2.  Normal LV size with EF 37%, diffuse hypokinesis. 3. Moderately dilated RV with EF 34%, moderately decreased systolic function. 4. Non-coronary pattern LGE. The LGE is extensive and mid-wall, suggesting possible prior myocarditis versus infiltrative disease. Lack of LV hypertrophy and ECV % < 40% makes cardiac amyloidosis less likely.  Brain MRI  No acute  Started on hydrocortisone on 10/18 for low cortisol.   Diuresed 30 lb. Transitioned to PO diuretics yesterday. Wt and SCr stable. Breathing improved.  PYP scan visual and quantitative assessment (grade 1 and H/CL = 1.18) are equivocal for transthyretin amyloidosis.  Objective:   Weight Range:  Vital Signs:   Temp:  [97 F (36.1 C)-98.2 F (36.8 C)] 97 F (36.1 C) (10/20 0922) Pulse Rate:  [75-80] 76 (10/20 0825) Resp:  [16-19] 19 (10/20 0825) BP: (128-135)/(83-102) 135/102 (10/20 0825) SpO2:  [96 %-99 %] 99 % (10/20 0825) Weight:  [57.1 kg] 57.1 kg (10/20 0355) Last BM Date: 11/17/19  Weight change: Filed Weights   11/15/19 0412 11/17/19 0736 11/18/19 0355  Weight: 63.6 kg 56.3 kg 57.1 kg    Intake/Output:   Intake/Output Summary (Last 24 hours) at 11/18/2019 0946 Last data filed at 11/18/2019 0700 Gross per 24 hour  Intake 240 ml  Output 200 ml  Net 40 ml    PHYSICAL EXAM: General:  Thin and fatigue appearing male, laying in bed. No respiratory difficulty HEENT: normal Neck: supple. no JVP to jaw. Carotids 2+ bilat; no bruits. No lymphadenopathy or thryomegaly appreciated. Cor: PMI  nondisplaced. Regular rate & rhythm. No rubs, gallops or murmurs. Lungs: clear Abdomen: soft, nontender, nondistended. No hepatosplenomegaly. No bruits or masses. Good bowel sounds. Extremities: no cyanosis, clubbing, rash,  Thin extremities, no edema  Neuro: alert & orientedx3, cranial nerves grossly intact. moves all 4 extremities w/o difficulty. Affect pleasant  Telemetry: NSR 70s, 1st degree AVB Personally reviewed   Labs: Basic Metabolic Panel: Recent Labs  Lab 11/14/19 0307 11/14/19 0307 11/15/19 0306 11/15/19 0306 11/16/19 0834 11/16/19 0834 11/16/19 1632 11/16/19 1635 11/16/19 1636 11/17/19 0134 11/18/19 0425  NA 139   < > 137   < > 139   < > 142 140 141 139 137  K 3.7   < > 3.5   < > 3.4*   < > 3.6 3.7 3.8 3.8 4.2  CL 100  --  96*  --  95*  --   --   --   --  96* 97*  CO2 30  --  31  --  34*  --   --   --   --  30 32  GLUCOSE 89  --  76  --  75  --   --   --   --  82 93  BUN <5*  --  <5*  --  <5*  --   --   --   --  <5* <5*  CREATININE 0.81  --  0.82  --  0.90  --   --   --   --  0.76 0.89  CALCIUM 8.1*   < > 8.0*   < > 8.3*  --   --   --   --  8.0* 8.2*  MG 1.8  --  2.1  --  1.7  --   --   --   --  1.9 1.9   < > = values in this interval not displayed.    Liver Function Tests: Recent Labs  Lab 11/13/19 1527  AST 31  ALT 18  ALKPHOS 115  BILITOT 0.6  PROT 6.0*  ALBUMIN 2.5*   No results for input(s): LIPASE, AMYLASE in the last 168 hours. No results for input(s): AMMONIA in the last 168 hours.  CBC: Recent Labs  Lab 11/13/19 1527 11/13/19 2045 11/14/19 0307 11/14/19 0307 11/15/19 0306 11/15/19 0306 11/16/19 0834 11/16/19 0834 11/16/19 1632 11/16/19 1635 11/16/19 1636 11/17/19 0134 11/18/19 0425  WBC 6.6   < > 7.1  --  6.7  --  6.7  --   --   --   --  5.5 7.1  NEUTROABS 2.5  --   --   --   --   --   --   --   --   --   --   --   --   HGB 11.5*   < > 11.5*   < > 11.5*   < > 13.6   < > 13.3 13.6 13.6 12.4* 12.7*  HCT 35.7*   < > 34.5*   < >  34.4*   < > 41.4   < > 39.0 40.0 40.0 37.4* 38.1*  MCV 76.9*   < > 75.2*  --  75.4*  --  75.3*  --   --   --   --  74.8* 75.0*  PLT 359   < > 301  --  287  --  341  --   --   --   --  294 329   < > = values in this interval not displayed.    Cardiac Enzymes: No results for input(s): CKTOTAL, CKMB, CKMBINDEX, TROPONINI in the last 168 hours.  BNP: BNP (last 3 results) Recent Labs    11/13/19 2045  BNP >4,500.0*    ProBNP (last 3 results) No results for input(s): PROBNP in the last 8760 hours.    Other results:  Imaging: MR BRAIN W WO CONTRAST  Result Date: 11/16/2019 CLINICAL DATA:  Unintentional weight loss. Concern for underlying malignancy. EXAM: MRI HEAD WITHOUT AND WITH CONTRAST TECHNIQUE: Multiplanar, multiecho pulse sequences of the brain and surrounding structures were obtained without and with intravenous contrast. CONTRAST:  46m GADAVIST GADOBUTROL 1 MMOL/ML IV SOLN COMPARISON:  None. FINDINGS: Brain: Diffusion imaging does not show any acute or subacute infarction. No abnormality affects the brainstem or cerebellum. Cerebral hemispheres show moderate chronic small-vessel ischemic changes of the deep white matter. No cortical or large vessel territory infarction. No evidence of primary or metastatic mass lesion, hemorrhage, hydrocephalus or extra-axial collection. No abnormal brain or leptomeningeal enhancement occurs. No abnormality of the hypothalamus or pituitary gland. Vascular: Major vessels at the base of the brain show flow. Skull and upper cervical spine: Considerable degenerative changes of the upper cervical spine. Some degree of spinal stenosis at C2-3, not primarily or completely evaluated. Sinuses/Orbits: Clear sinuses.  Previous lens implant on the right. Other: None IMPRESSION: 1. No acute or reversible finding. No evidence of metastatic disease. 2. Moderate chronic small-vessel ischemic changes of the cerebral hemispheric deep white matter. 3.  Considerable  degenerative changes of the upper cervical spine. Some degree of spinal stenosis at C2-3, not primarily or completely evaluated. Electronically Signed   By: Nelson Chimes M.D.   On: 11/16/2019 15:19   NM CARDIAC AMYLOID TUMOR LOC INFLAM SPECT 1 DAY  Result Date: 11/17/2019 CLINICAL DATA:  HEART FAILURE. CONCERN FOR CARDIAC AMYLOIDOSIS. EXAM: NUCLEAR MEDICINE TUMOR LOCALIZATION. PYP CARDIAC AMYLOIDOSIS SCAN WITH SPECT TECHNIQUE: Following intravenous administration of radiopharmaceutical, anterior planar images of the chest were obtained. Regions of interest were placed on the heart and contralateral chest wall for quantitative assessment. Additional SPECT imaging of the chest was obtained. RADIOPHARMACEUTICALS:  20.3 mCi TECHNETIUM 99 PYROPHOSPHATE FINDINGS: Planar Visual assessment: Anterior planar imaging demonstrates radiotracer uptake within the heart less than uptake within the adjacent ribs (Grade 1). Quantitative assessment : Quantitative assessment of the cardiac uptake compared to the contralateral chest wall: (H/CL = 1.18). SPECT assessment: SPECT imaging of the chest demonstrates minimal radiotracer accumulation within the LEFT ventricle. IMPRESSION: Visual and quantitative assessment (grade 1 and H/CL = 1.18) are equivocal for transthyretin amyloidosis. Electronically Signed   By: Lavonia Dana M.D.   On: 11/17/2019 13:21   CARDIAC CATHETERIZATION  Result Date: 11/16/2019 Findings: Ao = 123/72 (94) LV = 122/15 RA = 9 RV = 48/12 PA = 49/17 (31) PCW = 22 (v = 31) Fick cardiac output/index = 4.1/2.4 PVR = 2.2 WU FA sat = 99% PA sat = 68%, 69% PAPi = 3.5 Assessment: 1. Severe NICM EF 20% 2. Normal coronaries 3. Mildly elevated filling pressures with normal CO Plan/Discussion: Medical therapy. Await results of cMRI. Glori Bickers, MD 4:54 PM  MR CARDIAC MORPHOLOGY W WO CONTRAST  Result Date: 11/16/2019 CLINICAL DATA:  Nonischemic cardiomyopathy EXAM: CARDIAC MRI TECHNIQUE: The patient was  scanned on a 1.5 Tesla GE magnet. A dedicated cardiac coil was used. Functional imaging was done using Fiesta sequences. 2,3, and 4 chamber views were done to assess for RWMA's. Modified Simpson's rule using a short axis stack was used to calculate an ejection fraction on a dedicated work Conservation officer, nature. The patient received 8 cc of Gadavist. After 10 minutes inversion recovery sequences were used to assess for infiltration and scar tissue. FINDINGS: Moderate bilateral pleural effusions with bibasilar atelectasis. Small circumferential pericardial effusion. Normal left ventricular size with normal wall thickness, EF 37% with diffuse hypokinesis. Moderately dilated right ventricle with EF 34% (moderately decreased). Severely dilated left atrium, moderately dilated right atrium. Trileaflet aortic valve without significant regurgitation or stenosis. No significant mitral regurgitation. Tricuspid regurgitation appears mild to moderate. On delayed enhancement imaging, there is very extensive mid-wall late gadolinium enhancement (LGE) diffusely throughout the left ventricle. Measurements: Extracellular fluid volume %: 35% LVEDV 157 mL LVSV 59 mL LVEF 37% RVEDV 224 mL RVSV 77 mL RVEF 34% IMPRESSION: 1.  Bilateral moderate pleural effusions. 2.  Normal LV size with EF 37%, diffuse hypokinesis. 3. Moderately dilated RV with EF 34%, moderately decreased systolic function. 4.  Biatrial enlargement. 5. Non-coronary pattern LGE. The LGE is extensive and mid-wall, suggesting possible prior myocarditis versus infiltrative disease. Lack of LV hypertrophy and ECV % < 40% makes cardiac amyloidosis less likely. Makahla Kiser Electronically Signed   By: Loralie Champagne M.D.   On: 11/16/2019 17:47     Medications:     Scheduled Medications: . aspirin EC  81 mg Oral Daily  . enoxaparin (LOVENOX) injection  40 mg Subcutaneous Q24H  . feeding supplement  237 mL Oral BID BM  .  folic acid  1 mg Oral Daily  .  hydrocortisone  10 mg Oral q AM  . hydrocortisone  5 mg Oral QPM  . levothyroxine  50 mcg Oral Daily  . multivitamin with minerals  1 tablet Oral Daily  . potassium chloride  20 mEq Oral Daily  . rosuvastatin  10 mg Oral Daily  . sacubitril-valsartan  1 tablet Oral BID  . sodium chloride flush  3 mL Intravenous Q12H  . sodium chloride flush  3 mL Intravenous Q12H  . spironolactone  12.5 mg Oral QHS  . torsemide  40 mg Oral Daily    Infusions: . sodium chloride    . sodium chloride      PRN Medications: sodium chloride, sodium chloride, acetaminophen, ondansetron (ZOFRAN) IV, sodium chloride flush, sodium chloride flush   Assessment/Plan:   1. Acute on chronic systolic HF with marked volume overload - ECG markedly abnormal suggesting ischemic etiology vs infiltrative process. Markedly elevated BNP, marked 1AVB and high lateral qs suggest possible infiltrative process) - auto-immune serologies negative. 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 Brain MRI unrevealing - cMRI suggestive of previous myocarditis vs infiltrative. EF 37% - PYP visual and quantitative assessment (grade 1 and H/CL = 1.18) are equivocal for transthyretin amyloidosis => probably not TTR amyloidosis. - diuresed 30 lb - now on PO torsemide 40 mg daily, appears mildly fluid overloaded on exam. Check ReDs Clip - Continue Entresto 24/26 mg bid  - Increase spiro 25 mg daily  - hold b-blocker for now with ADHF and marked 1AVB   2. Adrenal Insuffiencey  - Significant Weight loss, unintentional - extensive work-up during recent admission was negative - CT C/A/P on 11/14/19 no evidence of malignancy - Serum Cortisol markedly low 0.4 - d/w endocrinology, start hydrocortisone 10 mg qam/ 5 mg pm, brain MRI w/ pituitary protocol  unremarkable. Endocrinology will plan to f/u as outpatient   3. Eosinophilia, mild - AEC 924 currently - parasite work-up last admit was negative - doubt HES  4. Protein-calorie malnutrition, severe - prealbumin low 8.8 - Boost supplements - dietary consult placed, appreciate recs  5. Hypothyroidism - mild -synthroid has been increased to 50mg daily  6. Hypokalemia - resolved, K 4.2 today  - supp K and mag  - follow BMP   7. Microcytic anemia.  - hgb 12 - sats 16% - check ferritin. If < 100, can give dose of feraheme    Length of Stay: 5   Brittainy Simmons PA-C 11/18/2019, 9:46 AM  Advanced Heart Failure Team Pager [phone](M-F; 7a - 4p)  [facility] for night[facility]age after hours (4p -7a ) and weekends on [email or web address]  Patient seen with PA agree with the above note.   Mild residual dyspnea.  REDS clip 41% today.  He is now on hydrocortisone for adrenal insufficiency.   General: NAD Neck: JVP 10-12 cm, no thyromegaly or thyroid nodule.  Lungs: Clear to auscultation bilaterally with normal respiratory effort. CV: Nondisplaced PMI.  Heart regular S1/S2, no S3/S4, 2/6 SEM RUSB.  No peripheral edema.   Abdomen: Soft, nontender, no hepatosplenomegaly, no distention.  Skin: Intact without lesions or rashes.  Neurologic: Alert and oriented x 3.  Psych: Normal affect. Extremities: No clubbing or cyanosis.  HEENT: Normal.   Based on MRI, cardiomyopathy looks like viral myocarditis versus  infiltrative disease.  Doubt TTR amyloidosis (PYP scan probably negative).  I think that he has ongoing volume overload.   - Resume Lasix 80 mg IV bid today. Reassess tomorrow.  - Continue Entresto and spironolactone.   Continue hydrocortisone for adrenal insufficiency.  Pituitary protocol MRI unremarkable.   Loralie Champagne 11/18/2019 3:05 PM

## 2019-11-18 NOTE — Progress Notes (Signed)
  Mobility Specialist Criteria Algorithm Info.  Mobility Team: [facility] elevated:Self regulated Activity: Ambulated in hall;Transferred:  Bed to chair (to chair after ambulation) Range of motion: Active;All extremities Level of assistance: Standby assist, set-up cues, supervision of patient - no hands on Assistive device: Front wheel walker Minutes sitting in chair:  Minutes stood: 5 minutes Minutes ambulated: 5 minutes Distance ambulated (ft): 360 ft Mobility response: Tolerated well Bed Position: Chair ([place] chair)  Pt eager and willing to participate in mobility this morning. Pt ambulated Mod I with RW in hallway for 365ft requiring frequent cues for hand placement and to stay close to walker. Tolerated well without any complaints. Pt is now sitting in recliner chair with all needs met and call bell within reach.  11/18/2019 2:12 PM

## 2019-11-18 NOTE — Progress Notes (Signed)
REDS  Clip  READING=   41% CHEST RULER= 28  Device FITTING TASKS: Clip Station = A   Leota Sauers Pharm.D. CPP, BCPS Clinical Pharmacist [phone] 11/18/2019 10:32 AM  '

## 2019-11-19 LAB — CBC
HCT: 36.8 % — ABNORMAL LOW (ref 39.0–52.0)
Hemoglobin: 12.3 g/dL — ABNORMAL LOW (ref 13.0–17.0)
MCH: 25.3 pg — ABNORMAL LOW (ref 26.0–34.0)
MCHC: 33.4 g/dL (ref 30.0–36.0)
MCV: 75.7 fL — ABNORMAL LOW (ref 80.0–100.0)
Platelets: 317 10*3/uL (ref 150–400)
RBC: 4.86 MIL/uL (ref 4.22–5.81)
RDW: 18.3 % — ABNORMAL HIGH (ref 11.5–15.5)
WBC: 9.2 10*3/uL (ref 4.0–10.5)
nRBC: 0 % (ref 0.0–0.2)

## 2019-11-19 LAB — BASIC METABOLIC PANEL
Anion gap: 8 (ref 5–15)
BUN: 5 mg/dL — ABNORMAL LOW (ref 8–23)
CO2: 32 mmol/L (ref 22–32)
Calcium: 8.3 mg/dL — ABNORMAL LOW (ref 8.9–10.3)
Chloride: 97 mmol/L — ABNORMAL LOW (ref 98–111)
Creatinine, Ser: 0.83 mg/dL (ref 0.61–1.24)
GFR, Estimated: >60 mL/min (ref >60)
Glucose, Bld: 90 mg/dL (ref 70–99)
Potassium: 3.7 mmol/L (ref 3.5–5.1)
Sodium: 137 mmol/L (ref 135–145)

## 2019-11-19 LAB — MAGNESIUM: Magnesium: 1.8 mg/dL (ref 1.7–2.4)

## 2019-11-19 MED ORDER — ISOSORB DINITRATE-HYDRALAZINE 20-37.5 MG PO TABS
0.5000 | ORAL_TABLET | Freq: Three times a day (TID) | ORAL | Status: DC
  Administered 2019-11-19 – 2019-11-20 (×3): 0.5 via ORAL
  Filled 2019-11-19 (×4): qty 1

## 2019-11-19 NOTE — Progress Notes (Addendum)
Advanced Heart Failure Rounding Note   Subjective:    CT C/A/P 10/16 with evidence of anasarca. No malignancy or lymphadenopathy.  Plasma Cortisol level is markedly low at 0.4. Pituitary protocol MRI unremarkable. Started on hydrocortisone on 10/18 for low cortisol.   IV Lasix restarted yesterday for fluid overload. ReDs clip 41%. Wt down another 3 lb. SCr/K stable.  He feels a little bit better today.   BP moderately elevated.     Cardiac Studies  Cath 10/18. Normal cors. RA = 9 RV = 48/12 PA = 49/17 (31) PCW = 22 (v = 31) Fick cardiac output/index = 4.1/2.4 PVR = 2.2 WU  FA sat = 99% PA sat = 68%, 69% PAPi = 3.5  cMRI 10/18 1.  Bilateral moderate pleural effusions. 2.  Normal LV size with EF 37%, diffuse hypokinesis. 3. Moderately dilated RV with EF 34%, moderately decreased systolic function. 4. Non-coronary pattern LGE. The LGE is extensive and mid-wall, suggesting possible prior myocarditis versus infiltrative disease. Lack of LV hypertrophy and ECV % < 40% makes cardiac amyloidosis less likely.  PYP Scan 10/19 IMPRESSION: Visual and quantitative assessment (grade 1 and H/CL = 1.18) are equivocal for transthyretin amyloidosis.   Objective:   Weight Range:  Vital Signs:   Temp:  [97 F (36.1 C)-98.3 F (36.8 C)] 97.5 F (36.4 C) (10/21 0811) Pulse Rate:  [87] 87 (10/20 2210) Resp:  [20] 20 (10/20 2210) BP: (107-111)/(62-70) 107/62 (10/20 2359) SpO2:  [97 %] 97 % (10/20 2210) Weight:  [55.4 kg] 55.4 kg (10/21 0500) Last BM Date: 11/17/19  Weight change: Filed Weights   11/17/19 0736 11/18/19 0355 11/19/19 0500  Weight: 56.3 kg 57.1 kg 55.4 kg    Intake/Output:   Intake/Output Summary (Last 24 hours) at 11/19/2019 0841 Last data filed at 11/19/2019 0500 Gross per 24 hour  Intake 820 ml  Output 1925 ml  Net -1105 ml    PHYSICAL EXAM: General:  Thin elderly male, No respiratory difficulty HEENT: normal Neck: supple. no JVP to jaw.  Carotids 2+ bilat; no bruits. No lymphadenopathy or thryomegaly appreciated. Cor: PMI nondisplaced. Regular rate & rhythm. No rubs, gallops or murmurs. Lungs: decreased BS at the bases  Abdomen: soft, nontender, nondistended. No hepatosplenomegaly. No bruits or masses. Good bowel sounds. Extremities: no cyanosis, clubbing, rash,  Thin extremities, no edema  Neuro: alert & orientedx3, cranial nerves grossly intact. moves all 4 extremities w/o difficulty. Affect pleasant  Telemetry: NSR 70s, marked 1st degree AVB Personally reviewed   Labs: Basic Metabolic Panel: Recent Labs  Lab 11/15/19 0306 11/15/19 0306 11/16/19 0834 11/16/19 0834 11/16/19 1632 11/16/19 1635 11/16/19 1636 11/17/19 0134 11/18/19 0425 11/19/19 0300  NA 137   < > 139  --    < > 140 141 139 137 137  K 3.5   < > 3.4*  --    < > 3.7 3.8 3.8 4.2 3.7  CL 96*  --  95*  --   --   --   --  96* 97* 97*  CO2 31  --  34*  --   --   --   --  30 32 32  GLUCOSE 76  --  75  --   --   --   --  82 93 90  BUN <5*  --  <5*  --   --   --   --  <5* <5* 5*  CREATININE 0.82  --  0.90  --   --   --   --  0.76 0.89 0.83  CALCIUM 8.0*   < > 8.3*   < >  --   --   --  8.0* 8.2* 8.3*  MG 2.1  --  1.7  --   --   --   --  1.9 1.9 1.8   < > = values in this interval not displayed.    Liver Function Tests: Recent Labs  Lab 11/13/19 1527  AST 31  ALT 18  ALKPHOS 115  BILITOT 0.6  PROT 6.0*  ALBUMIN 2.5*   No results for input(s): LIPASE, AMYLASE in the last 168 hours. No results for input(s): AMMONIA in the last 168 hours.  CBC: Recent Labs  Lab 11/13/19 1527 11/13/19 2045 11/15/19 0306 11/15/19 0306 11/16/19 0834 11/16/19 1632 11/16/19 1635 11/16/19 1636 11/17/19 0134 11/18/19 0425 11/19/19 0300  WBC 6.6   < > 6.7  --  6.7  --   --   --  5.5 7.1 9.2  NEUTROABS 2.5  --   --   --   --   --   --   --   --   --   --   HGB 11.5*   < > 11.5*   < > 13.6   < > 13.6 13.6 12.4* 12.7* 12.3*  HCT 35.7*   < > 34.4*   < > 41.4   <  > 40.0 40.0 37.4* 38.1* 36.8*  MCV 76.9*   < > 75.4*  --  75.3*  --   --   --  74.8* 75.0* 75.7*  PLT 359   < > 287  --  341  --   --   --  294 329 317   < > = values in this interval not displayed.    Cardiac Enzymes: No results for input(s): CKTOTAL, CKMB, CKMBINDEX, TROPONINI in the last 168 hours.  BNP: BNP (last 3 results) Recent Labs    11/13/19 2045  BNP >4,500.0*    ProBNP (last 3 results) No results for input(s): PROBNP in the last 8760 hours.    Other results:  Imaging: NM CARDIAC AMYLOID TUMOR LOC INFLAM SPECT 1 DAY  Result Date: 11/17/2019 CLINICAL DATA:  HEART FAILURE. CONCERN FOR CARDIAC AMYLOIDOSIS. EXAM: NUCLEAR MEDICINE TUMOR LOCALIZATION. PYP CARDIAC AMYLOIDOSIS SCAN WITH SPECT TECHNIQUE: Following intravenous administration of radiopharmaceutical, anterior planar images of the chest were obtained. Regions of interest were placed on the heart and contralateral chest wall for quantitative assessment. Additional SPECT imaging of the chest was obtained. RADIOPHARMACEUTICALS:  20.3 mCi TECHNETIUM 99 PYROPHOSPHATE FINDINGS: Planar Visual assessment: Anterior planar imaging demonstrates radiotracer uptake within the heart less than uptake within the adjacent ribs (Grade 1). Quantitative assessment : Quantitative assessment of the cardiac uptake compared to the contralateral chest wall: (H/CL = 1.18). SPECT assessment: SPECT imaging of the chest demonstrates minimal radiotracer accumulation within the LEFT ventricle. IMPRESSION: Visual and quantitative assessment (grade 1 and H/CL = 1.18) are equivocal for transthyretin amyloidosis. Electronically Signed   By: Lavonia Dana M.D.   On: 11/17/2019 13:21     Medications:     Scheduled Medications: . aspirin EC  81 mg Oral Daily  . enoxaparin (LOVENOX) injection  40 mg Subcutaneous Q24H  . feeding supplement  237 mL Oral BID BM  . folic acid  1 mg Oral Daily  . furosemide  80 mg Intravenous BID  . hydrocortisone  10 mg  Oral q AM  . hydrocortisone  5 mg Oral QPM  . levothyroxine  50 mcg Oral Daily  . multivitamin with minerals  1 tablet Oral Daily  . potassium chloride  20 mEq Oral Daily  . rosuvastatin  10 mg Oral Daily  . sacubitril-valsartan  1 tablet Oral BID  . sodium chloride flush  3 mL Intravenous Q12H  . sodium chloride flush  3 mL Intravenous Q12H  . spironolactone  25 mg Oral QHS    Infusions: . sodium chloride    . sodium chloride      PRN Medications: sodium chloride, sodium chloride, acetaminophen, ondansetron (ZOFRAN) IV, sodium chloride flush, sodium chloride flush   Assessment/Plan:   1. Acute on chronic systolic HF with marked volume overload - ECG markedly abnormal suggesting ischemic etiology vs infiltrative process. Markedly elevated BNP, marked 1AVB and high lateral qs suggest possible infiltrative process) - auto-immune serologies negative. 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 Brain MRI unrevealing - cMRI suggestive of previous myocarditis vs infiltrative. EF 37% - PYP visual and quantitative assessment (grade 1 and H/CL = 1.18) are equivocal for transthyretin amyloidosis => probably not TTR amyloidosis. - diuresed 33 lb but remains fluid overloaded, ReDS clip 41% yesterday.  - give another dose of 80 IV Lasix today, likely transition back to PO torsemide tomorrow  - Continue Entresto 49-51 mg bid  - Continue spiro 25 mg daily  - hold b-blocker for now with ADHF and marked 1AVB - add low dose Bidil 1/2 tablet tid  - plan SGLT2i soon (can add as outpatient)    2. Adrenal Insuffiencey  - Significant Weight loss, unintentional - extensive work-up during recent admission was negative - CT C/A/P on 11/14/19 no evidence of malignancy - Serum Cortisol markedly low 0.4 - d/w endocrinology,  start hydrocortisone 10 mg qam/ 5 mg pm, brain MRI w/ pituitary protocol unremarkable. Endocrinology will plan to f/u as outpatient   3. Eosinophilia, mild - AEC 924 currently - parasite work-up last admit was negative - doubt HES  4. Protein-calorie malnutrition, severe - prealbumin low 8.8 - Boost supplements - dietary consult placed, appreciate recs  5. Hypothyroidism - mild -synthroid has been increased to 50 mcg daily  6. Hypokalemia - resolved 3.7 today    7. Microcytic anemia.  - hgb 12 - sats 16%. Ferritin 60  - getting dose of feraheme   Length of Stay: 6   Brittainy Simmons PA-C 11/19/2019, 8:41 AM  Advanced Heart Failure Team Pager [phone] (M-F; [place])  Please contact [facility] for night-coverage after hours (4p -7a ) and weekends on [email or web address]  Patient seen with PA, agree with the above note.   Mild residual dyspnea.  Remains on IV lasix, creatinine stable.  He is now on hydrocortisone for adrenal insufficiency.   General: NAD Neck: JVP 10-12 cm, no thyromegaly or thyroid nodule.  Lungs: Clear to auscultation bilaterally with normal respiratory effort. CV: Nondisplaced PMI.  Heart regular S1/S2, no S3/S4, 2/6 SEM RUSB.  No peripheral edema.   Abdomen: Soft, nontender, no hepatosplenomegaly, no distention.  Skin: Intact without lesions or rashes.  Neurologic: Alert and oriented x 3.  Psych: Normal affect. Extremities: No clubbing or cyanosis.  HEENT: Normal.   Based on MRI, cardiomyopathy looks like viral myocarditis versus infiltrative disease.  Doubt TTR amyloidosis (PYP scan probably negative).  I think that he has ongoing volume overload though diuresing  well and weight down 3 lbs yesterday.   - Would continue Lasix 80 mg IV bid today, to po tomorrow.   - Continue Entresto and spironolactone.  - Agree with adding Bidil 1 tab tid.   Continue hydrocortisone for adrenal insufficiency.  Pituitary protocol MRI unremarkable.   Loralie Champagne 11/19/2019 9:47 AM

## 2019-11-19 NOTE — Progress Notes (Signed)
Nutrition Follow-up  DOCUMENTATION CODES:   Non-severe (moderate) malnutrition in context of chronic illness  INTERVENTION:   -Continue Ensure Enlive po BID, each supplement provides 350 kcal and 20 grams of protein -Continue MVI with minerals daily -Vegetarian diet per pt and family request  NUTRITION DIAGNOSIS:   Moderate Malnutrition related to chronic illness (CHF) as evidenced by mild muscle depletion, moderate muscle depletion, mild fat depletion, moderate fat depletion.  Ongoing  GOAL:   Patient will meet greater than or equal to 90% of their needs  Progressing   MONITOR:   PO intake, Supplement acceptance, Labs, Weight trends, Skin, I & O's  REASON FOR ASSESSMENT:   Consult Assessment of nutrition requirement/status  ASSESSMENT:   Steven Frazier is a 64 y.o. male with history of HF with borderline EF (40-45%), HTN and hypothroidism who presented to clinic with worsening shortness of breath, LE edema, abdominal distension and orthopnea found to be grossly volume overloaded with NYHA class III symptoms now admitted for acute on chronic HF exacerbation.  10/18- s/p rt and lt heart cath and coronary angiography  Reviewed I/O's: -1.1 L x 24 hours and -12 L since admission  UOP: 1.9 L x 24 hours  Attempted to speak with pt via call to hospital room phone, however, unable to reach.   Appetite has improved; noted meal completion 50-100%. Pt follows a vegetarian diet. He is consuming his Ensure Enlive supplements.   Noted significant wt loss since admission; suspect this is related to diuresis (pt -12 L since admission).   Medications reviewed and include folvite, lasix, and KCl.   Labs reviewed.   Diet Order:   Diet Order            Diet Heart Room service appropriate? Yes; Fluid consistency: Thin  Diet effective now                 EDUCATION NEEDS:   Education needs have been addressed  Skin:  Skin Assessment: Reviewed RN Assessment  Last BM:   11/19/19  Height:   Ht Readings from Last 1 Encounters:  11/13/19 5\' 5"  (1.651 m)    Weight:   Wt Readings from Last 1 Encounters:  11/19/19 55.4 kg    Ideal Body Weight:  61.8 kg  BMI:  Body mass index is 20.32 kg/m.  Estimated Nutritional Needs:   Kcal:  1850-2050  Protein:  105-120 grams  Fluid:  2 L    11/21/19, RD, LDN, CDCES Registered Dietitian II Certified Diabetes Care and Education Specialist Please refer to [facility] for RD and/or RD on-call/weekend/after hours pager

## 2019-11-20 ENCOUNTER — Other Ambulatory Visit (HOSPITAL_COMMUNITY): Payer: Self-pay | Admitting: Adult Health

## 2019-11-20 LAB — CBC
HCT: 37.2 % — ABNORMAL LOW (ref 39.0–52.0)
Hemoglobin: 12.6 g/dL — ABNORMAL LOW (ref 13.0–17.0)
MCH: 25.5 pg — ABNORMAL LOW (ref 26.0–34.0)
MCHC: 33.9 g/dL (ref 30.0–36.0)
MCV: 75.2 fL — ABNORMAL LOW (ref 80.0–100.0)
Platelets: 318 10*3/uL (ref 150–400)
RBC: 4.95 MIL/uL (ref 4.22–5.81)
RDW: 17.6 % — ABNORMAL HIGH (ref 11.5–15.5)
WBC: 7.8 10*3/uL (ref 4.0–10.5)
nRBC: 0 % (ref 0.0–0.2)

## 2019-11-20 LAB — BASIC METABOLIC PANEL
Anion gap: 9 (ref 5–15)
BUN: 9 mg/dL (ref 8–23)
CO2: 33 mmol/L — ABNORMAL HIGH (ref 22–32)
Calcium: 8.3 mg/dL — ABNORMAL LOW (ref 8.9–10.3)
Chloride: 96 mmol/L — ABNORMAL LOW (ref 98–111)
Creatinine, Ser: 0.83 mg/dL (ref 0.61–1.24)
GFR, Estimated: >60 mL/min (ref >60)
Glucose, Bld: 90 mg/dL (ref 70–99)
Potassium: 3.5 mmol/L (ref 3.5–5.1)
Sodium: 138 mmol/L (ref 135–145)

## 2019-11-20 LAB — MAGNESIUM: Magnesium: 1.7 mg/dL (ref 1.7–2.4)

## 2019-11-20 MED ORDER — ROSUVASTATIN CALCIUM 10 MG PO TABS: 10.0000 mg | ORAL_TABLET | Freq: Every day | ORAL | 6 refills | Status: DC

## 2019-11-20 MED ORDER — ISOSORB DINITRATE-HYDRALAZINE 20-37.5 MG PO TABS: 0.5000 | ORAL_TABLET | Freq: Three times a day (TID) | ORAL | 6 refills | Status: DC

## 2019-11-20 MED ORDER — SPIRONOLACTONE 25 MG PO TABS: 25.0000 mg | ORAL_TABLET | Freq: Every day | ORAL | 6 refills | Status: DC

## 2019-11-20 MED ORDER — LEVOTHYROXINE SODIUM 50 MCG PO TABS: 50.0000 ug | ORAL_TABLET | Freq: Every day | ORAL | 6 refills | Status: DC

## 2019-11-20 MED ORDER — HYDROCORTISONE 5 MG PO TABS: 5.0000 mg | ORAL_TABLET | Freq: Every evening | ORAL | 6 refills | Status: DC

## 2019-11-20 MED ORDER — HYDROCORTISONE 10 MG PO TABS: 10.0000 mg | ORAL_TABLET | Freq: Every morning | ORAL | 6 refills | Status: DC

## 2019-11-20 MED ORDER — ASPIRIN 81 MG PO TBEC: 81.0000 mg | DELAYED_RELEASE_TABLET | Freq: Every day | ORAL | 11 refills | Status: AC

## 2019-11-20 MED ORDER — POTASSIUM CHLORIDE CRYS ER 20 MEQ PO TBCR: 20.0000 meq | EXTENDED_RELEASE_TABLET | Freq: Every day | ORAL | 6 refills | Status: DC

## 2019-11-20 MED ORDER — POTASSIUM CHLORIDE CRYS ER 20 MEQ PO TBCR
40.0000 meq | EXTENDED_RELEASE_TABLET | ORAL | Status: DC
  Administered 2019-11-20: 40 meq via ORAL
  Filled 2019-11-20: qty 2

## 2019-11-20 MED FILL — LEVOTHYROXINE SODIUM 50 MCG: 50 | 30 days supply | Qty: 30 | Fill #0

## 2019-11-20 MED FILL — ROSUVASTATIN CALCIUM 10 MG: 10 | 30 days supply | Qty: 30 | Fill #0

## 2019-11-20 MED FILL — HYDROCORTISONE 10 MG TABLET: 10 | 30 days supply | Qty: 30 | Fill #0

## 2019-11-20 MED FILL — SPIRONOLACTONE 25 MG TABLET: 25 | 30 days supply | Qty: 30 | Fill #0

## 2019-11-20 MED FILL — ASPIRIN LOW DOSE 81 MG TBEC: 81 | 30 days supply | Qty: 30 | Fill #0

## 2019-11-20 MED FILL — HYDROCORTISONE 5 MG TABLET: 5 | 30 days supply | Qty: 30 | Fill #0

## 2019-11-20 MED FILL — POTASSIUM CHLORIDE 20meqER: 20 | 30 days supply | Qty: 30 | Fill #0

## 2019-11-20 MED FILL — BIDIL 20-37.5 MG TABS: 20-37.5 | 45 days supply | Qty: 90 | Fill #0

## 2019-11-20 NOTE — Progress Notes (Signed)
D/C instructions given and reviewed. Questions asked and answered but encouraged to call with any f/u concerns. Tele and IV removed, tolerated well.

## 2019-11-20 NOTE — TOC Benefit Eligibility Note (Signed)
Transition of Care [facility]) Benefit Eligibility Note    Patient Details  Name: Steven Frazier MRN: [id number] Date of Birth: Jun 24, 1955   Medication/Dose: Bidil Tab,Tid  Covered?: Yes  Tier:  (?)  Prescription Coverage [facility]: [facility]  Spoke with Person/Company/Phone Number:: [facility] PH#[phone]  Co-Pay: $3.00  Prior Approval: No  Deductible:  (No Deductible)       Renie Ora Phone Number: 11/20/2019, 12:54 PM

## 2019-11-20 NOTE — Progress Notes (Addendum)
Advanced Heart Failure Rounding Note   Subjective:    Yesterday diuresed with IV lasix. Negative >2 liters.   Wants to go home. Denies. SOB.   Objective:   Weight Range:  Vital Signs:   Temp:  [97.5 F (36.4 C)-98.4 F (36.9 C)] 98 F (36.7 C) (10/22 0743) Pulse Rate:  [73-82] 73 (10/22 0355) Resp:  [16-19] 18 (10/22 0743) BP: (106-123)/(63-77) 106/74 (10/22 0743) SpO2:  [95 %-100 %] 96 % (10/22 0743) Weight:  [54.7 kg] 54.7 kg (10/22 0355) Last BM Date: 11/19/19  Weight change: Filed Weights   11/18/19 0355 11/19/19 0500 11/20/19 0355  Weight: 57.1 kg 55.4 kg 54.7 kg    Intake/Output:   Intake/Output Summary (Last 24 hours) at 11/20/2019 1139 Last data filed at 11/20/2019 0849 Gross per 24 hour  Intake 940 ml  Output 2250 ml  Net -1310 ml    PHYSICAL EXAM: General:  Well appearing. No resp difficulty HEENT: normal Neck: supple. no JVD. Carotids 2+ bilat; no bruits. No lymphadenopathy or thryomegaly appreciated. Cor: PMI nondisplaced. Regular rate & rhythm. No rubs, gallops or murmurs. Lungs: clear Abdomen: soft, nontender, nondistended. No hepatosplenomegaly. No bruits or masses. Good bowel sounds. Extremities: no cyanosis, clubbing, rash, edema Neuro: alert & orientedx3, cranial nerves grossly intact. moves all 4 extremities w/o difficulty. Affect pleasant  Telemetry: NSR    Labs: Basic Metabolic Panel: Recent Labs  Lab 11/16/19 0834 11/16/19 0834 11/16/19 1632 11/16/19 1636 11/17/19 0134 11/17/19 0134 11/18/19 0425 11/19/19 0300 11/20/19 0221  NA 139  --    < > 141 139  --  137 137 138  K 3.4*  --    < > 3.8 3.8  --  4.2 3.7 3.5  CL 95*  --   --   --  96*  --  97* 97* 96*  CO2 34*  --   --   --  30  --  32 32 33*  GLUCOSE 75  --   --   --  82  --  93 90 90  BUN <5*  --   --   --  <5*  --  <5* 5* 9  CREATININE 0.90  --   --   --  0.76  --  0.89 0.83 0.83  CALCIUM 8.3*   < >  --   --  8.0*   < > 8.2* 8.3* 8.3*  MG 1.7  --   --   --  1.9   --  1.9 1.8 1.7   < > = values in this interval not displayed.    Liver Function Tests: Recent Labs  Lab 11/13/19 1527  AST 31  ALT 18  ALKPHOS 115  BILITOT 0.6  PROT 6.0*  ALBUMIN 2.5*   No results for input(s): LIPASE, AMYLASE in the last 168 hours. No results for input(s): AMMONIA in the last 168 hours.  CBC: Recent Labs  Lab 11/13/19 1527 11/13/19 2045 11/16/19 0834 11/16/19 1632 11/16/19 1636 11/17/19 0134 11/18/19 0425 11/19/19 0300 11/20/19 0221  WBC 6.6   < > 6.7  --   --  5.5 7.1 9.2 7.8  NEUTROABS 2.5  --   --   --   --   --   --   --   --   HGB 11.5*   < > 13.6   < > 13.6 12.4* 12.7* 12.3* 12.6*  HCT 35.7*   < > 41.4   < > 40.0 37.4* 38.1* 36.8* 37.2*  MCV 76.9*   < > 75.3*  --   --  74.8* 75.0* 75.7* 75.2*  PLT 359   < > 341  --   --  294 329 317 318   < > = values in this interval not displayed.    Cardiac Enzymes: No results for input(s): CKTOTAL, CKMB, CKMBINDEX, TROPONINI in the last 168 hours.  BNP: BNP (last 3 results) Recent Labs    11/13/19 2045  BNP >4,500.0*    ProBNP (last 3 results) No results for input(s): PROBNP in the last 8760 hours.    Other results:  Imaging: No results found.   Medications:     Scheduled Medications: . aspirin EC  81 mg Oral Daily  . enoxaparin (LOVENOX) injection  40 mg Subcutaneous Q24H  . feeding supplement  237 mL Oral BID BM  . folic acid  1 mg Oral Daily  . furosemide  80 mg Intravenous BID  . hydrocortisone  10 mg Oral q AM  . hydrocortisone  5 mg Oral QPM  . isosorbide-hydrALAZINE  0.5 tablet Oral TID  . levothyroxine  50 mcg Oral Daily  . multivitamin with minerals  1 tablet Oral Daily  . potassium chloride  40 mEq Oral Q2H  . rosuvastatin  10 mg Oral Daily  . sacubitril-valsartan  1 tablet Oral BID  . sodium chloride flush  3 mL Intravenous Q12H  . sodium chloride flush  3 mL Intravenous Q12H  . spironolactone  25 mg Oral QHS    Infusions: . sodium chloride    . sodium  chloride      PRN Medications: sodium chloride, sodium chloride, acetaminophen, ondansetron (ZOFRAN) IV, sodium chloride flush, sodium chloride flush   Assessment/Plan:   1. Acute on chronic systolic HF with marked volume overload - ECG markedly abnormal suggesting ischemic etiology vs infiltrative process. Markedly elevated BNP, marked 1AVB and high lateral qs suggest possible infiltrative process) - auto-immune serologies negative. 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 Brain MRI unrevealing - cMRI suggestive of previous myocarditis vs infiltrative. EF 37% - PYP visual and quantitative assessment (grade 1 and H/CL = 1.18) are equivocal for transthyretin amyloidosis => probably not TTR amyloidosis. - diuresed 35 lb but remains fluid overloaded - Stop IV lasix. Tomorrow he was start torsemide 20 mg daily.  - Continue Entresto 49-51 mg bid  - Continue spiro 25 mg daily  - hold b-blocker for now with ADHF and marked 1AVB -Continue low dose Bidil 1/2 tablet tid . No room to increase.  - plan SGLT2i soon (can add as outpatient)    2. Adrenal Insuffiencey  - Significant Weight loss, unintentional - extensive work-up during recent admission was negative - CT C/A/P on 11/14/19 no evidence of malignancy - Serum Cortisol markedly low 0.4 - d/w endocrinology, start hydrocortisone 10 mg qam/ 5 mg pm, brain MRI w/ pituitary protocol unremarkable. -  Endocrinology will plan to f/u as outpatient   3. Eosinophilia, mild - AEC 924 currently - parasite work-up last admit was negative - doubt HES  4. Protein-calorie malnutrition, severe - prealbumin low 8.8 - Boost supplements - dietary consult placed, appreciate recs  5. Hypothyroidism - mild -synthroid has been increased to 50 mcg daily  6.  Hypokalemia - resolved 3.7 today   7. Microcytic anemia.  - hgb 12 - sats  16%. Ferritin 60  - getting dose of feraheme   Home today.   Length of Stay: [place] NP-C  11/20/2019, 11:39 AM   Patient seen with NP, agree with the above note.   He diuresed well yesterday, volume status improved.  JVP 7-8 cm.  I think that he can start on torsemide 20 mg daily today.   He will continue his current Bidil, Entresto, and spironolactone.  Close followup in CHF clinic.    He will continue hydrocortisone for adrenal insufficiency.   Loralie Champagne 11/20/2019 12:23 PM

## 2019-11-20 NOTE — Plan of Care (Signed)

## 2019-11-20 NOTE — Discharge Summary (Signed)
Advanced Heart Failure Team  Discharge Summary   Patient ID: Nahome Bublitz MRN: [id number], DOB/AGE: Jul 28, 1955 64 y.o. Admit date: 11/13/2019 D/C date:     11/20/2019   Primary Discharge Diagnoses:  1. A/C Systolic Hf  2. Adrenal Insufficiency 3. Eosinophilia 4. Protein Malnutrition, severe 5. Hypothyroidism 6. Hypokalemia  7. Microcytic Anemia   Hospital Course:  Elaine Poiis a 64 y.o.malefrom [place] (emigrated 3329) with systolic HF (EF 51-88%), HTN, and hypothyroidism.   [facility] with hypervolemic hyponatremia in the setting of systolic HF with Na 416.Also noted to have unintentional significant weight loss (~20-25 pounds), low albumin, microcytic anemia and eosinophilia. Work-up including peripheral smear, HIV, hepatitis and parasitic screens negative. During the admission, the patient was diuresed with lasix 2m IV BID with significant improvement in LE edema and Na. He was discharged home on lasix 238mdaily and lisinopril 57m87mith plans for follow-up in Cardiology clinic for further management.  Admitted with marked volume overload. Diuresed with IV lasix and transitioned to torsemide. Medications adjusted to aim for GDMT. Plan to add SGT2i in the community.    See below for detailed problems list. He will contine to be followed closely in the HF clinic.   1. Acute on chronic systolic HF with marked volume overload - ECG markedly abnormal suggesting ischemic etiology vs infiltrative process. Markedly elevated BNP, marked 1AVB and high lateral qs suggest possible infiltrative process) - auto-immune serologies negative. 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 Brain MRI unrevealing - cMRI suggestive of previous myocarditis vs infiltrative. EF 37% - PYP visual  and quantitative assessment (grade 1 and H/CL = 1.18) are equivocal for transthyretin amyloidosis => probably not TTR amyloidosis. - Diuresed with IV lasix and transition to torsemide 20 mg 11/21/19.   - Continue Entresto 49-51 mg bid  - Continue spiro 25 mg daily  - hold b-blocker for now with ADHF and marked 1AVB -Continue low dose Bidil 1/2 tablet tid . No room to increase.  - plan SGLT2i soon (can add as outpatient)    2. Adrenal Insuffiencey  - Significant Weight loss, unintentional - extensive work-up during recent admission was negative - CT C/A/P on 11/14/19 no evidence of malignancy - Serum Cortisol markedly low 0.4 - d/w endocrinology, started hydrocortisone 10 mg qam/ 5 mg pm, brain MRI w/ pituitary protocol unremarkable. -  Endocrinology will plan to f/u as outpatient   3. Eosinophilia, mild - AEC 924 currently - parasite work-up last admit was negative - doubt HES  4. Protein-calorie malnutrition, severe - prealbumin low 8.8 - Boost supplements - dietary consult placed, appreciate recs  5. Hypothyroidism - mild -synthroid has been increased to 50 mcg daily  6. Hypokalemia - K replaced as needed.    7. Microcytic anemia.  - hgb 12 - sats 16%. Ferritin 60  - getting dose of feraheme   Discharge Weight: 120 pounds.  Discharge Vitals: Blood pressure 106/74, pulse 73, temperature 98 F (36.7 C), temperature source Oral, resp. rate 18, height 5' 5"  (1.651 m), weight 54.7 kg, SpO2 96 %.  Labs: Lab Results  Component Value Date   WBC 7.8 11/20/2019   HGB 12.6 (L) 11/20/2019   HCT 37.2 (L) 11/20/2019   MCV 75.2 (L) 11/20/2019   PLT 318 11/20/2019    Recent Labs  Lab 11/13/19  1527 11/13/19 2045 11/20/19 0221  NA 136   < > 138  K 3.6   < > 3.5  CL 99   < > 96*  CO2 29   < > 33*  BUN <5*   < > 9  CREATININE 0.81   < > 0.83  CALCIUM 8.0*   < > 8.3*  PROT 6.0*  --   --   BILITOT 0.6  --   --   ALKPHOS 115  --   --   ALT 18  --   --   AST 31   --   --   GLUCOSE 111*   < > 90   < > = values in this interval not displayed.   Lab Results  Component Value Date   CHOL 102 09/04/2019   HDL 41 09/04/2019   LDLCALC 54 09/04/2019   TRIG 37 09/04/2019   BNP (last 3 results) Recent Labs    11/13/19 2045  BNP >4,500.0*    ProBNP (last 3 results) No results for input(s): PROBNP in the last 8760 hours.   Diagnostic Studies/Procedures   No results found.  Discharge Medications   Allergies as of 11/20/2019   No Known Allergies     Medication List    STOP taking these medications   furosemide 20 MG tablet Commonly known as: LASIX     TAKE these medications   aspirin 81 MG EC tablet Take 1 tablet (81 mg total) by mouth daily. Swallow whole. Start taking on: November 21, 2019   Entresto 49-51 MG Generic drug: sacubitril-valsartan Take 1 tablet by mouth 2 (two) times daily.   folic acid 1 MG tablet Commonly known as: FOLVITE Take 1 tablet (1 mg total) by mouth daily.   hydrocortisone 5 MG tablet Commonly known as: CORTEF Take 1 tablet (5 mg total) by mouth every evening.   hydrocortisone 10 MG tablet Commonly known as: CORTEF Take 1 tablet (10 mg total) by mouth in the morning. Start taking on: November 21, 2019   isosorbide-hydrALAZINE 20-37.5 MG tablet Commonly known as: BIDIL Take 0.5 tablets by mouth 3 (three) times daily.   levothyroxine 50 MCG tablet Commonly known as: SYNTHROID Take 1 tablet (50 mcg total) by mouth daily. Start taking on: November 21, 2019 What changed:   medication strength  how much to take   MULTIPLE VITAMIN PO Take 1 tablet by mouth daily.   potassium chloride SA 20 MEQ tablet Commonly known as: KLOR-CON Take 1 tablet (20 mEq total) by mouth daily.   rosuvastatin 10 MG tablet Commonly known as: CRESTOR Take 1 tablet (10 mg total) by mouth daily. Start taking on: November 21, 2019   spironolactone 25 MG tablet Commonly known as: ALDACTONE Take 1 tablet (25 mg  total) by mouth at bedtime.       Disposition   The patient will be discharged in stable condition to home. Discharge Instructions    (HEART FAILURE PATIENTS) Call MD:  Anytime you have any of the following symptoms: 1) 3 pound weight gain in 24 hours or 5 pounds in 1 week 2) shortness of breath, with or without a dry hacking cough 3) swelling in the hands, feet or stomach 4) if you have to sleep on extra pillows at night in order to breathe.   Complete by: As directed    Diet - low sodium heart healthy   Complete by: As directed    Heart Failure patients record your daily weight using the  same scale at the same time of day   Complete by: As directed    Increase activity slowly   Complete by: As directed       Follow-up Information    [place] Follow up on 11/25/2019.   Specialty: Cardiology Why: at 3;30 . Located on the 1st floor at [facility]. Entrance C Contact information: [address] [phone] [place] [phone]                Duration of Discharge Encounter: Greater than 35 minutes   Signed, Darrick Grinder NP-C  11/20/2019, 12:13 PM

## 2019-11-25 ENCOUNTER — Encounter (HOSPITAL_COMMUNITY): Payer: Self-pay

## 2019-11-25 ENCOUNTER — Ambulatory Visit (HOSPITAL_COMMUNITY)
Admit: 2019-11-25 | Discharge: 2019-11-25 | Disposition: A | Discharge status: Home / Self Care | Payer: Medicaid Other | Attending: Cardiology | Admitting: Cardiology

## 2019-11-25 ENCOUNTER — Other Ambulatory Visit: Payer: Self-pay

## 2019-11-25 VITALS — BP 117/78 | HR 93 | Wt 129.8 lb

## 2019-11-25 DIAGNOSIS — E2740 Unspecified adrenocortical insufficiency: Secondary | ICD-10-CM | POA: Diagnosis not present

## 2019-11-25 DIAGNOSIS — Z79899 Other long term (current) drug therapy: Secondary | ICD-10-CM | POA: Insufficient documentation

## 2019-11-25 DIAGNOSIS — E785 Hyperlipidemia, unspecified: Secondary | ICD-10-CM | POA: Diagnosis not present

## 2019-11-25 DIAGNOSIS — R634 Abnormal weight loss: Secondary | ICD-10-CM | POA: Insufficient documentation

## 2019-11-25 DIAGNOSIS — I5022 Chronic systolic (congestive) heart failure: Secondary | ICD-10-CM

## 2019-11-25 DIAGNOSIS — I110 Hypertensive heart disease with heart failure: Secondary | ICD-10-CM | POA: Insufficient documentation

## 2019-11-25 DIAGNOSIS — E279 Disorder of adrenal gland, unspecified: Secondary | ICD-10-CM | POA: Diagnosis not present

## 2019-11-25 LAB — BASIC METABOLIC PANEL
Anion gap: 8 (ref 5–15)
BUN: 7 mg/dL — ABNORMAL LOW (ref 8–23)
CO2: 23 mmol/L (ref 22–32)
Calcium: 8.5 mg/dL — ABNORMAL LOW (ref 8.9–10.3)
Chloride: 102 mmol/L (ref 98–111)
Creatinine, Ser: 0.82 mg/dL (ref 0.61–1.24)
GFR, Estimated: >60 mL/min (ref >60)
Glucose, Bld: 91 mg/dL (ref 70–99)
Potassium: 5 mmol/L (ref 3.5–5.1)
Sodium: 133 mmol/L — ABNORMAL LOW (ref 135–145)

## 2019-11-25 MED ORDER — DAPAGLIFLOZIN PROPANEDIOL 10 MG PO TABS: 10.0000 mg | ORAL_TABLET | Freq: Every day | ORAL | 3 refills | Status: DC

## 2019-11-25 NOTE — Progress Notes (Signed)
Advanced Heart Failure Clinic Note   Referring Physician: PCP: Trey Sailors, PA PCP-Cardiologist: Dr. Haroldine Laws   HPI:  Steven Frazier a 64 y.o.malefrom [place] (emigrated 1998)withsystolic HF (EF 03-50%), HTN, and hypothyroidism.   [facility] with hypervolemic hyponatremia in the setting of systolic HF with Na 093.Also noted to have unintentional significant weight loss(~20-25 pounds), low albumin, microcytic anemiaand eosinophilia. Work-up including peripheral smear, HIV, hepatitis and parasitic screens negative. During the admission, the patient was diuresed with lasix 48m IV BID with significant improvement in LE edema and Na. He was discharged home on lasix 23mdaily and lisinopril 27m66mith plans for follow-up in Cardiology clinic for further management.  Admitted again 10/21 with marked volume overload. Diuresed with IV lasix and transitioned to torsemide. Medications adjusted to aim for GDMT. Plan to add SGT2i in the community.     Also recently found to have adrenal insuffiencey. Cortisol level checked recent admit and found to be markedly low at 0.4. D/w endocrinology who recommended hydrocortisone 10 mg qam/ 5 mg pm + brain MRI w/ pituitary protocol. MRI was unremarkable. He has been referred to outpatient endocrinology for further management.   He presents to clinic today for f/u. Here w/ his daughter today. She and he both report that he has done remarkably well since returning home. Functional status much improved, now NYHA Class II (IIIb on admit). Has been taking walks to the park and went shopping yesterday without limitations and no exertional dyspnea w/ ALDs. His wt is up 6 lb since d/c but ReDs Clip only 27%. Per review of d/c AVS, he was not discharged home on a loop diuretic.    Echo 8/21: EF 40%. RV mildly reduced   Review of systems complete and found to be negative unless listed in HPI.     Past Medical History:   Diagnosis Date  . Heart failure (H[facility] . Hypertension   . Hyponatremia   . Hypothyroidism   . Shoulder pain     Current Outpatient Medications  Medication Sig Dispense Refill  . aspirin EC 81 MG EC tablet Take 1 tablet (81 mg total) by mouth daily. Swallow whole. 30 tablet 11  . ENTRESTO 49-51 MG Take 1 tablet by mouth 2 (two) times daily.    . folic acid (FOLVITE) 1 MG tablet Take 1 tablet (1 mg total) by mouth daily. 30 tablet 0  . hydrocortisone (CORTEF) 10 MG tablet Take 1 tablet (10 mg total) by mouth in the morning. 30 tablet 6  . hydrocortisone (CORTEF) 5 MG tablet Take 1 tablet (5 mg total) by mouth every evening. 30 tablet 6  . isosorbide-hydrALAZINE (BIDIL) 20-37.5 MG tablet Take 0.5 tablets by mouth 3 (three) times daily. 90 tablet 6  . levothyroxine (SYNTHROID) 50 MCG tablet Take 1 tablet (50 mcg total) by mouth daily. 30 tablet 6  . MULTIPLE VITAMIN PO Take 1 tablet by mouth daily.    . potassium chloride SA (KLOR-CON) 20 MEQ tablet Take 1 tablet (20 mEq total) by mouth daily. 30 tablet 6  . rosuvastatin (CRESTOR) 10 MG tablet Take 1 tablet (10 mg total) by mouth daily. 30 tablet 6  . spironolactone (ALDACTONE) 25 MG tablet Take 1 tablet (25 mg total) by mouth at bedtime. 30 tablet 6   No current facility-administered medications for this encounter.    No Known Allergies    Social History   Socioeconomic History  . Marital status: Married    Spouse  name: Not on file  . Number of children: Not on file  . Years of education: Not on file  . Highest education level: Not on file  Occupational History  . Not on file  Tobacco Use  . Smoking status: Never Smoker  . Smokeless tobacco: Never Used  Substance and Sexual Activity  . Alcohol use: Not Currently  . Drug use: Never  . Sexual activity: Not on file  Other Topics Concern  . Not on file  Social History Narrative  . Not on file   Social Determinants of Health   Financial Resource Strain:   . Difficulty  of Paying Living Expenses: Not on file  Food Insecurity: No Food Insecurity  . Worried About Charity fundraiser in the Last Year: Never true  . Ran Out of Food in the Last Year: Never true  Transportation Needs: No Transportation Needs  . Lack of Transportation (Medical): No  . Lack of Transportation (Non-Medical): No  Physical Activity:   . Days of Exercise per Week: Not on file  . Minutes of Exercise per Session: Not on file  Stress:   . Feeling of Stress : Not on file  Social Connections:   . Frequency of Communication with Friends and Family: Not on file  . Frequency of Social Gatherings with Friends and Family: Not on file  . Attends Religious Services: Not on file  . Active Member of Clubs or Organizations: Not on file  . Attends Archivist Meetings: Not on file  . Marital Status: Not on file  Intimate Partner Violence:   . Fear of Current or Ex-Partner: Not on file  . Emotionally Abused: Not on file  . Physically Abused: Not on file  . Sexually Abused: Not on file     No family history on file.  Vitals:   11/25/19 1530  BP: 117/78  Pulse: 93  SpO2: 96%  Weight: 58.9 kg     PHYSICAL EXAM: General:  Thin male, looks much older than actual age. No respiratory difficulty HEENT: normal Neck: supple. no JVD. Carotids 2+ bilat; no bruits. No lymphadenopathy or thyromegaly appreciated. Cor: PMI nondisplaced. Regular rate & rhythm. No rubs, gallops or murmurs. Lungs: clear Abdomen: soft, nontender, nondistended. No hepatosplenomegaly. No bruits or masses. Good bowel sounds. Extremities: no cyanosis, clubbing, rash, edema Neuro: alert & oriented x 3, cranial nerves grossly intact. moves all 4 extremities w/o difficulty. Affect pleasant.  ECG: not performed    ASSESSMENT & PLAN:  1. Chronic Systolic Heart Failure - Echo 8/21 showed EF 40%. RV mildly reduced  - recent admit 8/21 and 10/21 for a/c CHF>>diuresed w/ IV lasix  - ECG markedly abnormal  suggesting ischemic etiology vs infiltrative process. Markedly elevated BNP, marked 1AVB and high lateral qs suggest possible infiltrative process) - auto-immune serologies negative. ESR 5, SPEP/serum light chains negative - serum cortisol level markedly low at 0.4, raising concern for secondary adrenocortical insufficiency as etiology of HF - d/w endocrinology and started hydrocortisone 10 mg qam/ 5 mg pm. Brain MRI unrevealing - cMRI suggestive of previous myocarditis vs infiltrative. EF 37% - PYP visual and quantitative assessment (grade 1 and H/CL = 1.18) are equivocal for transthyretin amyloidosis => probably not TTR amyloidosis.  - doing well symptomatically post discharge. Functional Class improved, NYHA II  - Wt up 6 lb since d/c. ReDs Clip 27%  - Add Farxiga 10 mg daily  - Continue Entresto 49-51 mg bid. BP too soft for increase   -  Continue Spiro 25 mg daily   - Continue Bidil 1/2 tablet tid   - avoiding  blocker w/ marked 1AVB  - check BMP today and again in 7 days after start of Farxiga    2. Adrenal Insuffiencey  - recent significant wt loss, unintentional  - extensive work-up during recent admission 8/21 was negative - CT C/A/P on 11/14/19 no evidence of malignancy - Serum Cortisol markedly low 0.4 - d/w endocrinology, started hydrocortisone 10 mg qam/ 5 mg pm, brain MRI w/ pituitary protocol unremarkable.  - continue current regimen of hydrocodone 10 mg qam/5 mg qpm   -Endocrinology will plan to f/u as outpatient. I have placed referral   F/u w/ PharmD in 3-4 weeks for further med titration. F/u w/ APP in 8 weeks    Lyda Jester, PA-C 11/25/19

## 2019-11-25 NOTE — Progress Notes (Signed)
ReDS Vest / Clip - 11/25/19 1530      ReDS Vest / Clip   Station Marker A    Ruler Value 30    ReDS Value Range Low volume    ReDS Actual Value 27    Anatomical Comments sitting

## 2019-11-25 NOTE — Patient Instructions (Addendum)
START FArxiga 10mg  (1 tablet) Daily  Labs done today, your results will be available in MyChart, we will contact you for abnormal readings.  You have been referred to Endocrinology. They will contact you to schedule an appointment  Your physician recommends that you return for   Repeat labs in 1 week   Pharmacy visit in 4 weeks   Follow up visit with our APP clinic in 8 weeks  If you have any questions or concerns before your next appointment please send a message through [place] or call our office at [phone].    TO LEAVE A MESSAGE FOR THE NURSE SELECT OPTION 2, PLEASE LEAVE A MESSAGE INCLUDING: . YOUR NAME . DATE OF BIRTH . CALL BACK NUMBER . REASON FOR CALL**this is important as we prioritize the call backs  YOU WILL RECEIVE A CALL BACK THE SAME DAY AS LONG AS YOU CALL BEFORE 4:00 PM

## 2019-11-26 ENCOUNTER — Telehealth (HOSPITAL_COMMUNITY): Payer: Self-pay | Admitting: Pharmacy Technician

## 2019-11-26 LAB — HEMOGLOBIN A1C
Hgb A1c MFr Bld: 5.7 % — ABNORMAL HIGH (ref 4.8–5.6)
Mean Plasma Glucose: 117 mg/dL

## 2019-11-26 NOTE — Telephone Encounter (Signed)
Advanced Heart Failure Patient Advocate Encounter  Prior Authorization for Marcelline Deist has been approved.    PA# [id number] Effective dates: 11/26/19 through 11/25/20  Archer Asa, CPhT

## 2019-11-26 NOTE — Telephone Encounter (Signed)
Patient Advocate Encounter   Received notification from [facility] that prior authorization for Steven Frazier is required.   PA submitted on CoverMyMeds Key [id number] Status is pending   Will continue to follow.

## 2019-12-02 ENCOUNTER — Ambulatory Visit (HOSPITAL_COMMUNITY)
Admission: RE | Admit: 2019-12-02 | Discharge: 2019-12-02 | Disposition: A | Discharge status: Home / Self Care | Payer: Medicaid Other | Source: Ambulatory Visit | Attending: Internal Medicine | Admitting: Internal Medicine

## 2019-12-02 ENCOUNTER — Other Ambulatory Visit: Payer: Self-pay

## 2019-12-02 DIAGNOSIS — I5022 Chronic systolic (congestive) heart failure: Secondary | ICD-10-CM

## 2019-12-02 LAB — BASIC METABOLIC PANEL
Anion gap: 6 (ref 5–15)
BUN: 5 mg/dL — ABNORMAL LOW (ref 8–23)
CO2: 28 mmol/L (ref 22–32)
Calcium: 8.8 mg/dL — ABNORMAL LOW (ref 8.9–10.3)
Chloride: 103 mmol/L (ref 98–111)
Creatinine, Ser: 0.84 mg/dL (ref 0.61–1.24)
GFR, Estimated: >60 mL/min (ref >60)
Glucose, Bld: 87 mg/dL (ref 70–99)
Potassium: 4.7 mmol/L (ref 3.5–5.1)
Sodium: 137 mmol/L (ref 135–145)

## 2019-12-15 NOTE — Progress Notes (Signed)
Referring Physician: PCP: Trey Sailors, PA PCP-Cardiologist: Dr. Haroldine Laws   HPI:  Shunsuke Poiis a 64 y.o[place]withsystolic HF (EF 99-24%), HTN, andhypothyroidism.   [facility] with hypervolemic hyponatremia in the setting of systolic HF with Na 268.Also noted to have unintentional significant weight loss(~20-25 pounds), low albumin, microcytic anemiaand eosinophilia. Work-up including peripheral smear, HIV, hepatitis and parasitic screens negative. During the admission, the patient was diuresed with furosemide 8m IV BID with significant improvement in LE edema and Na. He was discharged home on furosemide 20 mg daily and lisinopril 5 mg daily with plans for follow-up in Cardiology clinic for further management.  Admitted again 10/21 with marked volume overload. Diuresed with IV furosemide and transitioned to torsemide. Medications adjusted to aim for GDMT. Plan to add SGT2i in the community.   Also recently found to have adrenal insufficiency. Cortisol level checked recent admit and found to be markedly low at 0.4. D/w endocrinology who recommended hydrocortisone 10 mg qam/ 5 mg pm + brain MRI w/ pituitary protocol. MRI was unremarkable. He has been referred to outpatient endocrinology for further management.   He recently presented to HF Clinic for follow up on 11/25/19. Came with his daughter. She and he both reported that he had done remarkably well since returning home. Functional status was much improved, now NYHA Class II (IIIb on admit). Had been taking walks to the park and went shopping yesterday without limitations and no exertional dyspnea with ADLs. His weight was up 6 lbs since d/c but ReDs Clip only 27%. Per review of d/c AVS, he was not discharged home on a loop diuretic.   Today he returns to HF clinic for pharmacist medication titration. At last visit with APP Clinic, Farxiga 10 mg daily was  initiated. Here with his 2 daughters. In a wheelchair because he recently hurt his leg. Overall he is feeling well today. No dizziness, lightheadedness, chest pain or palpitations. His breathing has been good. No SOB/DOE. Activity level limited since he hurt his leg. Before this, he was very active. His weights at home have been stable. SBP at home typically range 120-130s. BP in clinic was 110/68. No LEE, PND or orthopnea. Appetite has been good. Taking all medications as prescribed and tolerating all medications.    HF Medications: Entresto 49/51 mg BID Spironolactone 25 mg daily Dapagliflozin 10 mg daily Bidil 20-37.5 mg 1/2 tablet TID Potassium chloride 20 mEq daily  Has the patient been experiencing any side effects to the medications prescribed?  no  Does the patient have any problems obtaining medications due to transportation or finances?   No - has BCBS Medicaid  Understanding of regimen: fair - his youngest daughter manages his medications.  Understanding of indications: fair Potential of compliance: good Patient understands to avoid NSAIDs. Patient understands to avoid decongestants.    Pertinent Lab Values 12/02/19: . Serum creatinine 0.84, BUN 5, Potassium 4.7, Sodium 137  Vital Signs: . Weight: 135 lbs (last clinic weight: 130 lbs) . Blood pressure: 110/68  . Heart rate: 85   Assessment: 1. Chronic Systolic Heart Failure - Echo 8/21 showed EF 40%. RV mildly reduced  - recent admit 8/21 and 10/21 for a/c CHF>>diuresed w/ IV furosemide  - ECG markedly abnormal suggesting ischemic etiology vs infiltrative process. Markedly elevated BNP, marked 1AVB and high lateral qs suggest possible infiltrative process) - auto-immune serologies negative. 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 Brain  MRI unrevealing - cMRI suggestive of previous myocarditis vs infiltrative. EF 37% - PYP visual and quantitative assessment (grade 1 and H/CL = 1.18) are equivocal for transthyretin amyloidosis => probably not TTR amyloidosis.   NYHA II symptoms, euvolemic on exam  - Not taking any diuretic  - avoiding ? blocker w/ marked 1AVB  - Continue Entresto 49-51 mg BID.   - Continue Spironolactone 25 mg daily   - Increase Bidil to 1 tablet TID  - Continue Farxiga 10 mg daily  Stop potassium supplements as K was at high end of normal on last 2 lab draws in clinic. No longer taking diuretic.    2. Adrenal Insuffiencey  - recent significant wt loss, unintentional  - extensive work-up during recent admission 8/21 was negative - CT C/A/P on 11/14/19 no evidence of malignancy - Serum Cortisol markedly low 0.4 - d/w endocrinology, startedhydrocortisone 10 mg qam/ 5 mg pm, brain MRI w/ pituitary protocol unremarkable.  - continue current regimen of hydrocodone 10 mg qam/5 mg qpm   -Endocrinology following   Plan: 1) Medication changes: Based on clinical presentation, vital signs and recent labs will increase Bidil to 1 tablet TID and stop potassium supplements.  2) Follow-up: 1 month with APP Clinic   Audry Riles, PharmD, BCPS, BCCP, CPP Heart Failure Clinic Pharmacist [phone]

## 2019-12-21 MED FILL — LEVOTHYROXINE 50 MCG TABLET: 50 | 30 days supply | Qty: 30 | Fill #0

## 2019-12-21 MED FILL — HYDROCORTISONE 10 MG TABLET: 10 | 30 days supply | Qty: 30 | Fill #0

## 2019-12-21 MED FILL — ROSUVASTATIN CALCIUM 10 MG: 10 | 30 days supply | Qty: 30 | Fill #0

## 2019-12-21 MED FILL — POTASSIUM CHLORIDE CRYS ER: 20 | 30 days supply | Qty: 30 | Fill #0

## 2019-12-21 MED FILL — HYDROCORTISONE 5 MG TABLET: 5 | 30 days supply | Qty: 30 | Fill #0

## 2019-12-21 MED FILL — SPIRONOLACTONE 25 MG TABS: 25 | 30 days supply | Qty: 30 | Fill #0

## 2019-12-28 ENCOUNTER — Ambulatory Visit (HOSPITAL_COMMUNITY)
Admission: RE | Admit: 2019-12-28 | Discharge: 2019-12-28 | Disposition: A | Discharge status: Home / Self Care | Payer: Medicaid Other | Source: Ambulatory Visit | Attending: Cardiology | Admitting: Cardiology

## 2019-12-28 ENCOUNTER — Other Ambulatory Visit: Payer: Self-pay

## 2019-12-28 ENCOUNTER — Other Ambulatory Visit (HOSPITAL_COMMUNITY): Payer: Self-pay | Admitting: Internal Medicine

## 2019-12-28 VITALS — BP 110/68 | HR 85 | Wt 135.0 lb

## 2019-12-28 DIAGNOSIS — I110 Hypertensive heart disease with heart failure: Secondary | ICD-10-CM | POA: Diagnosis not present

## 2019-12-28 DIAGNOSIS — I5022 Chronic systolic (congestive) heart failure: Secondary | ICD-10-CM | POA: Insufficient documentation

## 2019-12-28 DIAGNOSIS — E2740 Unspecified adrenocortical insufficiency: Secondary | ICD-10-CM | POA: Diagnosis not present

## 2019-12-28 MED ORDER — ISOSORB DINITRATE-HYDRALAZINE 20-37.5 MG PO TABS
1.0000 | ORAL_TABLET | Freq: Three times a day (TID) | ORAL | 11 refills | Status: DC
Start: 2019-12-28 — End: 2019-12-28

## 2019-12-28 MED FILL — BIDIL 20-37.5 MG TABS: 20-37.5 | 30 days supply | Qty: 90 | Fill #0

## 2019-12-28 NOTE — Patient Instructions (Signed)
It was a pleasure seeing you today!  MEDICATIONS: -We are changing your medications today -Stop potassium supplements -Increase Bidil to 1 tablet three times daily -Call if you have questions about your medications.   NEXT APPOINTMENT: Return to clinic in 1 month with Dr. Gala Romney.  In general, to take care of your heart failure: -Limit your fluid intake to 2 Liters (half-gallon) per day.   -Limit your salt intake to ideally 2-3 grams (2000-3000 mg) per day. -Weigh yourself daily and record, and bring that "weight diary" to your next appointment.  (Weight gain of 2-3 pounds in 1 day typically means fluid weight.) -The medications for your heart are to help your heart and help you live longer.   -Please contact us before stopping any of your heart medications.  Call the clinic at [phone] with questions or to reschedule future appointments.

## 2020-01-05 ENCOUNTER — Encounter: Payer: Self-pay | Admitting: Internal Medicine

## 2020-01-14 MED FILL — LEVOTHYROXINE 50 MCG TABLET: 50 | 30 days supply | Qty: 30 | Fill #1

## 2020-01-14 MED FILL — ROSUVASTATIN CALCIUM 10 MG: 10 | 30 days supply | Qty: 30 | Fill #1

## 2020-01-14 MED FILL — HYDROCORTISONE 5 MG TABLET: 5 | 30 days supply | Qty: 30 | Fill #1

## 2020-01-14 MED FILL — SPIRONOLACTONE 25 MG TABS: 25 | 30 days supply | Qty: 30 | Fill #1

## 2020-01-14 MED FILL — HYDROCORTISONE 10 MG TABLET: 10 | 30 days supply | Qty: 30 | Fill #1

## 2020-02-02 ENCOUNTER — Other Ambulatory Visit: Payer: Self-pay

## 2020-02-02 ENCOUNTER — Ambulatory Visit (HOSPITAL_COMMUNITY)
Admission: RE | Admit: 2020-02-02 | Discharge: 2020-02-02 | Disposition: A | Discharge status: Home / Self Care | Payer: Medicaid Other | Source: Ambulatory Visit | Attending: Cardiology | Admitting: Cardiology

## 2020-02-02 ENCOUNTER — Encounter (HOSPITAL_COMMUNITY): Payer: Self-pay

## 2020-02-02 VITALS — BP 160/100 | HR 95 | Wt 146.2 lb

## 2020-02-02 DIAGNOSIS — Z7982 Long term (current) use of aspirin: Secondary | ICD-10-CM | POA: Insufficient documentation

## 2020-02-02 DIAGNOSIS — E039 Hypothyroidism, unspecified: Secondary | ICD-10-CM | POA: Insufficient documentation

## 2020-02-02 DIAGNOSIS — I5022 Chronic systolic (congestive) heart failure: Secondary | ICD-10-CM | POA: Diagnosis not present

## 2020-02-02 DIAGNOSIS — I5023 Acute on chronic systolic (congestive) heart failure: Secondary | ICD-10-CM | POA: Diagnosis present

## 2020-02-02 DIAGNOSIS — I428 Other cardiomyopathies: Secondary | ICD-10-CM | POA: Diagnosis not present

## 2020-02-02 DIAGNOSIS — I110 Hypertensive heart disease with heart failure: Secondary | ICD-10-CM | POA: Diagnosis not present

## 2020-02-02 DIAGNOSIS — H538 Other visual disturbances: Secondary | ICD-10-CM | POA: Insufficient documentation

## 2020-02-02 DIAGNOSIS — Z79899 Other long term (current) drug therapy: Secondary | ICD-10-CM | POA: Insufficient documentation

## 2020-02-02 LAB — BASIC METABOLIC PANEL
Anion gap: 9 (ref 5–15)
BUN: 5 mg/dL — ABNORMAL LOW (ref 8–23)
CO2: 22 mmol/L (ref 22–32)
Calcium: 8.6 mg/dL — ABNORMAL LOW (ref 8.9–10.3)
Chloride: 108 mmol/L (ref 98–111)
Creatinine, Ser: 0.9 mg/dL (ref 0.61–1.24)
GFR, Estimated: >60 mL/min (ref >60)
Glucose, Bld: 98 mg/dL (ref 70–99)
Potassium: 4.7 mmol/L (ref 3.5–5.1)
Sodium: 139 mmol/L (ref 135–145)

## 2020-02-02 MED ORDER — SPIRONOLACTONE 50 MG PO TABS
50.0000 mg | ORAL_TABLET | Freq: Every day | ORAL | 6 refills | Status: DC
Start: 2020-02-02 — End: 2020-08-17

## 2020-02-02 MED ORDER — TORSEMIDE 20 MG PO TABS: 20.0000 mg | ORAL_TABLET | Freq: Every day | ORAL | 6 refills | Status: DC

## 2020-02-02 NOTE — Patient Instructions (Signed)
START Torsemide 20 mg ,one tab daily START Spironolactone 50 mg, one tab daily  Labs today We will only contact you if something comes back abnormal or we need to make some changes. Otherwise no news is good news!  Your physician recommends that you schedule a follow-up appointment in: 1 week  in the Advanced Practitioners (PA/NP) Clinic    If you have any questions or concerns before your next appointment please send [place] a message through [place] or call our office at [phone].    TO LEAVE A MESSAGE FOR THE NURSE SELECT OPTION 2, PLEASE LEAVE A MESSAGE INCLUDING: . YOUR NAME . DATE OF BIRTH . CALL BACK NUMBER . REASON FOR CALL**this is important as we prioritize the call backs  YOU WILL RECEIVE A CALL BACK THE SAME DAY AS LONG AS YOU CALL BEFORE 4:00 PM  It was great to see you today! No medication changes are needed at this time.

## 2020-02-02 NOTE — Progress Notes (Addendum)
Advanced Heart Failure Clinic Note   Referring Physician: PCP: Trey Sailors, PA PCP-Cardiologist: Dr. Haroldine Laws   Reason for Visit: f/u for chronic systolic heart failure   HPI:  Steven Frazier a 65 y.o.malefrom [place] ([place] 1998)withsystolic HF (EF 43-32%), HTN, and hypothyroidism.   [facility] with hypervolemic hyponatremia in the setting of systolic HF with Na 951.Also noted to have unintentional significant weight loss(~20-25 pounds), low albumin, microcytic anemiaand eosinophilia. Work-up including peripheral smear, HIV, hepatitis and parasitic screens negative. During the admission, the patient was diuresed with lasix 67m IV BID with significant improvement in LE edema and Na. He was discharged home on lasix 227mdaily and lisinopril 47m647mith plans for follow-up in Cardiology clinic for further management.  Admitted again 10/21 with marked volume overload. [facility] with normal coronaries and mildly elevated filling pressures w/ normal cardiac output. Diuresed with IV lasix and transitioned to torsemide. Medications adjusted to aim for GDMT.  Also recently found to have adrenal insuffiencey. Cortisol level checked recent admit and found to be markedly low at 0.4. D/w endocrinology who recommended hydrocortisone 10 mg qam/ 5 mg pm + brain MRI w/ pituitary protocol. MRI was unremarkable. He has been referred to outpatient endocrinology for further management.   He presents to clinic today for f/u. Here w/ his son and daughter. He reports increased wt gain over the course of several weeks. Also w/ increased exertional dyspnea. No resting dyspnea. Denies orthopnea/PND but does note bendopnea. Wt is up ~20 lb since hospital d/c. It appears he is no longer on torsemide. He is on EntBermudae saw his PCP recently who increased his Entresto to 97-103 mg bid and he reports full compliance. He  does report decreased urinary output recently. BP elevated at 160/100. He took his AM meds this morning. Denies any dietary changes. Denies dietary indiscretion w/ sodium.   He is also requesting referral to an opthalmologist. Complains of gradual visual difficulties. No acute changes. ? Cataracts   Cardiac Studies  Echo 8/21: EF 40%. RV mildly reduced   R/LHC 10/21:  Ao = 123/72 (94) LV = 122/15 RA = 9 RV = 48/12 PA = 49/17 (31) PCW = 22 (v = 31) Fick cardiac output/index = 4.1/2.4 PVR = 2.2 WU  FA sat = 99% PA sat = 68%, 69% PAPi = 3.5  Assessment: 1. Severe NICM EF 20% 2. Normal coronaries 3. Mildly elevated filling pressures with normal CO  cMRI 10/21  IMPRESSION: 1. Diffuse subcutaneous and mesenteric edema with small amount of ascites along the paracolic gutters and mild perihepatic and perisplenic ascites. The appearance is compatible with third spacing of fluid and may be related to the patient's hypoalbuminemia and hypoproteinemia. 2. Mild to moderate cardiomegaly. 3. Small left and moderate right pleural effusions with passive atelectasis. 4. Prominence of the lateral segment left hepatic lobe could be a sign of early cirrhosis. 5. Nonunited fractures of the left ninth, tenth, and eleventh ribs posterolaterally with a partially healed fracture of the left twelfth rib posterolaterally. 6. Disc bulges at L3-4, L4-5, and L5-S1. 7. Small left hydrocele along the spermatic cord. 8. Aortic atherosclerosis.  Review of systems complete and found to be negative unless listed in HPI.     Past Medical History:  Diagnosis Date  . Heart failure (HCCAshley . Hypertension   . Hyponatremia   . Hypothyroidism   . Shoulder pain  Current Outpatient Medications  Medication Sig Dispense Refill  . aspirin EC 81 MG EC tablet Take 1 tablet (81 mg total) by mouth daily. Swallow whole. 30 tablet 11  . dapagliflozin propanediol (FARXIGA) 10 MG TABS tablet Take 1 tablet  (10 mg total) by mouth daily before breakfast. 30 tablet 3  . folic acid (FOLVITE) 1 MG tablet Take 1 tablet (1 mg total) by mouth daily. 30 tablet 0  . hydrocortisone (CORTEF) 10 MG tablet Take 1 tablet (10 mg total) by mouth in the morning. 30 tablet 6  . hydrocortisone (CORTEF) 5 MG tablet Take 1 tablet (5 mg total) by mouth every evening. 30 tablet 6  . isosorbide-hydrALAZINE (BIDIL) 20-37.5 MG tablet Take 1 tablet by mouth 3 (three) times daily. 90 tablet 11  . levothyroxine (SYNTHROID) 50 MCG tablet Take 1 tablet (50 mcg total) by mouth daily. 30 tablet 6  . MULTIPLE VITAMIN PO Take 1 tablet by mouth daily.    . rosuvastatin (CRESTOR) 10 MG tablet Take 1 tablet (10 mg total) by mouth daily. 30 tablet 6  . sacubitril-valsartan (ENTRESTO) 97-103 MG Take 1 tablet by mouth 2 (two) times daily.    Marland Kitchen spironolactone (ALDACTONE) 25 MG tablet Take 1 tablet (25 mg total) by mouth at bedtime. 30 tablet 6   No current facility-administered medications for this encounter.    No Known Allergies    Social History   Socioeconomic History  . Marital status: Married    Spouse name: Not on file  . Number of children: Not on file  . Years of education: Not on file  . Highest education level: Not on file  Occupational History  . Not on file  Tobacco Use  . Smoking status: Never Smoker  . Smokeless tobacco: Never Used  Substance and Sexual Activity  . Alcohol use: Not Currently  . Drug use: Never  . Sexual activity: Not on file  Other Topics Concern  . Not on file  Social History Narrative  . Not on file   Social Determinants of Health   Financial Resource Strain: Not on file  Food Insecurity: No Food Insecurity  . Worried About Charity fundraiser in the Last Year: Never true  . Ran Out of Food in the Last Year: Never true  Transportation Needs: No Transportation Needs  . Lack of Transportation (Medical): No  . Lack of Transportation (Non-Medical): No  Physical Activity: Not on file   Stress: Not on file  Social Connections: Not on file  Intimate Partner Violence: Not on file     No family history on file.  Vitals:   02/02/20 1446  BP: (!) 160/100  Pulse: 95  SpO2: 97%  Weight: 66.3 kg    PHYSICAL EXAM: General: chronically ill appearing male, looks much older than actual age. No respiratory difficulty HEENT: normal Neck: supple. JVD elevated to jaw. Carotids 2+ bilat; no bruits. No lymphadenopathy or thyromegaly appreciated. Cor: PMI nondisplaced. Regular rate & rhythm. No rubs, gallops or murmurs. Lungs: clear Abdomen: soft, nontender, nondistended. No hepatosplenomegaly. No bruits or masses. Good bowel sounds. Extremities: no cyanosis, clubbing, rash, trace bilateral ankle edema Neuro: alert & oriented x 3, cranial nerves grossly intact. moves all 4 extremities w/o difficulty. Affect pleasant.   ECG: not performed    ASSESSMENT & PLAN:  1. Acute on Chronic Systolic Heart Failure - Echo 8/21 showed EF 40%. RV mildly reduced  - [facility] 10/21 showed severe NICM EF 20%, with normal coronaries and mildly  elevated filling pressures w/ normal cardiac output. - cMRI suggestive of previous myocarditis vs infiltrative. EF 37% - PYP visual and quantitative assessment (grade 1 and H/CL = 1.18) are equivocal for transthyretin amyloidosis => probably not TTR amyloidosis. - auto-immune serologies negative. ESR 5, SPEP/serum light chains negative - ? secondary adrenocortical insufficiency as etiology of HF. Serum cortisol level was markedly low at 0.4 (10/21)>>now on hydrocortisone 10 mg qam/ 5 mg pm. Brain MRI unrevealing  - volume status elevated, up ~20 lb from dry wt. BP elevated. NYHA Class II-III  - Restart torsemide 20 mg daily   - Increase Spiro to 50 mg daily   - Continue Farxiga 10 mg daily  - Continue Entresto 97-103 mg bid.    - Continue Bidil 1 tablet tid   - avoiding  blocker w/ marked 1st degree AVB  - check BMP today and again in 7 days    - discussed sodium and fluid restriction and daily wts    2. Adrenal Insuffiencey  - Serum Cortisol markedly low 0.4 10/21  - brain MRI w/ pituitary protocol unremarkable.  C/w hydrocortisone 10 mg qam/ 5 mg pm,   He has been referred to Endocrinology. He has f/u next week on 1/10   3. HTN - BP elevated at 160/100 - titrate meds as outlined in prob # 1  4. Visual Impairment - ? Cataracts - refer to ophthalmology   F/u BMP in 7 days w/ APP f/u in 2 weeks to reassess volume status    Lyda Jester, PA-C 02/02/20

## 2020-02-03 MED FILL — BIDIL 20-37.5 MG TABS: 20-37.5 | 30 days supply | Qty: 90 | Fill #1

## 2020-02-04 ENCOUNTER — Encounter (HOSPITAL_COMMUNITY): Payer: Self-pay

## 2020-02-04 NOTE — Addendum Note (Signed)
Encounter addended by: Allayne Butcher, PA-C on: 02/04/2020 1:43 PM  Actions taken: Clinical Note Signed, Medication List reviewed, Problem List reviewed, Allergies reviewed

## 2020-02-05 NOTE — Progress Notes (Signed)
Name: Steven Frazier  MRN/ DOB: [id number], November 21, 1955    Age/ Sex: 65 y.o., male    PCP: Norm Salt, PA   Reason for Endocrinology Evaluation: Adrenal Insufficiency     Date of Initial Endocrinology Evaluation: 02/08/2020     HPI: Steven Frazier is a 65 y.o. male with a past medical history of CHF and HTN. The patient presented for initial endocrinology clinic visit on 02/08/2020 for consultative assistance with his adrenal insufficiency     Pt presented to the ED in 08/2019 with hypervolemic hyponatremia ( Sodium 117 mmol/L) with low albumin at < 2 g/dL, low serum osmolality at 245 mOsm/Kg , TSH slightly elevated at 9.4 uIU/mL . Urine sodium was 26 mg/dL with a urine osmolality at 284 .  Pt had a random serum cortisol of 0.4 ug/dL on 02/06/3233 and was started on hydrocortisone    He is accompanied by his daughter Deirdre Peer from [place] , [place] ~ 2 yrs ago   No prior use of steroids   Energy level has improved  NO prior head injury   Denies diarrhea   Weight has increased  Denies ETOH intake        ENDOCRINE HOME MEDICATION  HC 10 mg QAM  HC 5 mg QPM  Around 6 pm  Levothyroxine 50 mcg       HISTORY:  Past Medical History:  Past Medical History:  Diagnosis Date  . Heart failure (HCC)   . Hypertension   . Hyponatremia   . Hypothyroidism   . Shoulder pain    Past Surgical History:  Past Surgical History:  Procedure Laterality Date  . HERNIA REPAIR    . NO PAST SURGERIES    . RIGHT/LEFT HEART CATH AND CORONARY ANGIOGRAPHY N/A 11/16/2019   Procedure: RIGHT/LEFT HEART CATH AND CORONARY ANGIOGRAPHY;  Surgeon: Dolores Patty, MD;  Location: [facility];  Service: Cardiovascular;  Laterality: N/A;      Social History:  reports that he has never smoked. He has never used smokeless tobacco. He reports previous alcohol use. He reports that he does not use drugs.  Family History: family history is not on file.   HOME  MEDICATIONS: Allergies as of 02/08/2020   No Known Allergies     Medication List       Accurate as of February 08, 2020 11:54 AM. If you have any questions, ask your nurse or doctor.        aspirin 81 MG EC tablet Take 1 tablet (81 mg total) by mouth daily. Swallow whole.   dapagliflozin propanediol 10 MG Tabs tablet Commonly known as: Farxiga Take 1 tablet (10 mg total) by mouth daily before breakfast.   Entresto 97-103 MG Generic drug: sacubitril-valsartan Take 1 tablet by mouth 2 (two) times daily.   folic acid 1 MG tablet Commonly known as: FOLVITE Take 1 tablet (1 mg total) by mouth daily.   hydrocortisone 5 MG tablet Commonly known as: CORTEF Take 1 tablet (5 mg total) by mouth every evening.   hydrocortisone 10 MG tablet Commonly known as: CORTEF Take 1 tablet (10 mg total) by mouth in the morning.   isosorbide-hydrALAZINE 20-37.5 MG tablet Commonly known as: BIDIL Take 1 tablet by mouth 3 (three) times daily.   levothyroxine 50 MCG tablet Commonly known as: SYNTHROID Take 1 tablet (50 mcg total) by mouth daily.   MULTIPLE VITAMIN PO Take 1 tablet by mouth daily.   rosuvastatin 10 MG tablet  Commonly known as: CRESTOR Take 1 tablet (10 mg total) by mouth daily.   spironolactone 50 MG tablet Commonly known as: ALDACTONE Take 1 tablet (50 mg total) by mouth at bedtime.   torsemide 20 MG tablet Commonly known as: DEMADEX Take 1 tablet (20 mg total) by mouth daily.         REVIEW OF SYSTEMS: A comprehensive ROS was conducted with the patient and is negative except as per HPI    OBJECTIVE:  VS: BP 134/82   Pulse 82   Ht 5\' 4"  (1.626 m)   Wt 138 lb 8 oz (62.8 kg)   SpO2 97%   BMI 23.77 kg/m    Wt Readings from Last 3 Encounters:  02/08/20 138 lb 8 oz (62.8 kg)  02/02/20 146 lb 3.2 oz (66.3 kg)  12/28/19 135 lb (61.2 kg)   Body surface area is 1.68 meters squared.   EXAM: General: Pt appears well and is in NAD  Neck: General: Supple  without adenopathy. Thyroid: Thyroid size normal.  No goiter or nodules appreciated.   Lungs: Clear with good BS bilat with no rales, rhonchi, or wheezes  Heart: Auscultation: RRR.  Abdomen: Normoactive bowel sounds, soft, nontender, without masses or organomegaly palpable  Extremities:  BL LE: No pretibial edema normal ROM and strength.  Skin: Hair: Texture and amount normal with gender appropriate distribution Skin Inspection: No rashes Skin Palpation: Skin temperature, texture, and thickness normal to palpation  Neuro: Cranial nerves: II - XII grossly intact  Motor: Normal strength throughout DTRs: 2+ and symmetric in UE without delay in relaxation phase  Mental Status: Judgment, insight: Intact Orientation: Oriented to time, place, and person Mood and affect: No depression, anxiety, or agitation     DATA REVIEWED:   Results for Steven, Frazier (MRN [id number]) as of 02/09/2020 15:55  Ref. Range 02/08/2020 12:21  Prolactin Latest Ref Range: 2.0 - 18.0 ng/mL 8.2  TSH Latest Ref Range: 0.35 - 4.50 uIU/mL 0.87     Results for Steven, Frazier (MRN [id number]) as of 02/08/2020 12:05  Ref. Range 02/02/2020 15:21  Sodium Latest Ref Range: 135 - 145 mmol/L 139  Potassium Latest Ref Range: 3.5 - 5.1 mmol/L 4.7  Chloride Latest Ref Range: 98 - 111 mmol/L 108  CO2 Latest Ref Range: 22 - 32 mmol/L 22  Glucose Latest Ref Range: 70 - 99 mg/dL 98  BUN Latest Ref Range: 8 - 23 mg/dL 5 (L)  Creatinine Latest Ref Range: 0.61 - 1.24 mg/dL 0.90  Calcium Latest Ref Range: 8.9 - 10.3 mg/dL 8.6 (L)  Anion gap Latest Ref Range: 5 - 15  9  GFR, Estimated Latest Ref Range: >60 mL/min >60    CT Scan 11/14/2019   Cardiovascular: Atherosclerotic calcification of the aortic arch. Mild to moderate cardiomegaly.  Mediastinum/Nodes: Prevascular lymph node 0.9 cm in short axis on image 17/3. Right paratracheal lymph node 0.8 cm in short axis on image 18 of series 3. These lymph nodes are indistinct and may  be upper normal in size due to passive congestion. Similar scattered small indistinct axillary lymph nodes.  Lungs/Pleura: Small left and moderate right pleural effusion with passive atelectasis. Minimal scattered subsegmental atelectasis in the lungs.  Musculoskeletal: Diffuse subcutaneous edema.  Nonunited fractures of the ninth, tenth, and eleventh ribs posterolaterally with a partially healed fracture of the left twelfth rib posterolaterally.  CT ABDOMEN PELVIS FINDINGS  Hepatobiliary: Prominence of the lateral segment left hepatic lobe could be a sign of early cirrhosis. No  focal liver lesion is identified. Gallbladder unremarkable.  Pancreas: Unremarkable  Spleen: Unremarkable  Adrenals/Urinary Tract: 2.5 by 2.4 cm fluid density lesion exophytic from the left kidney upper pole favoring simple cyst. The adrenal glands appear unremarkable.  Stomach/Bowel: Unremarkable  Vascular/Lymphatic: No pathologic adenopathy.  Reproductive: Unremarkable  Other: Diffuse subcutaneous and mesenteric edema. Small amount of ascites along the paracolic gutters. Mild perihepatic and perisplenic ascites.  Musculoskeletal: Small left hydrocele along the spermatic cord. Disc bulges at L3-4, L4-5, and L5-S1.  IMPRESSION: 1. Diffuse subcutaneous and mesenteric edema with small amount of ascites along the paracolic gutters and mild perihepatic and perisplenic ascites. The appearance is compatible with third spacing of fluid and may be related to the patient's hypoalbuminemia and hypoproteinemia. 2. Mild to moderate cardiomegaly. 3. Small left and moderate right pleural effusions with passive atelectasis. 4. Prominence of the lateral segment left hepatic lobe could be a sign of early cirrhosis. 5. Nonunited fractures of the left ninth, tenth, and eleventh ribs posterolaterally with a partially healed fracture of the left twelfth rib posterolaterally. 6. Disc bulges at  L3-4, L4-5, and L5-S1. 7. Small left hydrocele along the spermatic cord. 8. Aortic atherosclerosis.  Old records , labs and images have been reviewed.   ASSESSMENT/PLAN/RECOMMENDATIONS:   1. Adrenal Insufficiency:   - Pt has been on hydrocortisone since 10/2019 with a low random serum cortisol of 0.4 ug/dL - I have advised the pt to obtain Medical alert bracelet - Recent BMP shows normal K and Na - Discussed sick day rule  - Will check ACTH    Medications HC 10 mg, 1 tablet with Breakfast  HC 5 mg, 1 tablet between 2-4 pm      2. Hypothyroidism:   - Pt is clinically euthyroid  - TSH today is normal    Medications : Continue Levothyroxine 50 mcg daily     F/U in 4 months   Signed electronically by: Lyndle Herrlich, MD  [facility]  [facility] [address] [place], [place] [address] Phone: [phone] FAX: [phone]   CC: Norm Salt, [place] [address] [place] [address] Phone: [phone] Fax: [phone]   Return to Endocrinology clinic as below: Future Appointments  Date Time Provider Department Center  02/09/2020 10:00 AM [facility] PA/NP [facility] None  02/18/2020 10:15 AM Teal, Bettey Mare, PT [facility]

## 2020-02-08 ENCOUNTER — Encounter: Payer: Self-pay | Admitting: Internal Medicine

## 2020-02-08 ENCOUNTER — Other Ambulatory Visit: Payer: Self-pay

## 2020-02-08 ENCOUNTER — Ambulatory Visit: Payer: Medicaid Other | Admitting: Internal Medicine

## 2020-02-08 VITALS — BP 134/82 | HR 82 | Ht 64.0 in | Wt 138.5 lb

## 2020-02-08 DIAGNOSIS — E2740 Unspecified adrenocortical insufficiency: Secondary | ICD-10-CM | POA: Insufficient documentation

## 2020-02-08 DIAGNOSIS — E039 Hypothyroidism, unspecified: Secondary | ICD-10-CM

## 2020-02-08 LAB — TSH: TSH: 0.87 u[IU]/mL (ref 0.35–4.50)

## 2020-02-08 NOTE — Progress Notes (Signed)
Advanced Heart Failure Clinic Note   Referring Physician: PCP: Trey Sailors, PA PCP-Cardiologist: Dr. Haroldine Laws   Reason for Visit: Chronic Systolic Heart Failure  HPI: Steven Frazier a 65 y.o[place]withsystolic HF (EF 42-35%), HTN, and hypothyroidism.   [facility] with hypervolemic hyponatremia in the setting of systolic HF with Na 361.Also noted to have unintentional significant weight loss(~20-25 pounds), low albumin, microcytic anemiaand eosinophilia. Work-up including peripheral smear, HIV, hepatitis and parasitic screens negative. During the admission, the patient was diuresed with lasix 2m IV BID with significant improvement in LE edema and Na. He was discharged home on lasix 2863mdaily and lisinopril 63m33mith plans for follow-up in Cardiology clinic for further management.  Admitted again 10/21 with marked volume overload. [facility]%, with normal coronaries and mildly elevated filling pressures w/ normal cardiac output. Diuresed with IV lasix and transitioned to torsemide. Medications adjusted to aim for GDMT.  Also recently found to have adrenal insuffiencey. Cortisol level checked recent admit and found to be markedly low at 0.4. D/w endocrinology who recommended hydrocortisone 10 mg qam/ 5 mg pm + brain MRI w/ pituitary protocol. MRI was unremarkable. He has been referred to outpatient endocrinology for further management.   Today he returns for HF follow up with his daughter. He was seen the HF clinic last week and was volume overloaded so spiro was increased to 50 mg daily and torsemide 20 mg was restarted. Overall feeling much better. Able to walk around the store without using a cane.  Denies SOB/PND/Orthopnea. Appetite ok. No fever or chills. Weight at home down from 140-->135    pounds. Taking all medications.  Cardiac Studies  Echo 8/21: EF 40%. RV mildly reduced    R/LHC 10/21:  Ao = 123/72 (94) LV = 122/15 RA = 9 RV = 48/12 PA = 49/17 (31) PCW = 22 (v = 31) Fick cardiac output/index = 4.1/2.4 PVR = 2.2 WU  FA sat = 99% PA sat = 68%, 69% PAPi = 3.5 Assessment: 1. Severe NICM EF 20% 2. Normal coronaries 3. Mildly elevated filling pressures with normal CO  cMRI 10/21 IMPRESSION: 1. Diffuse subcutaneous and mesenteric edema with small amount of ascites along the paracolic gutters and mild perihepatic and perisplenic ascites. The appearance is compatible with third spacing of fluid and may be related to the patient's hypoalbuminemia and hypoproteinemia. 2. Mild to moderate cardiomegaly. 3. Small left and moderate right pleural effusions with passive atelectasis. 4. Prominence of the lateral segment left hepatic lobe could be a sign of early cirrhosis. 5. Nonunited fractures of the left ninth, tenth, and eleventh ribs posterolaterally with a partially healed fracture of the left twelfth rib posterolaterally. 6. Disc bulges at L3-4, L4-5, and L5-S1. 7. Small left hydrocele along the spermatic cord. 8. Aortic atherosclerosis.  Review of systems complete and found to be negative unless listed in HPI.     Past Medical History:  Diagnosis Date  . [place] ([place] . Hypertension   . Hyponatremia   . Hypothyroidism   . Shoulder pain     Current Outpatient Medications  Medication Sig Dispense Refill  . aspirin EC 81 MG EC tablet Take 1 tablet (81 mg total) by mouth daily. Swallow whole. 30 tablet 11  . dapagliflozin propanediol (FARXIGA) 10 MG TABS tablet Take 1 tablet (10 mg total) by mouth daily before breakfast. 30 tablet 3  . folic acid (FOLVITE) 1 MG tablet  Take 1 tablet (1 mg total) by mouth daily. 30 tablet 0  . hydrocortisone (CORTEF) 10 MG tablet Take 1 tablet (10 mg total) by mouth in the morning. 30 tablet 6  . hydrocortisone (CORTEF) 5 MG tablet Take 1 tablet (5 mg total) by mouth every evening. 30 tablet 6  .  isosorbide-hydrALAZINE (BIDIL) 20-37.5 MG tablet Take 1 tablet by mouth 3 (three) times daily. 90 tablet 11  . levothyroxine (SYNTHROID) 50 MCG tablet Take 1 tablet (50 mcg total) by mouth daily. 30 tablet 6  . MULTIPLE VITAMIN PO Take 1 tablet by mouth daily.    . rosuvastatin (CRESTOR) 10 MG tablet Take 1 tablet (10 mg total) by mouth daily. 30 tablet 6  . sacubitril-valsartan (ENTRESTO) 97-103 MG Take 1 tablet by mouth 2 (two) times daily.    Marland Kitchen spironolactone (ALDACTONE) 50 MG tablet Take 1 tablet (50 mg total) by mouth at bedtime. 30 tablet 6  . torsemide (DEMADEX) 20 MG tablet Take 1 tablet (20 mg total) by mouth daily. 30 tablet 6   No current facility-administered medications for this encounter.    No Known Allergies    Social History   Socioeconomic History  . Marital status: Married    Spouse name: Not on file  . Number of children: Not on file  . Years of education: Not on file  . Highest education level: Not on file  Occupational History  . Not on file  Tobacco Use  . Smoking status: Never Smoker  . Smokeless tobacco: Never Used  Substance and Sexual Activity  . Alcohol use: Not Currently  . Drug use: Never  . Sexual activity: Not on file  Other Topics Concern  . Not on file  Social History Narrative  . Not on file   Social Determinants of Health   Financial Resource Strain: Not on file  Food Insecurity: No Food Insecurity  . Worried About Charity fundraiser in the Last Year: Never true  . Ran Out of Food in the Last Year: Never true  Transportation Needs: No Transportation Needs  . Lack of Transportation (Medical): No  . Lack of Transportation (Non-Medical): No  Physical Activity: Not on file  Stress: Not on file  Social Connections: Not on file  Intimate Partner Violence: Not on file     No family history on file.  Vitals:   02/09/20 1034  BP: 120/70  Pulse: 88  SpO2: 95%  Weight: 62.8 kg (138 lb 6.4 oz)   Wt Readings from Last 3 Encounters:   02/09/20 62.8 kg (138 lb 6.4 oz)  02/08/20 62.8 kg (138 lb 8 oz)  02/02/20 66.3 kg (146 lb 3.2 oz)     PHYSICAL EXAM: General:  Walked in the clinic with a cane. Thin.  No resp difficulty HEENT: normal Neck: supple. no JVD. Carotids 2+ bilat; no bruits. No lymphadenopathy or thryomegaly appreciated. Cor: PMI nondisplaced. Regular rate & rhythm. No rubs, gallops or murmurs. Lungs: clear Abdomen: soft, nontender, nondistended. No hepatosplenomegaly. No bruits or masses. Good bowel sounds. Extremities: no cyanosis, clubbing, rash, edema Neuro: alert & orientedx3, cranial nerves grossly intact. moves all 4 extremities w/o difficulty. Affect pleasant    ASSESSMENT & PLAN:  1.  Chronic Systolic Heart Failure - Echo 8/21 showed EF 40%. RV mildly reduced  - [facility] 10/21 showed severe NICM EF 20%, with normal coronaries and mildly elevated filling pressures w/ normal cardiac output. - cMRI suggestive of previous myocarditis vs infiltrative. EF 37% - PYP  visual and quantitative assessment (grade 1 and H/CL = 1.18) are equivocal for transthyretin amyloidosis => probably not TTR amyloidosis. - auto-immune serologies negative. ESR 5, SPEP/serum light chains negative - ? secondary adrenocortical insufficiency as etiology of HF. Serum cortisol level was markedly low at 0.4 (10/21)>>now on hydrocortisone 10 mg qam/ 5 mg pm. Brain MRI unrevealing - Reds Clip 29%.   NYHA II. Functional improvement. Volume status stable. Continue  torsemide 20 mg daily.  -Continue Spiro to 50 mg daily   - Continue Farxiga 10 mg daily  - Continue Entresto 97-103 mg bid.    - Continue Bidil 1 tablet tid   - avoiding  blocker w/ marked 1st degree AVB Check BMET today  - Repeat ECHO next visit.   2. Adrenal Insuffiencey  - Serum Cortisol markedly low 0.4 10/21  - brain MRI w/ pituitary protocol unremarkable.  C/w hydrocortisone 10 mg qam/ 5 mg pm,   He has been referred to Endocrinology.  3. HTN -  Stable Continue current regimen.   4. Visual Impairment - ? Cataracts - refer to ophthalmology  Follow up in 4 weeks with Dr Haroldine Laws and an ECHO. Greater than 50% of the (total minutes 25) visit spent in counseling/coordination of care regarding the above plan.    Darrick Grinder, NP 02/09/20

## 2020-02-08 NOTE — Patient Instructions (Signed)
-   Please obtain a medical alert bracelet ( for adrenal insufficiency) - Continue Hydrocortisone 10 mg, 1 tablet  with Breakfast  - Continue Hydrocortisone 5 mg, 1 tablet between 2 and 4 pm  - Continue levothyroxine 50 mcg daily        - ADRENAL INSUFFICIENCY SICK DAY RULES:  Should you face an extreme emotional or physical stress such as trauma, surgery or acute illness, this will require extra steroid coverage so that the body can meet that stress.   Without increasing the steroid dose you may experience severe weakness, headache, dizziness, nausea and vomiting and possibly a more serious deterioration in health.  Typically the dose of steroids will only need to be increased for a couple of days if you have an illness that is transient and managed in the community.   If you are unable to take/absorb an increased dose of steroids orally because of vomiting or diarrhea, you will urgently require steroid injections and should present to an Emergency Department.  The general advice for any serious illness is as follows: 1. Double the normal daily steroid dose for up to 3 days if you have a temperature of more than 37.50C (99.57F) with signs of sickness, or severe emotional or physical distress 2. Contact your primary care doctor and Endocrinologist if the illness worsens or it lasts for more than 3 days.  3. In cases of severe illness, urgent medical assistance should be promptly sought. 4. If you experience vomiting/diarrhea or are unable to take steroids by mouth, please present to emergency room

## 2020-02-09 ENCOUNTER — Encounter (HOSPITAL_COMMUNITY): Payer: Self-pay

## 2020-02-09 ENCOUNTER — Ambulatory Visit (HOSPITAL_COMMUNITY)
Admission: RE | Admit: 2020-02-09 | Discharge: 2020-02-09 | Disposition: A | Discharge status: Home / Self Care | Payer: Medicaid Other | Source: Ambulatory Visit | Attending: Adult Health | Admitting: Adult Health

## 2020-02-09 ENCOUNTER — Encounter: Payer: Self-pay | Admitting: Internal Medicine

## 2020-02-09 VITALS — BP 120/70 | HR 88 | Wt 138.4 lb

## 2020-02-09 DIAGNOSIS — Z79899 Other long term (current) drug therapy: Secondary | ICD-10-CM | POA: Insufficient documentation

## 2020-02-09 DIAGNOSIS — R188 Other ascites: Secondary | ICD-10-CM | POA: Diagnosis not present

## 2020-02-09 DIAGNOSIS — I10 Essential (primary) hypertension: Secondary | ICD-10-CM

## 2020-02-09 DIAGNOSIS — Z7984 Long term (current) use of oral hypoglycemic drugs: Secondary | ICD-10-CM | POA: Diagnosis not present

## 2020-02-09 DIAGNOSIS — I700 Atherosclerosis of aorta: Secondary | ICD-10-CM | POA: Diagnosis not present

## 2020-02-09 DIAGNOSIS — Z7982 Long term (current) use of aspirin: Secondary | ICD-10-CM | POA: Diagnosis not present

## 2020-02-09 DIAGNOSIS — J90 Pleural effusion, not elsewhere classified: Secondary | ICD-10-CM | POA: Insufficient documentation

## 2020-02-09 DIAGNOSIS — I428 Other cardiomyopathies: Secondary | ICD-10-CM | POA: Insufficient documentation

## 2020-02-09 DIAGNOSIS — E2740 Unspecified adrenocortical insufficiency: Secondary | ICD-10-CM | POA: Insufficient documentation

## 2020-02-09 DIAGNOSIS — I110 Hypertensive heart disease with heart failure: Secondary | ICD-10-CM | POA: Diagnosis not present

## 2020-02-09 DIAGNOSIS — I5022 Chronic systolic (congestive) heart failure: Secondary | ICD-10-CM | POA: Diagnosis present

## 2020-02-09 DIAGNOSIS — E8809 Other disorders of plasma-protein metabolism, not elsewhere classified: Secondary | ICD-10-CM | POA: Insufficient documentation

## 2020-02-09 LAB — BASIC METABOLIC PANEL
Anion gap: 10 (ref 5–15)
BUN: 12 mg/dL (ref 8–23)
CO2: 28 mmol/L (ref 22–32)
Calcium: 8.5 mg/dL — ABNORMAL LOW (ref 8.9–10.3)
Chloride: 96 mmol/L — ABNORMAL LOW (ref 98–111)
Creatinine, Ser: 1.02 mg/dL (ref 0.61–1.24)
GFR, Estimated: >60 mL/min (ref >60)
Glucose, Bld: 125 mg/dL — ABNORMAL HIGH (ref 70–99)
Potassium: 3.6 mmol/L (ref 3.5–5.1)
Sodium: 134 mmol/L — ABNORMAL LOW (ref 135–145)

## 2020-02-09 MED ORDER — HYDROCORTISONE 5 MG PO TABS
5.0000 mg | ORAL_TABLET | Freq: Every evening | ORAL | 3 refills | Status: DC
Start: 2020-02-09 — End: 2020-12-02

## 2020-02-09 MED ORDER — HYDROCORTISONE 10 MG PO TABS
10.0000 mg | ORAL_TABLET | Freq: Every day | ORAL | 3 refills | Status: DC
Start: 2020-02-09 — End: 2020-12-02

## 2020-02-09 MED ORDER — LEVOTHYROXINE SODIUM 50 MCG PO TABS
50.0000 ug | ORAL_TABLET | Freq: Every day | ORAL | 3 refills | Status: DC
Start: 2020-02-09 — End: 2020-11-23

## 2020-02-09 NOTE — Patient Instructions (Signed)
It was great to see you today! No medication changes are needed at this time.  Labs today We will only contact you if something comes back abnormal or we need to make some changes. Otherwise no news is good news!  Your physician recommends that you schedule a follow-up appointment in: 4-6 weeks with Dr Gala Romney and echo  Your physician has requested that you have an echocardiogram. Echocardiography is a painless test that uses sound waves to create images of your heart. It provides your doctor with information about the size and shape of your heart and how well your heart's chambers and valves are working. This procedure takes approximately one hour. There are no restrictions for this procedure.   If you have any questions or concerns before your next appointment please send [place] a message through [place] or call our office at [phone].    TO LEAVE A MESSAGE FOR THE NURSE SELECT OPTION 2, PLEASE LEAVE A MESSAGE INCLUDING: . YOUR NAME . DATE OF BIRTH . CALL BACK NUMBER . REASON FOR CALL**this is important as we prioritize the call backs  YOU WILL RECEIVE A CALL BACK THE SAME DAY AS LONG AS YOU CALL BEFORE 4:00 PM  It was great to see you today! No medication changes are needed at this time.

## 2020-02-12 LAB — ACTH: C206 ACTH: <5 pg/mL — ABNORMAL LOW (ref 6–50)

## 2020-02-12 LAB — PROLACTIN: Prolactin: 8.2 ng/mL (ref 2.0–18.0)

## 2020-02-18 ENCOUNTER — Encounter: Payer: Self-pay | Admitting: Physical Therapy

## 2020-02-18 ENCOUNTER — Other Ambulatory Visit: Payer: Self-pay

## 2020-02-18 ENCOUNTER — Ambulatory Visit: Payer: Medicaid Other | Attending: Orthopedic Surgery | Admitting: Physical Therapy

## 2020-02-18 DIAGNOSIS — M25561 Pain in right knee: Secondary | ICD-10-CM | POA: Insufficient documentation

## 2020-02-18 DIAGNOSIS — G8929 Other chronic pain: Secondary | ICD-10-CM | POA: Diagnosis present

## 2020-02-18 DIAGNOSIS — M25551 Pain in right hip: Secondary | ICD-10-CM | POA: Insufficient documentation

## 2020-02-18 DIAGNOSIS — M7061 Trochanteric bursitis, right hip: Secondary | ICD-10-CM

## 2020-02-18 DIAGNOSIS — M25562 Pain in left knee: Secondary | ICD-10-CM | POA: Insufficient documentation

## 2020-02-18 NOTE — Patient Instructions (Signed)
Access Code: [email or web address]: [email or web address]: 01/20/2022Prepared by: Ezekiel Ina Notes  Reps are just suggestions. Do as many as he can without pain but don't over-do it  Ice the right and left knee at home. Use a frozen bag of peas or corn if no ice pack is available. Make sure something is in-between the skin and the ice.  Exercises  Supine Bridge - 2 x daily - 7 x weekly - 2 sets - 10 reps  Supine Active Straight Leg Raise - 2 x daily - 7 x weekly - 2 sets - 10 reps  Supine Piriformis Stretch with Foot on Ground - 2 x daily - 7 x weekly - 1 sets - 3 reps - 20 hold

## 2020-02-18 NOTE — Therapy (Addendum)
[facility] [address] [place], [place], [address] Phone: [phone]   Fax:  [phone]  Physical Therapy Evaluation  Patient Details  Name: Laquinton Toole MRN: [id number] Date of Birth: [birth year] Referring Provider (PT): Dr Renaye Rakers   Encounter Date: 02/18/2020   PT End of Session - 02/18/20 1027    Visit Number 1    Number of Visits 12    Date for PT Re-Evaluation 03/31/20    Authorization Type [employer]    PT Start Time 1100    PT Stop Time 1143    PT Time Calculation (min) 43 min    Activity Tolerance Patient tolerated treatment well    Behavior During Therapy [facility] for tasks assessed/performed           Past Medical History:  Diagnosis Date  . Heart failure (HCC)   . Hypertension   . Hyponatremia   . Hypothyroidism   . Shoulder pain     Past Surgical History:  Procedure Laterality Date  . HERNIA REPAIR    . NO PAST SURGERIES    . RIGHT/LEFT HEART CATH AND CORONARY ANGIOGRAPHY N/A 11/16/2019   Procedure: RIGHT/LEFT HEART CATH AND CORONARY ANGIOGRAPHY;  Surgeon: Dolores Patty, MD;  Location: [facility];  Service: Cardiovascular;  Laterality: N/A;    There were no vitals filed for this visit.    Subjective Assessment - 02/18/20 1023    Subjective Patient began having right hip pain in December. The pain can go down his lateral leg and into his knee. He does nto rememeber a mechanism of injury. he had no paist hidstory of pain in the hip. He is using a cane because of the pain. He is now having pain in his right knee as well.    Pertinent History heart failure    Limitations Standing;Walking    How long can you sit comfortably? no pain    How long can you stand comfortably? pain with increased standing    How long can you walk comfortably? limited distances    Diagnostic tests did not recall    Patient Stated Goals to have less pain    Currently in Pain? Yes    Pain Score 4     reports he has not had pain in his hip. It was unclear how long he has not had pain in his hip.   Pain Location Knee    Pain Orientation Right    Pain Descriptors / Indicators Aching    Pain Type Chronic pain    Pain Onset More than a month ago    Pain Frequency Constant    Aggravating Factors  standing and walking    Pain Relieving Factors rest    Multiple Pain Sites No              [facility] PT Assessment - 02/18/20 0001      Assessment   Medical Diagnosis Left Trochanteric brursitis    Referring Provider (PT) Dr Renaye Rakers    Onset Date/Surgical Date --   December 2021   Hand Dominance Right    Next MD Visit Thinks he has an appointment at some point    Prior Therapy None      Precautions   Precautions None      Restrictions   Weight Bearing Restrictions No      Balance Screen   Has the patient fallen in the past 6 months No    Has the  patient had a decrease in activity level because of a fear of falling?  No    Is the patient reluctant to leave their home because of a fear of falling?  No      Home Environment   Additional Comments No steps into the house      Prior Function   Level of Independence Independent    Vocation Retired    Leisure Liked to walk before pain started      [employer]   Overall Cognitive Status Within Functional Limits for tasks assessed    Attention Focused    Focused Attention Appears intact    Memory Appears intact    Awareness Appears intact    Problem Solving Appears intact      Observation/Other Assessments   Focus on Therapeutic Outcomes (FOTO)  Medicaid      Sensation   Light Touch Appears Intact    Additional Comments denies parathesias      Coordination   Gross Motor Movements are Fluid and Coordinated Yes    Fine Motor Movements are Fluid and Coordinated Yes      ROM / Strength   AROM / PROM / Strength AROM;PROM;Strength      AROM   Overall AROM Comments nild pain with end range right hop flexion    AROM Assessment  Site Hip    Right/Left Hip Right;Left      PROM   Overall PROM Comments left LE 5/5    PROM Assessment Site Hip;Knee    Right/Left Hip Right;Left    Right/Left Knee Right;Left      Strength   Strength Assessment Site Knee;Hip    Right/Left Hip Right    Right Hip Flexion 4/5    Right Hip ABduction 4+/5    Right Hip ADduction 4+/5    Right/Left Knee Right    Right Knee Flexion 4/5    Right Knee Extension 4/5      Palpation   Palpation comment no tenderness to palpation in her hip; pain in his anterior right knee;      Ambulation/Gait   Gait Comments lateral movement with gait;  decreased right single leg stance time;                      Objective measurements completed on examination: See above findings.       [facility] Adult PT Treatment/Exercise - 02/18/20 0001      Exercises   Exercises Knee/Hip      Knee/Hip Exercises: Stretches   Other Knee/Hip Stretches piriformis stretch 3x20 sec hold      Knee/Hip Exercises: Supine   Other Supine Knee/Hip Exercises SLR 2x10; bridging x10                    PT Short Term Goals - 02/18/20 1128      PT SHORT TERM GOAL #1   Title Patient will demonstrate 5/5 gross right LE strength    Baseline 4/5 right hip flexion 4+/5 right knee extension    Time 3    Period Weeks    Status New    Target Date 03/10/20      PT SHORT TERM GOAL #2   Title Patient will ambualte 500' without device without pain    Baseline: using a cane with pain  Time 3    Period Weeks    Status New    Target Date 03/10/20      PT SHORT TERM GOAL #3  Title Patient will beindepdnent with basic HEP    Baseline No HEP    Time 3    Period Weeks    Status New    Target Date 03/10/20             PT Long Term Goals - 02/18/20 1131      PT LONG TERM GOAL #1   Title Patient will stand for 1 hour without increased pain in knees or hips    Baseline: 5-10 minutes before pain  Time 6    Period Weeks    Status New    Target  Date 03/31/20      PT LONG TERM GOAL #2   Title Patient will ambualte around grocery sotre without self report of pain or assitvie device.    Baseline  Pain with minimal walking    Time 6    Period Weeks    Status New    Target Date 03/31/20                  Plan - 02/18/20 1043    Clinical Impression Statement Patient is a 65 year old amle who presents with right hip pain. The pain at this time appears to be in his knee's. He has focal swelling in his right anterior knee. He had no trigger points in his hips today or lumbar spine. He had tenderness to palpation in his anterior knee. His intial decription of his pain seemed like radicular back pain. We will treat him with core and hip stabilization exercises but alo be aware of the knees without our treatment. He was using his cane on the wrong side. we corrected so hie is shifting weight off the right side instead of onto that side. He would benefit from skilled therapy t imporve his and knee stability, and improve gait.    Personal Factors and Comorbidities Comorbidity 1    Comorbidities remote hsitroy of shoulder pain    Examination-Activity Limitations Bend;Squat;Stairs;Stand    Examination-Participation Restrictions Cleaning;Community Activity;Shop    Stability/Clinical Decision Making Evolving/Moderate complexity   Pain is changing areas and intensitys   Clinical Decision Making Moderate    Rehab Potential Good    PT Frequency 1x / week    PT Duration 6 weeks    PT Treatment/Interventions ADLs/Self Care Home Management;Electrical Stimulation;Cryotherapy;Iontophoresis 4mg /ml Dexamethasone;Ultrasound;Gait training;DME Instruction;Functional mobility training;Therapeutic activities;Traction;Therapeutic exercise;Neuromuscular re-education;Stair training;Patient/family education;Manual techniques;Passive range of motion;Dry needling;Taping    PT Next Visit Plan continue with bialteral hip and knee stabilization activity. Consdier  an intepreter although could answer all questions. Continue with stabilization. review HEP. Patient nededed significant cuing for technique; progress to standing exercises when able; add sidelying hip flexion; Add clamshell;    PT Home Exercise Plan SLR; Bridging, prifromis stretch;Access Code: [email or web address]  URL: [email or web address]  Date: 02/18/2020  Prepared by: 02/20/2020    Program Notes      Reps are just suggestions. Do as many as he can without pain but don't over-do it          Ice the right and left knee at home. Use a frozen bag of peas  or corn if no ice pack is available. Make sure something is in-between the skin and the ice.     Exercises  .Supine Bridge - 2 x daily - 7 x weekly - 2 sets - 10 reps  .Supine Active Straight Leg Raise - 2 x daily - 7 x weekly - 2 sets - 10 reps  .Supine Piriformis  Stretch with Foot on Ground - 2 x daily - 7 x weekly - 1 sets - 3 reps - 20 hold    Consulted and Agree with Plan of Care Patient           Patient will benefit from skilled therapeutic intervention in order to improve the following deficits and impairments:  Abnormal gait,Decreased range of motion,Increased fascial restricitons,Pain,Decreased activity tolerance,Decreased endurance,Decreased safety awareness,Decreased strength,Decreased mobility  Visit Diagnosis: Pain in right hip  Chronic pain of right knee  Chronic pain of left knee  Greater trochanteric bursitis of right hip   Check all possible CPT codes: 22025- Therapeutic Exercise, [phone]- Neuro Re-education, [phone] - Gait Training, 97140 - Manual Therapy, 97530 - Therapeutic Activities, 97535 - Self Care, [phone] - Mechanical traction, 97014 - Electrical stimulation (unattended), [id number] - Electrical stimulation (Manual), [id number] - Iontophoresis and [id number] - Ultrasound          Problem List Patient Active Problem List   Diagnosis Date Noted  . Adrenal insufficiency (HCC) 02/08/2020  . Acquired hypothyroidism 02/08/2020   . Malnutrition of moderate degree 11/18/2019  . Acute on chronic heart failure (HCC) 11/13/2019  . Acute systolic CHF (congestive heart failure) (HCC) 09/04/2019  . Microcytic anemia 09/04/2019  . Anasarca 09/04/2019  . Hyponatremia 09/03/2019    Dessie Coma PT DPT  02/18/2020, 1:27 PM  [facility] [address] [place], [place], [address] Phone: [phone]   Fax:  [phone]  Name: Steven Frazier MRN: [id number] Date of Birth: 04-19-1955

## 2020-02-19 MED FILL — ROSUVASTATIN CALCIUM 10 MG: 10 | 30 days supply | Qty: 30 | Fill #2

## 2020-02-22 ENCOUNTER — Other Ambulatory Visit (HOSPITAL_COMMUNITY): Payer: Self-pay | Admitting: *Deleted

## 2020-02-22 MED ORDER — DAPAGLIFLOZIN PROPANEDIOL 10 MG PO TABS
10.0000 mg | ORAL_TABLET | Freq: Every day | ORAL | 3 refills | Status: DC
Start: 2020-02-22 — End: 2020-06-22

## 2020-03-03 ENCOUNTER — Other Ambulatory Visit: Payer: Self-pay

## 2020-03-03 ENCOUNTER — Ambulatory Visit: Payer: Medicaid Other | Attending: Orthopedic Surgery | Admitting: Physical Therapy

## 2020-03-03 DIAGNOSIS — M7061 Trochanteric bursitis, right hip: Secondary | ICD-10-CM | POA: Diagnosis present

## 2020-03-03 DIAGNOSIS — M25562 Pain in left knee: Secondary | ICD-10-CM | POA: Insufficient documentation

## 2020-03-03 DIAGNOSIS — G8929 Other chronic pain: Secondary | ICD-10-CM | POA: Insufficient documentation

## 2020-03-03 DIAGNOSIS — M25551 Pain in right hip: Secondary | ICD-10-CM | POA: Diagnosis present

## 2020-03-03 DIAGNOSIS — M25561 Pain in right knee: Secondary | ICD-10-CM | POA: Diagnosis present

## 2020-03-03 NOTE — Therapy (Signed)
[facility], [place], [address] Phone: [phone]   Fax:  [phone]  Physical Therapy Treatment  Patient Details  Name: Steven Frazier MRN: [id number] Date of Birth: Jun 02, 1955 Referring Provider (PT): Dr Fredonia Highland   Encounter Date: 03/03/2020   PT End of Session - 03/03/20 0804    Visit Number 2    Number of Visits 12    Date for PT Re-Evaluation 03/31/20    Authorization Type [employer] -    Authorization Time Period pending auth.    PT Start Time 0805   pt arrived late   PT Stop Time 0843    PT Time Calculation (min) 38 min    Activity Tolerance Patient tolerated treatment well           Past Medical History:  Diagnosis Date  . Heart failure ([place])   . Hypertension   . Hyponatremia   . Hypothyroidism   . Shoulder pain     Past Surgical History:  Procedure Laterality Date  . HERNIA REPAIR    . NO PAST SURGERIES    . RIGHT/LEFT HEART CATH AND CORONARY ANGIOGRAPHY N/A 11/16/2019   Procedure: RIGHT/LEFT HEART CATH AND CORONARY ANGIOGRAPHY;  Surgeon: Jolaine Artist, MD;  Location: [place] [facility];  Service: Cardiovascular;  Laterality: N/A;    There were no vitals filed for this visit.   Subjective Assessment - 03/03/20 0805    Subjective "since the last session, I have been doing my exercise at home. the hip/knee  is feeling better. my pain is more in the back today than my knee and hip."    Currently in Pain? Yes    Pain Score 4    at worst 7/10, used faces scale   Pain Orientation Right    Pain Descriptors / Indicators Aching    Pain Type Chronic pain    Pain Onset More than a month ago    Pain Frequency Intermittent    Aggravating Factors  standing/ walking    Pain Relieving Factors rest/ sitting              [facility] PT Assessment - 03/03/20 0001      Assessment   Medical Diagnosis Left Trochanteric brursitis    Referring Provider (PT) Dr Fredonia Highland                          [facility] Adult PT Treatment/Exercise - 03/03/20 0001      Lumbar Exercises: Stretches   Lower Trunk Rotation Limitations 1 x 20    Active Hamstring Stretch Right;4 reps;30 seconds   PNF contract/ relax     Knee/Hip Exercises: Stretches   Other Knee/Hip Stretches piriformis stretch 2x20 sec hold      Knee/Hip Exercises: Supine   Straight Leg Raises Strengthening;2 sets;Both   12 reps   Other Supine Knee/Hip Exercises clam shell 2 x 20 with green theraband    Other Supine Knee/Hip Exercises bent knee raises 2 x 20 alternating L/R      Manual Therapy   Manual Therapy Joint mobilization;Muscle Energy Technique;Soft tissue mobilization    Joint Mobilization grade III LAD RLE only    Soft tissue mobilization IASTM and XF techniques for R patellar tendon    Muscle Energy Technique scissor technique - resisted R hip flexion, and L hip extension  PT Education - 03/03/20 [phone]    Education Details Reviewed HEP and updated today for hamstring stretching and LTR    Person(s) Educated Patient    Methods Explanation;Verbal cues;Handout    Comprehension Verbalized understanding;Verbal cues required            PT Short Term Goals - 02/18/20 1128      PT SHORT TERM GOAL #1   Title Patient will demonstrate 5/5 gross right LE strength    Baseline 4/5 right hip flexion 4+/5 right knee extension    Time 3    Period Weeks    Status New    Target Date 03/10/20      PT SHORT TERM GOAL #2   Title Patient will ambualte 500' without device without pain    Baseline using a cane with increased pain    Time 3    Period Weeks    Status New    Target Date 03/10/20      PT SHORT TERM GOAL #3   Title Patient will beindepdnent with basic HEP    Baseline No HEP    Time 3    Period Weeks    Status New    Target Date 03/10/20             PT Long Term Goals - 02/18/20 1131      PT LONG TERM GOAL #1   Title Patient will stand for 1 hour  without increased pain in knees or hips    Baseline can stand 5-10 minutes before onset of pain    Time 6    Period Weeks    Status New    Target Date 03/31/20      PT LONG TERM GOAL #2   Title Patient will ambualte around grocery sotre without self report of pain or assitvie device.    Baseline pain with minimal ambualtion at this time    Time 6    Period Weeks    Status New    Target Date 03/31/20                 Plan - 03/03/20 9038    Clinical Impression Statement pt arrives noting consistency with his HEP and some relief in the hip / knee stating it is more aggrivated in the back today. pt indicates pain is located at [employer] area and responded well to SIJ treatment addressing potential posterior rotation with hamstring stretching and R hip flexor MET. continued working glute med strengthening which he reported no pain but indicated feeling weak in the knee. end of session he reported no increase in pain.    PT Treatment/Interventions ADLs/Self Care Home Management;Electrical Stimulation;Cryotherapy;Iontophoresis 37m/ml Dexamethasone;Ultrasound;Gait training;DME Instruction;Functional mobility training;Therapeutic activities;Traction;Therapeutic exercise;Neuromuscular re-education;Stair training;Patient/family education;Manual techniques;Passive range of motion;Dry needling;Taping    PT Next Visit Plan continue with bialteral hip and knee stabilization activity. consider an intepreter although could answer all questions. Continue with stabilization. review HEP. Requires increased verbal/tactile cuing for technique; progress to standing exercises when able; add sidelying hip flexion; Add clamshell;  ptoential SIJ involvement    PT Home Exercise Plan SLR; Bridging, prifromis stretch;Access Code: [email or web address] URL: [email or web address]  Date: 02/18/2020  Prepared by: DCarolyne Littles   Program Notes      Reps are just suggestions. Do as many as he can without pain but don't  over-do it          Ice the right and left knee at home. Use a frozen bag of  peas  or corn if no ice pack is available. Make sure something is in-between the skin and the ice.     Exercises  .Supine Bridge - 2 x daily - 7 x weekly - 2 sets - 10 reps  .Supine Active Straight Leg Raise - 2 x daily - 7 x weekly - 2 sets - 10 reps  .Supine Piriformis Stretch with Foot on Ground - 2 x daily - 7 x weekly - 1 sets - 3 reps - 20 hold    Consulted and Agree with Plan of Care Patient           Patient will benefit from skilled therapeutic intervention in order to improve the following deficits and impairments:  Abnormal gait,Decreased range of motion,Increased fascial restricitons,Pain,Decreased activity tolerance,Decreased endurance,Decreased safety awareness,Decreased strength,Decreased mobility  Visit Diagnosis: Pain in right hip  Chronic pain of right knee  Chronic pain of left knee  Greater trochanteric bursitis of right hip     Problem List Patient Active Problem List   Diagnosis Date Noted  . Adrenal insufficiency ([place]) 02/08/2020  . Acquired hypothyroidism 02/08/2020  . Malnutrition of moderate degree 11/18/2019  . Acute on chronic heart failure ([place]) 11/13/2019  . Acute systolic CHF (congestive heart failure) ([place]) 09/04/2019  . Microcytic anemia 09/04/2019  . Anasarca 09/04/2019  . Hyponatremia 09/03/2019   Starr Lake PT, DPT, LAT, ATC  03/03/20  8:44 AM      [facility] [address] [place], [place], [address] Phone: [phone]   Fax:  [phone]  Name: Steven Frazier MRN: [id number] Date of Birth: 1955/11/13

## 2020-03-09 MED FILL — BIDIL 20-37.5 MG TABS: 20-37.5 | 30 days supply | Qty: 90 | Fill #2

## 2020-03-10 ENCOUNTER — Ambulatory Visit: Payer: Medicaid Other | Admitting: Physical Therapy

## 2020-03-10 ENCOUNTER — Other Ambulatory Visit: Payer: Self-pay

## 2020-03-10 ENCOUNTER — Encounter: Payer: Self-pay | Admitting: Physical Therapy

## 2020-03-10 DIAGNOSIS — M25551 Pain in right hip: Secondary | ICD-10-CM

## 2020-03-10 DIAGNOSIS — G8929 Other chronic pain: Secondary | ICD-10-CM

## 2020-03-10 DIAGNOSIS — M7061 Trochanteric bursitis, right hip: Secondary | ICD-10-CM

## 2020-03-10 DIAGNOSIS — M25561 Pain in right knee: Secondary | ICD-10-CM

## 2020-03-10 NOTE — Therapy (Signed)
[facility] Outpatient Rehabilitation [facility] [address] [place], [place], [address] Phone: [phone]   Fax:  [phone]  Physical Therapy Treatment  Patient Details  Name: Steven Frazier MRN: [id number] Date of Birth: 03-Mar-1955 Referring Provider (PT): Dr Renaye Rakers   Encounter Date: 03/10/2020   PT End of Session - 03/10/20 1629    Visit Number 3    Number of Visits 12    Date for PT Re-Evaluation 03/31/20    Authorization Type [employer] -    Authorization Time Period pending auth.    PT Start Time 0800    PT Stop Time 0840    PT Time Calculation (min) 40 min    Activity Tolerance Patient tolerated treatment well    Behavior During Therapy WFL for tasks assessed/performed           Past Medical History:  Diagnosis Date  . Heart failure (HCC)   . Hypertension   . Hyponatremia   . Hypothyroidism   . Shoulder pain     Past Surgical History:  Procedure Laterality Date  . HERNIA REPAIR    . NO PAST SURGERIES    . RIGHT/LEFT HEART CATH AND CORONARY ANGIOGRAPHY N/A 11/16/2019   Procedure: RIGHT/LEFT HEART CATH AND CORONARY ANGIOGRAPHY;  Surgeon: Dolores Patty, MD;  Location: [facility] INV[facility]IVE [facility] LAB;  Service: Cardiovascular;  Laterality: N/A;    There were no vitals filed for this visit.   Subjective Assessment - 03/10/20 0805    Subjective Patient reports the pain today is going down his right leg. He reports it is better overall though. he has been working on his exercises.    Pertinent History heart failure    Limitations Standing;Walking    How long can you sit comfortably? no pain    How long can you stand comfortably? pain with increased standing    How long can you walk comfortably? limited distances    Diagnostic tests did not recall    Patient Stated Goals to have less pain    Currently in Pain? Yes    Pain Score 4     Pain Location Back    Pain Orientation Right    Pain Descriptors / Indicators Aching    Pain  Radiating Towards into right posterior thigh    Pain Onset More than a month ago    Pain Frequency Intermittent    Aggravating Factors  standing and walking    Pain Relieving Factors rest and sitting    Effect of Pain on Daily Activities difficulty perfroming ADL's    Multiple Pain Sites No                             [facility] Adult PT Treatment/Exercise - 03/10/20 0001      Lumbar Exercises: Stretches   Lower Trunk Rotation Limitations 1 x 20      Lumbar Exercises: Aerobic   Nustep 3 min, became fatigued      Knee/Hip Exercises: Stretches   Active Hamstring Stretch Limitations reviewed seated for HEP    Other Knee/Hip Stretches piriformis stretch 2x20 sec hold    Other Knee/Hip Stretches seated hamstring stretch 3x20 sec bilateral      Knee/Hip Exercises: Standing   Other Standing Knee Exercises standing weight shift x20; slow march x10 each leg with empahsis on right single leg stance      Knee/Hip Exercises: Supine   Straight Leg Raises Strengthening;2 sets;Both  12 reps   Other Supine Knee/Hip Exercises clam shell 2 x 20 with green theraband    Other Supine Knee/Hip Exercises bent knee raises 2 x 20 alternating L/R      Manual Therapy   Joint Mobilization grade III LAD RLE only    Soft tissue mobilization trigger point rlease to lower lumbar spine    Muscle Energy Technique scissor technique - resisted R hip flexion, and L hip extension                  PT Education - 03/10/20 8341    Education Details reviewed techique with thera-ex.    Person(s) Educated Patient    Methods Explanation;Demonstration    Comprehension Verbalized understanding;Returned demonstration;Verbal cues required;Tactile cues required            PT Short Term Goals - 02/18/20 1128      PT SHORT TERM GOAL #1   Title Patient will demonstrate 5/5 gross right LE strength    Baseline 4/5 right hip flexion 4+/5 right knee extension    Time 3    Period Weeks    Status  New    Target Date 03/10/20      PT SHORT TERM GOAL #2   Title Patient will ambualte 500' without device without pain    Baseline using a cane with increased pain    Time 3    Period Weeks    Status New    Target Date 03/10/20      PT SHORT TERM GOAL #3   Title Patient will beindepdnent with basic HEP    Baseline No HEP    Time 3    Period Weeks    Status New    Target Date 03/10/20             PT Long Term Goals - 02/18/20 1131      PT LONG TERM GOAL #1   Title Patient will stand for 1 hour without increased pain in knees or hips    Baseline can stand 5-10 minutes before onset of pain    Time 6    Period Weeks    Status New    Target Date 03/31/20      PT LONG TERM GOAL #2   Title Patient will ambualte around grocery sotre without self report of pain or assitvie device.    Baseline pain with minimal ambualtion at this time    Time 6    Period Weeks    Status New    Target Date 03/31/20                 Plan - 03/10/20 1630    Clinical Impression Statement Patient tolerated treatment well. He hadtirgger points in his lumbar spine today. he was having pain down his leg coming in. Over all he feels like it is less intense and getting better. therapy reviewed ther-ex for home. He was gibven standing weight shifting exercises and slkow marching to improve single leg stance. We will continue to progress as tolerated.    Personal Factors and Comorbidities Comorbidity 1    Comorbidities remote hsitroy of shoulder pain    Examination-Activity Limitations Bend;Squat;Stairs;Stand    Examination-Participation Restrictions Cleaning;Community Activity;Shop    Stability/Clinical Decision Making Evolving/Moderate complexity    Clinical Decision Making Moderate    Rehab Potential Good    PT Frequency 1x / week    PT Duration 6 weeks    PT Treatment/Interventions ADLs/Self Care Home Management;Electrical  Stimulation;Cryotherapy;Iontophoresis 4mg /ml  Dexamethasone;Ultrasound;Gait training;DME Instruction;Functional mobility training;Therapeutic activities;Traction;Therapeutic exercise;Neuromuscular re-education;Stair training;Patient/family education;Manual techniques;Passive range of motion;Dry needling;Taping    PT Next Visit Plan continue with bialteral hip and knee stabilization activity. consider an intepreter although could answer all questions. Continue with stabilization. review HEP. Requires increased verbal/tactile cuing for technique; progress to standing exercises when able; add sidelying hip flexion; Add clamshell;  ptoential SIJ involvement    PT Home Exercise Plan SLR; Bridging, prifromis stretch;Access Code: [email or web address]  URL: [email or web address]  Date: 02/18/2020  Prepared by: 02/20/2020    Program Notes      Reps are just suggestions. Do as many as he can without pain but don't over-do it          Ice the right and left knee at home. Use a frozen bag of peas  or corn if no ice pack is available. Make sure something is in-between the skin and the ice.     Exercises  .Supine Bridge - 2 x daily - 7 x weekly - 2 sets - 10 reps  .Supine Active Straight Leg Raise - 2 x daily - 7 x weekly - 2 sets - 10 reps  .Supine Piriformis Stretch with Foot on Ground - 2 x daily - 7 x weekly - 1 sets - 3 reps - 20 hold    Consulted and Agree with Plan of Care Patient           Patient will benefit from skilled therapeutic intervention in order to improve the following deficits and impairments:  Abnormal gait,Decreased range of motion,Increased fascial restricitons,Pain,Decreased activity tolerance,Decreased endurance,Decreased safety awareness,Decreased strength,Decreased mobility  Visit Diagnosis: Pain in right hip  Chronic pain of right knee  Chronic pain of left knee  Greater trochanteric bursitis of right hip     Problem List Patient Active Problem List   Diagnosis Date Noted  . Adrenal insufficiency (HCC) 02/08/2020   . Acquired hypothyroidism 02/08/2020  . Malnutrition of moderate degree 11/18/2019  . Acute on chronic heart failure (HCC) 11/13/2019  . Acute systolic CHF (congestive heart failure) (HCC) 09/04/2019  . Microcytic anemia 09/04/2019  . Anasarca 09/04/2019  . Hyponatremia 09/03/2019    11/03/2019 03/10/2020, 4:34 PM  [facility] [address] [place], [place], [place] Phone: [phone]   Fax:  [phone]  Name: Jonta Gastineau MRN: Lendon Ka Date of Birth: 03-08-1955

## 2020-03-17 ENCOUNTER — Encounter: Payer: Self-pay | Admitting: Physical Therapy

## 2020-03-17 ENCOUNTER — Other Ambulatory Visit: Payer: Self-pay

## 2020-03-17 ENCOUNTER — Ambulatory Visit: Payer: Medicaid Other | Admitting: Physical Therapy

## 2020-03-17 DIAGNOSIS — M25562 Pain in left knee: Secondary | ICD-10-CM

## 2020-03-17 DIAGNOSIS — M25551 Pain in right hip: Secondary | ICD-10-CM | POA: Diagnosis not present

## 2020-03-17 DIAGNOSIS — G8929 Other chronic pain: Secondary | ICD-10-CM

## 2020-03-17 NOTE — Therapy (Signed)
[facility] Outpatient Rehabilitation [facility] [address] [place], [place], [address] Phone: [phone]   Fax:  [phone]  Physical Therapy Treatment  Patient Details  Name: Steven Frazier MRN: [id number] Date of Birth: [birth year] Referring Provider (PT): Dr Renaye Rakers   Encounter Date: 03/17/2020   PT End of Session - 03/17/20 0800    Visit Number 4    Number of Visits 12    Date for PT Re-Evaluation 03/31/20    Authorization Type [employer] -    Authorization Time Period pending auth.    PT Start Time 0800    PT Stop Time 0840    PT Time Calculation (min) 40 min    Activity Tolerance Patient tolerated treatment well    Behavior During Therapy WFL for tasks assessed/performed           Past Medical History:  Diagnosis Date  . Heart failure (HCC)   . Hypertension   . Hyponatremia   . Hypothyroidism   . Shoulder pain     Past Surgical History:  Procedure Laterality Date  . HERNIA REPAIR    . NO PAST SURGERIES    . RIGHT/LEFT HEART CATH AND CORONARY ANGIOGRAPHY N/A 11/16/2019   Procedure: RIGHT/LEFT HEART CATH AND CORONARY ANGIOGRAPHY;  Surgeon: Dolores Patty, MD;  Location: [facility];  Service: Cardiovascular;  Laterality: N/A;    There were no vitals filed for this visit.   Subjective Assessment - 03/17/20 0802    Subjective "I feel like the pain comes and goes. its not getting worse."    Patient Stated Goals to have less pain    Currently in Pain? Yes    Pain Score 4    used faces scale   Pain Location Back    Pain Orientation Left    Pain Descriptors / Indicators Aching    Pain Type Chronic pain    Pain Onset More than a month ago    Pain Frequency Intermittent    Aggravating Factors  standing, and getting up from a seated position.    Pain Relieving Factors rest and sitting              [facility] PT Assessment - 03/17/20 0001      Assessment   Medical Diagnosis Left Trochanteric brursitis    Referring  Provider (PT) Dr Renaye Rakers                         [facility] Adult PT Treatment/Exercise - 03/17/20 0001      Lumbar Exercises: Aerobic   Nustep L5 x 5 min UE/LE      Knee/Hip Exercises: Stretches   Active Hamstring Stretch 3 reps;Both;30 seconds   PNF contract/ relax     Knee/Hip Exercises: Seated   Long Arc Quad 2 sets      Knee/Hip Exercises: Supine   Straight Leg Raises Strengthening;Right;2 sets;15 reps    Other Supine Knee/Hip Exercises clam shell 2 x 20 with green theraband    Other Supine Knee/Hip Exercises bent knee raises 2 x 20 alternating L/R with greend band      Manual Therapy   Manual therapy comments tack and stretch of R hamstring    Joint Mobilization grade IV RLE    Soft tissue mobilization MTPR along lumbar spine and R hamstring    Muscle Energy Technique scissor technique - resisted R hip flexion, and L hip extension  PT Education - 03/17/20 0822    Education Details reviewed HEp and updated today to include clamshell and supine marching    Person(s) Educated Patient    Methods Explanation;Verbal cues;Handout    Comprehension Verbalized understanding;Verbal cues required            PT Short Term Goals - 03/17/20 0539      PT SHORT TERM GOAL #1   Title Patient will demonstrate 5/5 gross right LE strength    Period Weeks    Status On-going      PT SHORT TERM GOAL #2   Title Patient will ambualte 500' without device without pain    Period Weeks    Status On-going      PT SHORT TERM GOAL #3   Title Patient will beindepdnent with basic HEP    Period Weeks    Status Achieved             PT Long Term Goals - 03/17/20 7673      PT LONG TERM GOAL #1   Title Patient will stand for 1 hour without increased pain in knees or hips    Period Weeks    Status On-going      PT LONG TERM GOAL #2   Title Patient will ambualte around grocery sotre without self report of pain or assitvie device.    Period Weeks     Status On-going                 Plan - 03/17/20 0818    Clinical Impression Statement pt arrives reporting continued 4/10 pain located in the rlow back and additionaly reports L knee pain which is likely a result of compensation. continued addressing R SIJ utilizing hip flexor METS, as well as gross hip strengthening. end of session he reported pain. end of session he reported pain in the back dropped to 2/10 (using the faces scale).    PT Treatment/Interventions ADLs/Self Care Home Management;Electrical Stimulation;Cryotherapy;Iontophoresis 4mg /ml Dexamethasone;Ultrasound;Gait training;DME Instruction;Functional mobility training;Therapeutic activities;Traction;Therapeutic exercise;Neuromuscular re-education;Stair training;Patient/family education;Manual techniques;Passive range of motion;Dry needling;Taping    PT Next Visit Plan continue with bialteral hip and knee stabilization activity. consider an intepreter although could answer all questions. Continue with stabilization. review HEP. Requires increased verbal/tactile cuing for technique; progress to standing exercises when able; add sidelying hip flexion; Add clamshell;  potential SIJ involvement on RLE    PT Home Exercise Plan SLR; Bridging, prifromis stretch;Access Code: [email or web address]  URL: [email or web address]  Date: 02/18/2020  Prepared by: 02/20/2020    Program Notes      Reps are just suggestions. Do as many as he can without pain but don't over-do it          Ice the right and left knee at home. Use a frozen bag of peas  or corn if no ice pack is available. Make sure something is in-between the skin and the ice.     Exercises  .Supine Bridge - 2 x daily - 7 x weekly - 2 sets - 10 reps  .Supine Active Straight Leg Raise - 2 x daily - 7 x weekly - 2 sets - 10 reps  .Supine Piriformis Stretch with Foot on Ground - 2 x daily - 7 x weekly - 1 sets - 3 reps - 20 hold    Consulted and Agree with Plan of Care Patient            Patient will benefit from skilled therapeutic intervention in order to improve the  following deficits and impairments:  Abnormal gait,Decreased range of motion,Increased fascial restricitons,Pain,Decreased activity tolerance,Decreased endurance,Decreased safety awareness,Decreased strength,Decreased mobility  Visit Diagnosis: Pain in right hip  Chronic pain of right knee  Chronic pain of left knee     Problem List Patient Active Problem List   Diagnosis Date Noted  . Adrenal insufficiency (HCC) 02/08/2020  . Acquired hypothyroidism 02/08/2020  . Malnutrition of moderate degree 11/18/2019  . Acute on chronic heart failure (HCC) 11/13/2019  . Acute systolic CHF (congestive heart failure) (HCC) 09/04/2019  . Microcytic anemia 09/04/2019  . Anasarca 09/04/2019  . Hyponatremia 09/03/2019   Lulu Riding PT, DPT, LAT, ATC  03/17/20  8:42 AM      [facility] Outpatient Rehabilitation [facility] [address] [place], [place], [address] Phone: [phone]   Fax:  [phone]  Name: Steven Frazier MRN: [id number] Date of Birth: [birth year]

## 2020-03-23 NOTE — Progress Notes (Signed)
Patient did not show for appt. Note left for templating purposes only     Advanced Heart Failure Clinic Note   Referring Physician: PCP: Trey Sailors, PA PCP-Cardiologist: Dr. Haroldine Laws   Reason for Visit: Chronic Systolic Heart Failure  HPI: Steven Frazier is a 65 y.o. male from [place] (emigrated [birth year]) with systolic HF (EF 75-17%), HTN, and hypothyroidism.    Admitted 8/21 with hypervolemic hyponatremia in the setting of systolic HF with Na 001. Also noted to have unintentional significant weight loss (~20-25 pounds), low albumin, microcytic anemia and eosinophilia. Work-up including peripheral smear, HIV, hepatitis and parasitic screens negative.    Readmitted 10/21 with marked volume overload. Had [facility] showing severe NICM EF 20%, with normal coronaries and mildly elevated filling pressures w/ normal cardiac output. Found to have adrenal insuffiencey. Cortisol level 0.4. D/w endocrinology who recommended hydrocortisone 10 mg qam/ 5 mg pm + brain MRI w/ pituitary protocol. MRI was unremarkable.   Today he returns for HF follow up with his daughter. He was seen the HF clinic last week and was volume overloaded so spiro was increased to 50 mg daily and torsemide 20 mg was restarted. Overall feeling much better. Able to walk around the store without using a cane.  Denies SOB/PND/Orthopnea. Appetite ok. No fever or chills. Weight at home down from 140-->135    pounds. Taking all medications.  Cardiac Studies  Echo 8/21: EF 40%. RV mildly reduced   R/LHC 10/21:  Ao = 123/72 (94) LV = 122/15 RA = 9 RV = 48/12 PA = 49/17 (31) PCW = 22 (v = 31) Fick cardiac output/index = 4.1/2.4 PVR = 2.2 WU  FA sat = 99% PA sat = 68%, 69% PAPi = 3.5  Assessment: 1. Severe NICM EF 20% 2. Normal coronaries 3. Mildly elevated filling pressures with normal CO  cMRI 10/21 IMPRESSION: 1. Diffuse subcutaneous and mesenteric edema with small amount of ascites along the paracolic gutters  and mild perihepatic and perisplenic ascites. The appearance is compatible with third spacing of fluid and may be related to the patient's hypoalbuminemia and hypoproteinemia. 2. Mild to moderate cardiomegaly. 3. Small left and moderate right pleural effusions with passive atelectasis. 4. Prominence of the lateral segment left hepatic lobe could be a sign of early cirrhosis. 5. Nonunited fractures of the left ninth, tenth, and eleventh ribs posterolaterally with a partially healed fracture of the left twelfth rib posterolaterally. 6. Disc bulges at L3-4, L4-5, and L5-S1. 7. Small left hydrocele along the spermatic cord. 8. Aortic atherosclerosis.  Review of systems complete and found to be negative unless listed in HPI.     Past Medical History:  Diagnosis Date   Heart failure (HCC)    Hypertension    Hyponatremia    Hypothyroidism    Shoulder pain     Current Outpatient Medications  Medication Sig Dispense Refill   aspirin EC 81 MG EC tablet Take 1 tablet (81 mg total) by mouth daily. Swallow whole. 30 tablet 11   dapagliflozin propanediol (FARXIGA) 10 MG TABS tablet Take 1 tablet (10 mg total) by mouth daily before breakfast. 30 tablet 3   folic acid (FOLVITE) 1 MG tablet Take 1 tablet (1 mg total) by mouth daily. 30 tablet 0   hydrocortisone (CORTEF) 10 MG tablet Take 1 tablet (10 mg total) by mouth daily with breakfast. 100 tablet 3   hydrocortisone (CORTEF) 5 MG tablet Take 1 tablet (5 mg total) by mouth every evening. 100  tablet 3   isosorbide-hydrALAZINE (BIDIL) 20-37.5 MG tablet Take 1 tablet by mouth 3 (three) times daily. 90 tablet 11   levothyroxine (SYNTHROID) 50 MCG tablet Take 1 tablet (50 mcg total) by mouth daily. 90 tablet 3   MULTIPLE VITAMIN PO Take 1 tablet by mouth daily.     rosuvastatin (CRESTOR) 10 MG tablet Take 1 tablet (10 mg total) by mouth daily. 30 tablet 6   sacubitril-valsartan (ENTRESTO) 97-103 MG Take 1 tablet by mouth 2 (two) times daily.      spironolactone (ALDACTONE) 50 MG tablet Take 1 tablet (50 mg total) by mouth at bedtime. 30 tablet 6   torsemide (DEMADEX) 20 MG tablet Take 1 tablet (20 mg total) by mouth daily. 30 tablet 6   No current facility-administered medications for this encounter.    No Known Allergies    Social History   Socioeconomic History   Marital status: Married    Spouse name: Not on file   Number of children: Not on file   Years of education: Not on file   Highest education level: Not on file  Occupational History   Not on file  Tobacco Use   Smoking status: Never Smoker   Smokeless tobacco: Never Used  Substance and Sexual Activity   Alcohol use: Not Currently   Drug use: Never   Sexual activity: Not on file  Other Topics Concern   Not on file  Social History Narrative   Not on file   Social Determinants of Health   Financial Resource Strain: Not on file  Food Insecurity: No Food Insecurity   Worried About Running Out of Food in the Last Year: Never true   [place] in the Last Year: Never true  Transportation Needs: No Transportation Needs   Lack of Transportation (Medical): No   Lack of Transportation (Non-Medical): No  Physical Activity: Not on file  Stress: Not on file  Social Connections: Not on file  Intimate Partner Violence: Not on file     No family history on file.  There were no vitals filed for this visit. Wt Readings from Last 3 Encounters:  02/09/20 62.8 kg (138 lb 6.4 oz)  02/08/20 62.8 kg (138 lb 8 oz)  02/02/20 66.3 kg (146 lb 3.2 oz)     PHYSICAL EXAM: General:  Walked in the clinic with a cane. Thin.  No resp difficulty HEENT: normal Neck: supple. no JVD. Carotids 2+ bilat; no bruits. No lymphadenopathy or thryomegaly appreciated. Cor: PMI nondisplaced. Regular rate & rhythm. No rubs, gallops or murmurs. Lungs: clear Abdomen: soft, nontender, nondistended. No hepatosplenomegaly. No bruits or masses. Good bowel sounds. Extremities: no  cyanosis, clubbing, rash, edema Neuro: alert & orientedx3, cranial nerves grossly intact. moves all 4 extremities w/o difficulty. Affect pleasant    ASSESSMENT & PLAN:  1.  Chronic Systolic Heart Failure - Echo 8/21 showed EF 40%. RV mildly reduced  - [facility] 10/21 showed severe NICM EF 20%, with normal coronaries and mildly elevated filling pressures w/ normal cardiac output. - cMRI suggestive of previous myocarditis vs infiltrative. EF 37% - PYP visual and quantitative assessment (grade 1 and H/CL = 1.18) are equivocal for transthyretin amyloidosis => probably not TTR amyloidosis. - auto-immune serologies negative. ESR 5, SPEP/serum light chains negative - ? secondary adrenocortical insufficiency as etiology of HF. Serum cortisol level was markedly low at 0.4 (10/21)>>now on hydrocortisone 10 mg qam/ 5 mg pm. Brain MRI unrevealing - Reds Clip 29%.  - NYHA II.  Functional improvement. Volume status stable. Continue  torsemide 20 mg daily. -Continue Spiro to 50 mg daily  - Continue Farxiga 10 mg daily - Continue Entresto 97-103 mg bid.    - Continue Bidil 1 tablet tid  - avoiding ? blocker w/ marked 1st degree AVB - Repeat ECHO next visit.   2. Adrenal Insuffiencey  - Serum Cortisol markedly low 0.4 10/21  - brain MRI w/ pituitary protocol unremarkable. - C/w hydrocortisone 10 mg qam/ 5 mg pm,  - Followed by  Endocrinology.  3. HTN - Stable Continue current regimen.      Glori Bickers, MD 03/23/20

## 2020-03-24 ENCOUNTER — Encounter: Payer: Self-pay | Admitting: Physical Therapy

## 2020-03-24 ENCOUNTER — Inpatient Hospital Stay (HOSPITAL_BASED_OUTPATIENT_CLINIC_OR_DEPARTMENT_OTHER)
Admission: RE | Admit: 2020-03-24 | Discharge: 2020-03-24 | Disposition: A | Discharge status: Home / Self Care | Payer: Medicaid Other | Source: Ambulatory Visit | Attending: Internal Medicine | Admitting: Internal Medicine

## 2020-03-24 ENCOUNTER — Other Ambulatory Visit: Payer: Self-pay

## 2020-03-24 ENCOUNTER — Ambulatory Visit: Payer: Medicaid Other | Admitting: Physical Therapy

## 2020-03-24 DIAGNOSIS — M25551 Pain in right hip: Secondary | ICD-10-CM

## 2020-03-24 DIAGNOSIS — M25562 Pain in left knee: Secondary | ICD-10-CM

## 2020-03-24 DIAGNOSIS — M7061 Trochanteric bursitis, right hip: Secondary | ICD-10-CM

## 2020-03-24 DIAGNOSIS — M25561 Pain in right knee: Secondary | ICD-10-CM

## 2020-03-24 DIAGNOSIS — G8929 Other chronic pain: Secondary | ICD-10-CM

## 2020-03-24 DIAGNOSIS — I5022 Chronic systolic (congestive) heart failure: Secondary | ICD-10-CM

## 2020-03-25 ENCOUNTER — Encounter (HOSPITAL_COMMUNITY): Payer: Medicaid Other | Admitting: Internal Medicine

## 2020-03-25 ENCOUNTER — Ambulatory Visit (HOSPITAL_COMMUNITY): Payer: Medicaid Other

## 2020-03-25 NOTE — Therapy (Signed)
[place], [place], [address] Phone: [phone]   Fax:  [phone]  Physical Therapy Treatment/Discharge   Patient Details  Name: Steven Frazier MRN: [id number] Date of Birth: Sep 04, 1955 Referring Provider (PT): Dr Fredonia Highland   Encounter Date: 03/24/2020   PT End of Session - 03/24/20 0819    Visit Number 5    Number of Visits 12    Date for PT Re-Evaluation 03/31/20    Authorization Type [employer] -    Authorization Time Period pending auth.    PT Start Time 0800    PT Stop Time 0840    PT Time Calculation (min) 40 min    Activity Tolerance Patient tolerated treatment well    Behavior During Therapy WFL for tasks assessed/performed           Past Medical History:  Diagnosis Date  . Heart failure ([place])   . Hypertension   . Hyponatremia   . Hypothyroidism   . Shoulder pain     Past Surgical History:  Procedure Laterality Date  . HERNIA REPAIR    . NO PAST SURGERIES    . RIGHT/LEFT HEART CATH AND CORONARY ANGIOGRAPHY N/A 11/16/2019   Procedure: RIGHT/LEFT HEART CATH AND CORONARY ANGIOGRAPHY;  Surgeon: Jolaine Artist, MD;  Location: [facility];  Service: Cardiovascular;  Laterality: N/A;    There were no vitals filed for this visit.   Subjective Assessment - 03/24/20 0816    Subjective The patient feels like he has had a significant improvement in his pain. Today he has pain in both knees but it is not significant. He feels like his exercises are helping at home.    Pertinent History heart failure    Limitations Standing;Walking    How long can you sit comfortably? no pain    How long can you stand comfortably? pain with increased standing    How long can you walk comfortably? limited distances    Diagnostic tests did not recall    Patient Stated Goals to have less pain    Currently in Pain? Yes    Pain Score 2     Pain Location Knee    Pain Orientation  Right;Left    Pain Descriptors / Indicators Aching    Pain Type Chronic pain    Pain Onset More than a month ago    Pain Frequency Constant    Aggravating Factors  standing and walking    Pain Relieving Factors sitting    Effect of Pain on Daily Activities difficulty perfroming ADL's    Multiple Pain Sites No              [facility] PT Assessment - 03/25/20 0001      Strength   Right Hip Flexion 4+/5    Right Hip ABduction 5/5    Right Hip ADduction 5/5    Right Knee Flexion 5/5    Right Knee Extension 5/5                         [facility] Adult PT Treatment/Exercise - 03/25/20 0001      Self-Care   Self-Care Other Self-Care Comments    Other Self-Care Comments  reviewed how to progress HEP      Lumbar Exercises: Stretches   Lower Trunk Rotation Limitations 1 x 20      Lumbar Exercises: Aerobic   Nustep L5 x 5 min UE/LE  Knee/Hip Exercises: Stretches   Active Hamstring Stretch 3 reps;Both;30 seconds   PNF contract/ relax   Active Hamstring Stretch Limitations seated    Other Knee/Hip Stretches piriformis stretch 2x20 sec hold    Other Knee/Hip Stretches seated hamstring stretch 3x20 sec bilateral      Knee/Hip Exercises: Standing   Other Standing Knee Exercises standing weight shift x20; slow march x10 each leg with empahsis on right single leg stance    Other Standing Knee Exercises standing hip abdcution 2x10 each leg; standing hip extension 2x10      Knee/Hip Exercises: Supine   Straight Leg Raises Strengthening;Right;2 sets;15 reps    Other Supine Knee/Hip Exercises clam shell 2 x 20 with green theraband    Other Supine Knee/Hip Exercises bent knee raises 2 x 20 alternating L/R with greend band                  PT Education - 03/24/20 0819    Education Details reviewed final HEP    Person(s) Educated Patient    Methods Demonstration;Tactile cues;Verbal cues;Explanation    Comprehension Verbalized understanding;Returned  demonstration;Verbal cues required;Tactile cues required            PT Short Term Goals - 03/25/20 0827      PT SHORT TERM GOAL #1   Title Patient will demonstrate 5/5 gross right LE strength    Baseline 4+/5 right hip flexion 4+/5 right knee extension    Time 3    Period Weeks    Status Achieved    Target Date 03/10/20      PT SHORT TERM GOAL #2   Title Patient will ambualte 500' without device without pain    Baseline still using a cane but putting very little weight through the cane    Time 3    Period Weeks    Status Achieved    Target Date 03/10/20      PT SHORT TERM GOAL #3   Title Patient will beindepdnent with basic HEP    Baseline using his HEP    Time 3    Period Weeks    Status Achieved             PT Long Term Goals - 03/25/20 3716      PT LONG TERM GOAL #1   Title Patient will stand for 1 hour without increased pain in knees or hips    Baseline significant improvement in ability to sit    Time 6    Period Weeks    Status Achieved      PT LONG TERM GOAL #2   Title Patient will ambualte around grocery sotre without self report of pain or assitvie device.    Baseline pain with minimal ambualtion at this time    Time 6    Period Weeks    Status Achieved                 Plan - 03/24/20 9678    Clinical Impression Statement Patient has made good progress over his visits. He has intemrittent pain in his knees. Tihs seems to come and go. Therapy reviewed exdercises that will help both his knees and his hips. Therapy added a standing series of exercises.to work on at home. The patient is very compliant with his HEP. He feels comfortable continueing with his HEP at home. We will discharge him today to his HEP. See below for goal specific progress.    Personal Factors and Comorbidities Comorbidity  1    Comorbidities remote hsitroy of shoulder pain    Examination-Activity Limitations Bend;Squat;Stairs;Stand    Examination-Participation Restrictions  Cleaning;Community Activity;Shop    Stability/Clinical Decision Making Evolving/Moderate complexity    Clinical Decision Making Moderate    Rehab Potential Good    PT Frequency 1x / week    PT Duration 6 weeks    PT Treatment/Interventions ADLs/Self Care Home Management;Electrical Stimulation;Cryotherapy;Iontophoresis 59m/ml Dexamethasone;Ultrasound;Gait training;DME Instruction;Functional mobility training;Therapeutic activities;Traction;Therapeutic exercise;Neuromuscular re-education;Stair training;Patient/family education;Manual techniques;Passive range of motion;Dry needling;Taping    PT Next Visit Plan D/C to HEP    PT Home Exercise Plan SLR; Bridging, prifromis stretch;Access Code: [email or web address] URL: [email or web address]  Date: 02/18/2020  Prepared by: DCarolyne Littles   Program Notes      Reps are just suggestions. Do as many as he can without pain but don't over-do it          Ice the right and left knee at home. Use a frozen bag of peas  or corn if no ice pack is available. Make sure something is in-between the skin and the ice.     Exercises  .Supine Bridge - 2 x daily - 7 x weekly - 2 sets - 10 reps  .Supine Active Straight Leg Raise - 2 x daily - 7 x weekly - 2 sets - 10 reps  .Supine Piriformis Stretch with Foot on Ground - 2 x daily - 7 x weekly - 1 sets - 3 reps - 20 hold    Consulted and Agree with Plan of Care Patient           Patient will benefit from skilled therapeutic intervention in order to improve the following deficits and impairments:  Abnormal gait,Decreased range of motion,Increased fascial restricitons,Pain,Decreased activity tolerance,Decreased endurance,Decreased safety awareness,Decreased strength,Decreased mobility  Visit Diagnosis: Pain in right hip  Chronic pain of right knee  Chronic pain of left knee  Greater trochanteric bursitis of right hip    PHYSICAL THERAPY DISCHARGE SUMMARY  Visits from Start of Care: 5  Current functional level  related to goals / functional outcomes: Improved hip and back pain   Remaining deficits: Knee pain at times   Education / Equipment: HEP   Plan: Patient agrees to discharge.  Patient goals were met. Patient is being discharged due to meeting the stated rehab goals.  ?????     Problem List Patient Active Problem List   Diagnosis Date Noted  . Adrenal insufficiency ([place] 02/08/2020  . Acquired hypothyroidism 02/08/2020  . Malnutrition of moderate degree 11/18/2019  . Acute on chronic heart failure ([place] 11/13/2019  . Acute systolic CHF (congestive heart failure) ([place] 09/04/2019  . Microcytic anemia 09/04/2019  . Anasarca 09/04/2019  . Hyponatremia 09/03/2019    DCarney Living PT DPT  03/25/2020, 9:29 AM  [facility] [place] [address]Phone: [phone]  Fax:  [phone] Name: FDarious RehmanMRN: [id number] of Birth: 206-21-1957

## 2020-04-03 NOTE — Progress Notes (Signed)
Advanced Heart Failure Clinic Note   Referring Physician: PCP: Trey Sailors, PA PCP-Cardiologist: Dr. Haroldine Laws   Reason for Visit: Chronic Systolic Heart Failure  HPI: Steven Frazier is a 65 y.o. male from [place] ([place] [birth year][place] with systolic HF (EF 02-11%), HTN, and hypothyroidism.    Admitted 8/21 with hypervolemic hyponatremia in the setting of systolic HF with Na 155. Also noted to have unintentional significant weight loss (~20-25 pounds), low albumin, microcytic anemia and eosinophilia. Work-up including peripheral smear, HIV, hepatitis and parasitic screens negative.    Readmitted 10/21 with marked volume overload. Had [facility] showing severe NICM EF 20%, with normal coronaries and mildly elevated filling pressures w/ normal cardiac output. Found to have adrenal insuffiencey. Cortisol level 0.4. D/w endocrinology who recommended hydrocortisone 10 mg qam/ 5 mg pm + brain MRI w/ pituitary protocol. MRI was unremarkable.   Today he returns for HF follow up with his daughter. He was seen the HF clinic recently and was volume overloaded so spiro was increased to 50 mg daily and torsemide 20 mg was restarted. Feels good. Denies SOB, orthopnea or PND. No edema. Complaint with med. Drinks a lot of fluids.   Cardiac Studies  Echo 8/21: EF 40%. RV mildly reduced   R/LHC 10/21:  Ao = 123/72 (94) LV = 122/15 RA = 9 RV = 48/12 PA = 49/17 (31) PCW = 22 (v = 31) Fick cardiac output/index = 4.1/2.4 PVR = 2.2 WU  FA sat = 99% PA sat = 68%, 69% PAPi = 3.5  Assessment: 1. Severe NICM EF 20% 2. Normal coronaries 3. Mildly elevated filling pressures with normal CO  cMRI 10/21 IMPRESSION: 1. Diffuse subcutaneous and mesenteric edema with small amount of ascites along the paracolic gutters and mild perihepatic and perisplenic ascites. The appearance is compatible with third spacing of fluid and may be related to the patient's hypoalbuminemia and hypoproteinemia. 2. Mild to  moderate cardiomegaly. 3. Small left and moderate right pleural effusions with passive atelectasis. 4. Prominence of the lateral segment left hepatic lobe could be a sign of early cirrhosis. 5. Nonunited fractures of the left ninth, tenth, and eleventh ribs posterolaterally with a partially healed fracture of the left twelfth rib posterolaterally. 6. Disc bulges at L3-4, L4-5, and L5-S1. 7. Small left hydrocele along the spermatic cord. 8. Aortic atherosclerosis.  Review of systems complete and found to be negative unless listed in HPI.     Past Medical History:  Diagnosis Date  . Heart failure ([place])   . Hypertension   . Hyponatremia   . Hypothyroidism   . Shoulder pain     Current Outpatient Medications  Medication Sig Dispense Refill  . aspirin EC 81 MG EC tablet Take 1 tablet (81 mg total) by mouth daily. Swallow whole. 30 tablet 11  . dapagliflozin propanediol (FARXIGA) 10 MG TABS tablet Take 1 tablet (10 mg total) by mouth daily before breakfast. 30 tablet 3  . folic acid (FOLVITE) 1 MG tablet Take 1 tablet (1 mg total) by mouth daily. 30 tablet 0  . hydrocortisone (CORTEF) 10 MG tablet Take 1 tablet (10 mg total) by mouth daily with breakfast. 100 tablet 3  . hydrocortisone (CORTEF) 5 MG tablet Take 1 tablet (5 mg total) by mouth every evening. 100 tablet 3  . isosorbide-hydrALAZINE (BIDIL) 20-37.5 MG tablet Take 1 tablet by mouth 3 (three) times daily. 90 tablet 11  . levothyroxine (SYNTHROID) 50 MCG tablet Take 1 tablet (50 mcg total) by mouth daily.  90 tablet 3  . MULTIPLE VITAMIN PO Take 1 tablet by mouth daily.    . rosuvastatin (CRESTOR) 10 MG tablet Take 1 tablet (10 mg total) by mouth daily. 30 tablet 6  . sacubitril-valsartan (ENTRESTO) 97-103 MG Take 1 tablet by mouth 2 (two) times daily.    Marland Kitchen spironolactone (ALDACTONE) 50 MG tablet Take 1 tablet (50 mg total) by mouth at bedtime. 30 tablet 6  . torsemide (DEMADEX) 20 MG tablet Take 1 tablet (20 mg total) by mouth  daily. 30 tablet 6   No current facility-administered medications for this encounter.    No Known Allergies    Social History   Socioeconomic History  . Marital status: Married    Spouse name: Not on file  . Number of children: Not on file  . Years of education: Not on file  . Highest education level: Not on file  Occupational History  . Not on file  Tobacco Use  . Smoking status: Never Smoker  . Smokeless tobacco: Never Used  Substance and Sexual Activity  . Alcohol use: Not Currently  . Drug use: Never  . Sexual activity: Not on file  Other Topics Concern  . Not on file  Social History Narrative  . Not on file   Social Determinants of Health   Financial Resource Strain: Not on file  Food Insecurity: No Food Insecurity  . Worried About Charity fundraiser in the Last Year: Never true  . Ran Out of Food in the Last Year: Never true  Transportation Needs: No Transportation Needs  . Lack of Transportation (Medical): No  . Lack of Transportation (Non-Medical): No  Physical Activity: Not on file  Stress: Not on file  Social Connections: Not on file  Intimate Partner Violence: Not on file     No family history on file.  Vitals:   04/04/20 1146  BP: 132/80  Pulse: 79  SpO2: 96%  Weight: 66.5 kg (146 lb 9.6 oz)   Wt Readings from Last 3 Encounters:  04/04/20 66.5 kg (146 lb 9.6 oz)  02/09/20 62.8 kg (138 lb 6.4 oz)  02/08/20 62.8 kg (138 lb 8 oz)     PHYSICAL EXAM: General:  Well appearing. No resp difficulty HEENT: normal Neck: supple. JVP 6-7  Carotids 2+ bilat; no bruits. No lymphadenopathy or thryomegaly appreciated. Cor: PMI nondisplaced. Regular rate & rhythm. No rubs, gallops or murmurs. Lungs: clear Abdomen: soft, nontender, nondistended. No hepatosplenomegaly. No bruits or masses. Good bowel sounds. Extremities: no cyanosis, clubbing, rash, edema Neuro: alert & orientedx3, cranial nerves grossly intact. moves all 4 extremities w/o difficulty.  Affect pleasant  ASSESSMENT & PLAN:  1.  Chronic Systolic Heart Failure - Echo 8/21 showed EF 40%. RV mildly reduced  - [facility] 10/21 showed severe NICM EF 20%, with normal coronaries and mildly elevated filling pressures w/ normal cardiac output. - cMRI suggestive of previous myocarditis vs infiltrative. EF 37% - PYP visual and quantitative assessment (grade 1 and H/CL = 1.18) are equivocal for transthyretin amyloidosis => probably not TTR amyloidosis. - auto-immune serologies negative. ESR 5, SPEP/serum light chains negative - ? secondary adrenocortical insufficiency as etiology of HF. Serum cortisol level was markedly low at 0.4 (10/21)>>now on hydrocortisone 10 mg qam/ 5 mg pm. Brain MRI unrevealing - Stable NYHA II - ReDS 29% -> 37%. Volume status up. Increase torsemide to 40 daily. Limit fluid intake.  -Continue Spiro 50 mg daily  - Continue Farxiga 10 mg daily - Continue Entresto 97-103  mg bid.    - Continue Bidil 1 tablet tid  - avoiding ? blocker w/ marked 1st degree AVB - Missed echo today. Will reschedule  - Check labs  2. Adrenal Insuffiencey  - Serum Cortisol markedly low 0.4 10/21  - brain MRI w/ pituitary protocol unremarkable. - C/w hydrocortisone 10 mg qam/ 5 mg pm,  - Followed by  Endocrinology. - No change  3. HTN - Stable Continue current regimen    Glori Bickers, MD 04/03/20

## 2020-04-04 ENCOUNTER — Encounter (HOSPITAL_COMMUNITY): Payer: Self-pay | Admitting: Internal Medicine

## 2020-04-04 ENCOUNTER — Other Ambulatory Visit: Payer: Self-pay

## 2020-04-04 ENCOUNTER — Ambulatory Visit (HOSPITAL_COMMUNITY)
Admission: RE | Admit: 2020-04-04 | Discharge: 2020-04-04 | Disposition: A | Discharge status: Home / Self Care | Payer: Medicaid Other | Source: Ambulatory Visit | Attending: Internal Medicine | Admitting: Internal Medicine

## 2020-04-04 VITALS — BP 132/80 | HR 79 | Wt 143.2 lb

## 2020-04-04 DIAGNOSIS — E2740 Unspecified adrenocortical insufficiency: Secondary | ICD-10-CM

## 2020-04-04 DIAGNOSIS — R634 Abnormal weight loss: Secondary | ICD-10-CM | POA: Diagnosis not present

## 2020-04-04 DIAGNOSIS — I5022 Chronic systolic (congestive) heart failure: Secondary | ICD-10-CM | POA: Diagnosis not present

## 2020-04-04 DIAGNOSIS — Z7982 Long term (current) use of aspirin: Secondary | ICD-10-CM | POA: Diagnosis not present

## 2020-04-04 DIAGNOSIS — D509 Iron deficiency anemia, unspecified: Secondary | ICD-10-CM | POA: Diagnosis not present

## 2020-04-04 DIAGNOSIS — E8809 Other disorders of plasma-protein metabolism, not elsewhere classified: Secondary | ICD-10-CM | POA: Insufficient documentation

## 2020-04-04 DIAGNOSIS — I10 Essential (primary) hypertension: Secondary | ICD-10-CM

## 2020-04-04 DIAGNOSIS — Z7984 Long term (current) use of oral hypoglycemic drugs: Secondary | ICD-10-CM | POA: Diagnosis not present

## 2020-04-04 DIAGNOSIS — Z79890 Hormone replacement therapy (postmenopausal): Secondary | ICD-10-CM | POA: Insufficient documentation

## 2020-04-04 DIAGNOSIS — Z79899 Other long term (current) drug therapy: Secondary | ICD-10-CM | POA: Diagnosis not present

## 2020-04-04 DIAGNOSIS — Z6824 Body mass index (BMI) 24.0-24.9, adult: Secondary | ICD-10-CM | POA: Insufficient documentation

## 2020-04-04 DIAGNOSIS — D721 Eosinophilia, unspecified: Secondary | ICD-10-CM | POA: Diagnosis not present

## 2020-04-04 DIAGNOSIS — E039 Hypothyroidism, unspecified: Secondary | ICD-10-CM | POA: Diagnosis not present

## 2020-04-04 DIAGNOSIS — I110 Hypertensive heart disease with heart failure: Secondary | ICD-10-CM | POA: Insufficient documentation

## 2020-04-04 HISTORY — DX: Heart failure, unspecified: I50.9

## 2020-04-04 LAB — BASIC METABOLIC PANEL
Anion gap: 11 (ref 5–15)
BUN: 10 mg/dL (ref 8–23)
CO2: 22 mmol/L (ref 22–32)
Calcium: 9 mg/dL (ref 8.9–10.3)
Chloride: 101 mmol/L (ref 98–111)
Creatinine, Ser: 1.21 mg/dL (ref 0.61–1.24)
GFR, Estimated: >60 mL/min (ref >60)
Glucose, Bld: 110 mg/dL — ABNORMAL HIGH (ref 70–99)
Potassium: 4.8 mmol/L (ref 3.5–5.1)
Sodium: 134 mmol/L — ABNORMAL LOW (ref 135–145)

## 2020-04-04 LAB — BRAIN NATRIURETIC PEPTIDE: B Natriuretic Peptide: 280.1 pg/mL — ABNORMAL HIGH (ref 0.0–100.0)

## 2020-04-04 MED ORDER — TORSEMIDE 20 MG PO TABS: 40.0000 mg | ORAL_TABLET | Freq: Every day | ORAL | 6 refills | Status: DC

## 2020-04-04 NOTE — Addendum Note (Signed)
Encounter addended by: Dolores Patty, MD on: 04/04/2020 12:57 PM  Actions taken: Level of Service modified, Visit diagnoses modified

## 2020-04-04 NOTE — Patient Instructions (Signed)
Increase Torsemide to 40 mg (2 tabs) Daily  Labs done today, we will call you for abnormal results  Your physician has requested that you have an echocardiogram. Echocardiography is a painless test that uses sound waves to create images of your heart. It provides your doctor with information about the size and shape of your heart and how well your heart's chambers and valves are working. This procedure takes approximately one hour. There are no restrictions for this procedure.  Your physician recommends that you schedule a follow-up appointment in: 1 month  If you have any questions or concerns before your next appointment please send [place] a message through [place] or call our office at [phone].    TO LEAVE A MESSAGE FOR THE NURSE SELECT OPTION 2, PLEASE LEAVE A MESSAGE INCLUDING: . YOUR NAME . DATE OF BIRTH . CALL BACK NUMBER . REASON FOR CALL**this is important as we prioritize the call backs  YOU WILL RECEIVE A CALL BACK THE SAME DAY AS LONG AS YOU CALL BEFORE 4:00 PM  At the [facility], you and your health needs are our priority. As part of our continuing mission to provide you with exceptional heart care, we have created designated Provider Care Teams. These Care Teams include your primary Cardiologist (physician) and Advanced Practice Providers (APPs- Physician Assistants and Nurse Practitioners) who all work together to provide you with the care you need, when you need it.   You may see any of the following providers on your designated Care Team at your next follow up: Marland Kitchen Dr Arvilla Meres . Dr Marca Ancona . Dr Thornell Mule . Tonye Becket, NP . Robbie Lis, PA . Shanda Bumps [place],NP . Karle Plumber, PharmD   Please be sure to bring in all your medications bottles to every appointment.

## 2020-04-04 NOTE — Progress Notes (Signed)
ReDS Vest / Clip - 04/04/20 1200      ReDS Vest / Clip   Station Marker A    Ruler Value 27.5    ReDS Value Range Moderate volume overload    ReDS Actual Value 37

## 2020-04-07 MED FILL — BIDIL 20-37.5 MG TABS: 20-37.5 | 30 days supply | Qty: 90 | Fill #3

## 2020-04-21 ENCOUNTER — Other Ambulatory Visit (HOSPITAL_COMMUNITY): Payer: Self-pay | Admitting: *Deleted

## 2020-04-21 MED ORDER — TORSEMIDE 20 MG PO TABS
40.0000 mg | ORAL_TABLET | Freq: Every day | ORAL | 6 refills | Status: DC
Start: 2020-04-21 — End: 2020-08-17

## 2020-04-26 ENCOUNTER — Ambulatory Visit (HOSPITAL_COMMUNITY)
Admission: RE | Admit: 2020-04-26 | Discharge: 2020-04-26 | Disposition: A | Discharge status: Home / Self Care | Payer: Medicaid Other | Source: Ambulatory Visit | Attending: Internal Medicine | Admitting: Internal Medicine

## 2020-04-26 ENCOUNTER — Other Ambulatory Visit: Payer: Self-pay

## 2020-04-26 DIAGNOSIS — I5022 Chronic systolic (congestive) heart failure: Secondary | ICD-10-CM | POA: Diagnosis present

## 2020-04-26 LAB — ECHOCARDIOGRAM COMPLETE
AR max vel: 3.58 cm2
AV Area VTI: 2.97 cm2
AV Area mean vel: 3.52 cm2
AV Mean grad: 3 mmHg
AV Peak grad: 5.4 mmHg
Ao pk vel: 1.16 m/s
Calc EF: 35.1 %
S' Lateral: 3.1 cm
Single Plane A2C EF: 37.6 %
Single Plane A4C EF: 35.6 %

## 2020-04-26 NOTE — Progress Notes (Signed)
  Echocardiogram 2D Echocardiogram has been performed.  Steven Frazier F 04/26/2020, 9:05 AM

## 2020-05-02 NOTE — Progress Notes (Signed)
Advanced Heart Failure Clinic Note    PCP: Trey Sailors, PA PCP-Cardiologist: Dr. Haroldine Laws   Reason for Visit: Heart Failure   HPI: Steven Frazier is a 65 y.o. male from [place] (emigrated [place]) with systolic HF (EF 79-89%), HTN, and hypothyroidism.    Admitted 8/21 with hypervolemic hyponatremia in the setting of systolic HF with Na 211. Also noted to have unintentional significant weight loss (~20-25 pounds), low albumin, microcytic anemia and eosinophilia. Work-up including peripheral smear, HIV, hepatitis and parasitic screens negative.    Readmitted 10/21 with marked volume overload. Had [facility] showing severe NICM EF 20%, with normal coronaries and mildly elevated filling pressures w/ normal cardiac output. Found to have adrenal insuffiencey. Cortisol level 0.4. D/w endocrinology who recommended hydrocortisone 10 mg qam/ 5 mg pm + brain MRI w/ pituitary protocol. MRI was unremarkable.   Today he returns for HF follow up. Last visit torsemide was increased to 40 mg daily. Overall feeling fine. Denies SOB/PND/Orthopnea. Able to cut his grass with a push mower. Appetite ok. No fever or chills. Weight at home 137- 139 pounds. Taking all medications.   Cardiac Studies  Echo 03/2020 EF 45-50%  Echo 8/21: EF 40%. RV mildly reduced   R/LHC 10/21:  Ao = 123/72 (94) LV = 122/15 RA = 9 RV = 48/12 PA = 49/17 (31) PCW = 22 (v = 31) Fick cardiac output/index = 4.1/2.4 PVR = 2.2 WU  FA sat = 99% PA sat = 68%, 69% PAPi = 3.5  Assessment: 1. Severe NICM EF 20% 2. Normal coronaries 3. Mildly elevated filling pressures with normal CO  cMRI 10/21 IMPRESSION: 1. Diffuse subcutaneous and mesenteric edema with small amount of ascites along the paracolic gutters and mild perihepatic and perisplenic ascites. The appearance is compatible with third spacing of fluid and may be related to the patient's hypoalbuminemia and hypoproteinemia. 2. Mild to moderate cardiomegaly. 3. Small  left and moderate right pleural effusions with passive atelectasis. 4. Prominence of the lateral segment left hepatic lobe could be a sign of early cirrhosis. 5. Nonunited fractures of the left ninth, tenth, and eleventh ribs posterolaterally with a partially healed fracture of the left twelfth rib posterolaterally. 6. Disc bulges at L3-4, L4-5, and L5-S1. 7. Small left hydrocele along the spermatic cord. 8. Aortic atherosclerosis.  Review of systems complete and found to be negative unless listed in HPI.     Past Medical History:  Diagnosis Date  . CHF (congestive heart failure) ([place])   . Heart failure ([place])   . Hypertension   . Hyponatremia   . Hypothyroidism   . Shoulder pain     Current Outpatient Medications  Medication Sig Dispense Refill  . aspirin EC 81 MG EC tablet Take 1 tablet (81 mg total) by mouth daily. Swallow whole. 30 tablet 11  . dapagliflozin propanediol (FARXIGA) 10 MG TABS tablet Take 1 tablet (10 mg total) by mouth daily before breakfast. 30 tablet 3  . doxycycline (VIBRAMYCIN) 50 MG capsule Take 50 mg by mouth 2 (two) times daily.    . folic acid (FOLVITE) 1 MG tablet Take 1 tablet (1 mg total) by mouth daily. 30 tablet 0  . hydrocortisone (CORTEF) 10 MG tablet Take 1 tablet (10 mg total) by mouth daily with breakfast. 100 tablet 3  . hydrocortisone (CORTEF) 5 MG tablet Take 1 tablet (5 mg total) by mouth every evening. 100 tablet 3  . isosorbide-hydrALAZINE (BIDIL) 20-37.5 MG tablet TAKE 1 TABLET BY MOUTH 3 (  THREE) TIMES DAILY. 90 tablet 11  . levothyroxine (SYNTHROID) 50 MCG tablet Take 1 tablet (50 mcg total) by mouth daily. 90 tablet 3  . MULTIPLE VITAMIN PO Take 1 tablet by mouth daily.    Marland Kitchen neomycin-polymyxin-dexameth (MAXITROL) 0.1 % OINT 1 application 4 (four) times daily. For 7 days    . rosuvastatin (CRESTOR) 10 MG tablet TAKE 1 TABLET (10 MG TOTAL) BY MOUTH DAILY. 30 tablet 5  . sacubitril-valsartan (ENTRESTO) 97-103 MG Take 1 tablet by mouth 2  (two) times daily.    Marland Kitchen spironolactone (ALDACTONE) 50 MG tablet Take 1 tablet (50 mg total) by mouth at bedtime. 30 tablet 6  . torsemide (DEMADEX) 20 MG tablet Take 2 tablets (40 mg total) by mouth daily. 60 tablet 6   No current facility-administered medications for this encounter.    No Known Allergies    Social History   Socioeconomic History  . Marital status: Married    Spouse name: Not on file  . Number of children: Not on file  . Years of education: Not on file  . Highest education level: Not on file  Occupational History  . Not on file  Tobacco Use  . Smoking status: Never Smoker  . Smokeless tobacco: Never Used  Substance and Sexual Activity  . Alcohol use: Not Currently  . Drug use: Never  . Sexual activity: Not on file  Other Topics Concern  . Not on file  Social History Narrative  . Not on file   Social Determinants of Health   Financial Resource Strain: Not on file  Food Insecurity: No Food Insecurity  . Worried About Charity fundraiser in the Last Year: Never true  . Ran Out of Food in the Last Year: Never true  Transportation Needs: No Transportation Needs  . Lack of Transportation (Medical): No  . Lack of Transportation (Non-Medical): No  Physical Activity: Not on file  Stress: Not on file  Social Connections: Not on file  Intimate Partner Violence: Not on file     No family history on file.  Vitals:   05/03/20 1030  BP: 114/80  Pulse: 98  SpO2: 94%  Weight: 63.7 kg (140 lb 6.4 oz)   Wt Readings from Last 3 Encounters:  05/03/20 63.7 kg  04/04/20 65 kg  02/09/20 62.8 kg     PHYSICAL EXAM: General:  Well appearing. No resp difficulty HEENT: normal Neck: supple. no JVD. Carotids 2+ bilat; no bruits. No lymphadenopathy or thryomegaly appreciated. Cor: PMI nondisplaced. Regular rate & rhythm. No rubs, gallops or murmurs. Lungs: clear Abdomen: soft, nontender, nondistended. No hepatosplenomegaly. No bruits or masses. Good bowel  sounds. Extremities: no cyanosis, clubbing, rash, edema Neuro: alert & orientedx3, cranial nerves grossly intact. moves all 4 extremities w/o difficulty. Affect pleasant  EKG: SR with 1st degree heart block 98 bpm with occasional PVCs   ASSESSMENT & PLAN:  1.  Chronic Systolic Heart Failure - Echo 8/21 showed EF 40%. RV mildly reduced ---> ECHO 03/2020 EF 45-50%  - R/LHC 10/21 showed severe NICM EF 20%, with normal coronaries and mildly elevated filling pressures w/ normal cardiac output. - cMRI suggestive of previous myocarditis vs infiltrative. EF 37% - PYP visual and quantitative assessment (grade 1 and H/CL = 1.18) are equivocal for transthyretin amyloidosis => probably not TTR amyloidosis. - auto-immune serologies negative. ESR 5, SPEP/serum light chains negative - ? secondary adrenocortical insufficiency as etiology of HF. Serum cortisol level was markedly low at 0.4 (10/21)>>now on hydrocortisone  10 mg qam/ 5 mg pm. Brain MRI unrevealing - NYHA II. -Volume status  Stable. Continue torsemide to 40 daily.  -Continue Spiro 50 mg daily  - Continue Farxiga 10 mg daily - Continue Entresto 97-103 mg bid.    - Continue Bidil 1 tablet tid  - avoiding ? blocker w/ marked 1st degree AVB - Check BMET   2. Adrenal Insuffiencey  - Serum Cortisol markedly low 0.4 10/21  - brain MRI w/ pituitary protocol unremarkable. - C/w hydrocortisone 10 mg qam/ 5 mg pm,  - Followed by  Endocrinology.  3. HTN -Stable.   Follow up with Dr Haroldine Laws in 3 months. Check BMET   Darrick Grinder, NP 05/03/20

## 2020-05-03 ENCOUNTER — Encounter (HOSPITAL_COMMUNITY): Payer: Self-pay

## 2020-05-03 ENCOUNTER — Ambulatory Visit (HOSPITAL_COMMUNITY)
Admission: RE | Admit: 2020-05-03 | Discharge: 2020-05-03 | Disposition: A | Discharge status: Home / Self Care | Payer: Medicaid Other | Source: Ambulatory Visit | Attending: Adult Health | Admitting: Adult Health

## 2020-05-03 ENCOUNTER — Other Ambulatory Visit: Payer: Self-pay

## 2020-05-03 VITALS — BP 114/80 | HR 98 | Wt 140.4 lb

## 2020-05-03 DIAGNOSIS — I10 Essential (primary) hypertension: Secondary | ICD-10-CM

## 2020-05-03 DIAGNOSIS — Z79899 Other long term (current) drug therapy: Secondary | ICD-10-CM | POA: Diagnosis not present

## 2020-05-03 DIAGNOSIS — D721 Eosinophilia, unspecified: Secondary | ICD-10-CM | POA: Insufficient documentation

## 2020-05-03 DIAGNOSIS — I5022 Chronic systolic (congestive) heart failure: Secondary | ICD-10-CM | POA: Diagnosis present

## 2020-05-03 DIAGNOSIS — I110 Hypertensive heart disease with heart failure: Secondary | ICD-10-CM | POA: Diagnosis not present

## 2020-05-03 DIAGNOSIS — R634 Abnormal weight loss: Secondary | ICD-10-CM | POA: Insufficient documentation

## 2020-05-03 DIAGNOSIS — D509 Iron deficiency anemia, unspecified: Secondary | ICD-10-CM | POA: Diagnosis not present

## 2020-05-03 DIAGNOSIS — E039 Hypothyroidism, unspecified: Secondary | ICD-10-CM | POA: Diagnosis not present

## 2020-05-03 DIAGNOSIS — Z7982 Long term (current) use of aspirin: Secondary | ICD-10-CM | POA: Diagnosis not present

## 2020-05-03 DIAGNOSIS — I428 Other cardiomyopathies: Secondary | ICD-10-CM | POA: Insufficient documentation

## 2020-05-03 DIAGNOSIS — Z7984 Long term (current) use of oral hypoglycemic drugs: Secondary | ICD-10-CM | POA: Insufficient documentation

## 2020-05-03 LAB — BASIC METABOLIC PANEL
Anion gap: 8 (ref 5–15)
BUN: 19 mg/dL (ref 8–23)
CO2: 28 mmol/L (ref 22–32)
Calcium: 8.8 mg/dL — ABNORMAL LOW (ref 8.9–10.3)
Chloride: 100 mmol/L (ref 98–111)
Creatinine, Ser: 1.34 mg/dL — ABNORMAL HIGH (ref 0.61–1.24)
GFR, Estimated: 59 mL/min — ABNORMAL LOW (ref >60)
Glucose, Bld: 157 mg/dL — ABNORMAL HIGH (ref 70–99)
Potassium: 3.9 mmol/L (ref 3.5–5.1)
Sodium: 136 mmol/L (ref 135–145)

## 2020-05-03 NOTE — Patient Instructions (Addendum)
It was great to see you today! No medication changes are needed at this time.  Labs today We will only contact you if something comes back abnormal or we need to make some changes. Otherwise no news is good news!   Your physician recommends that you schedule a follow-up appointment in: 3 months Dr Gala Romney  Do the following things EVERYDAY: 1) Weigh yourself in the morning before breakfast. Write it down and keep it in a log. 2) Take your medicines as prescribed 3) Eat low salt foods--Limit salt (sodium) to 2000 mg per day.  4) Stay as active as you can everyday 5) Limit all fluids for the day to less than 2 liters  At the [facility], you and your health needs are our priority. As part of our continuing mission to provide you with exceptional heart care, we have created designated Provider Care Teams. These Care Teams include your primary Cardiologist (physician) and Advanced Practice Providers (APPs- Physician Assistants and Nurse Practitioners) who all work together to provide you with the care you need, when you need it.   You may see any of the following providers on your designated Care Team at your next follow up: Marland Kitchen Dr Arvilla Meres . Dr Marca Ancona . Dr Thornell Mule . Tonye Becket, NP . Robbie Lis, PA . Shanda Bumps [place],NP . Karle Plumber, PharmD   Please be sure to bring in all your medications bottles to every appointment.

## 2020-05-09 ENCOUNTER — Other Ambulatory Visit (HOSPITAL_COMMUNITY): Payer: Self-pay

## 2020-05-09 MED ORDER — HYDROCORTISONE 5 MG PO TABS
5.0000 mg | ORAL_TABLET | Freq: Every evening | ORAL | 2 refills | Status: DC
  Filled 2020-05-09: qty 30, 30d supply, fill #0
  Filled 2020-06-06: qty 30, 30d supply, fill #1

## 2020-05-09 MED ORDER — SPIRONOLACTONE 25 MG PO TABS
25.0000 mg | ORAL_TABLET | Freq: Every day | ORAL | 2 refills | Status: DC
  Filled 2020-05-09: qty 30, 30d supply, fill #0

## 2020-05-09 MED ORDER — HYDROCORTISONE 10 MG PO TABS
10.0000 mg | ORAL_TABLET | Freq: Every morning | ORAL | 2 refills | Status: DC
  Filled 2020-05-09: qty 30, 30d supply, fill #0
  Filled 2020-06-06: qty 30, 30d supply, fill #1

## 2020-05-09 MED FILL — Isosorbide Dinitrate-Hydralazine HCl Tab 20-37.5 MG: ORAL | 30 days supply | Qty: 90 | Fill #0 | Status: AC

## 2020-05-20 ENCOUNTER — Other Ambulatory Visit (HOSPITAL_COMMUNITY): Payer: Self-pay | Admitting: Internal Medicine

## 2020-05-20 ENCOUNTER — Other Ambulatory Visit (HOSPITAL_COMMUNITY): Payer: Self-pay

## 2020-05-20 MED ORDER — LEVOTHYROXINE SODIUM 50 MCG PO TABS
50.0000 ug | ORAL_TABLET | Freq: Every day | ORAL | 3 refills | Status: DC
  Filled 2020-05-20: qty 30, 30d supply, fill #0
  Filled 2020-08-16: qty 30, 30d supply, fill #1

## 2020-05-23 ENCOUNTER — Other Ambulatory Visit (HOSPITAL_COMMUNITY): Payer: Self-pay

## 2020-06-06 MED FILL — Rosuvastatin Calcium Tab 10 MG: ORAL | 30 days supply | Qty: 30 | Fill #0 | Status: AC

## 2020-06-06 MED FILL — Isosorbide Dinitrate-Hydralazine HCl Tab 20-37.5 MG: ORAL | 30 days supply | Qty: 90 | Fill #1 | Status: AC

## 2020-06-07 ENCOUNTER — Other Ambulatory Visit (HOSPITAL_COMMUNITY): Payer: Self-pay

## 2020-06-08 ENCOUNTER — Other Ambulatory Visit: Payer: Self-pay

## 2020-06-08 ENCOUNTER — Encounter: Payer: Self-pay | Admitting: Internal Medicine

## 2020-06-08 ENCOUNTER — Ambulatory Visit: Payer: Medicaid Other | Admitting: Internal Medicine

## 2020-06-08 VITALS — BP 134/80 | HR 90 | Ht 64.0 in | Wt 144.2 lb

## 2020-06-08 DIAGNOSIS — E2740 Unspecified adrenocortical insufficiency: Secondary | ICD-10-CM | POA: Diagnosis not present

## 2020-06-08 DIAGNOSIS — E039 Hypothyroidism, unspecified: Secondary | ICD-10-CM | POA: Diagnosis not present

## 2020-06-08 LAB — COMPREHENSIVE METABOLIC PANEL
ALT: 47 U/L (ref 0–53)
AST: 42 U/L — ABNORMAL HIGH (ref 0–37)
Albumin: 4.1 g/dL (ref 3.5–5.2)
Alkaline Phosphatase: 79 U/L (ref 39–117)
BUN: 24 mg/dL — ABNORMAL HIGH (ref 6–23)
CO2: 33 mEq/L — ABNORMAL HIGH (ref 19–32)
Calcium: 9.3 mg/dL (ref 8.4–10.5)
Chloride: 100 mEq/L (ref 96–112)
Creatinine, Ser: 1.6 mg/dL — ABNORMAL HIGH (ref 0.40–1.50)
GFR: 45.34 mL/min — ABNORMAL LOW (ref >60.00)
Glucose, Bld: 108 mg/dL — ABNORMAL HIGH (ref 70–99)
Potassium: 4.8 mEq/L (ref 3.5–5.1)
Sodium: 140 mEq/L (ref 135–145)
Total Bilirubin: 0.3 mg/dL (ref 0.2–1.2)
Total Protein: 7.6 g/dL (ref 6.0–8.3)

## 2020-06-08 LAB — LUTEINIZING HORMONE: LH: 12.81 m[IU]/mL — ABNORMAL HIGH (ref 1.50–9.30)

## 2020-06-08 LAB — TSH: TSH: 1.46 u[IU]/mL (ref 0.35–4.50)

## 2020-06-08 LAB — T4, FREE: Free T4: 0.86 ng/dL (ref 0.60–1.60)

## 2020-06-08 NOTE — Patient Instructions (Signed)
-   Continue Hydrocortisone 10 mg, 1 tablet  with Breakfast  - Continue Hydrocortisone 5 mg, 1 tablet between 2 and 4 pm  - Continue levothyroxine 50 mcg daily        - ADRENAL INSUFFICIENCY SICK DAY RULES:  Should you face an extreme emotional or physical stress such as trauma, surgery or acute illness, this will require extra steroid coverage so that the body can meet that stress.   Without increasing the steroid dose you may experience severe weakness, headache, dizziness, nausea and vomiting and possibly a more serious deterioration in health.  Typically the dose of steroids will only need to be increased for a couple of days if you have an illness that is transient and managed in the community.   If you are unable to take/absorb an increased dose of steroids orally because of vomiting or diarrhea, you will urgently require steroid injections and should present to an Emergency Department.  The general advice for any serious illness is as follows: 1. Double the normal daily steroid dose for up to 3 days if you have a temperature of more than 37.50C (99.88F) with signs of sickness, or severe emotional or physical distress 2. Contact your primary care doctor and Endocrinologist if the illness worsens or it lasts for more than 3 days.  3. In cases of severe illness, urgent medical assistance should be promptly sought. 4. If you experience vomiting/diarrhea or are unable to take steroids by mouth, please present to emergency room

## 2020-06-08 NOTE — Progress Notes (Signed)
Name: Steven Frazier  MRN/ DOB: [id number], 07-03-1955    Age/ Sex: 65 y.o., male     PCP: Norm Salt, PA   Reason for Endocrinology Evaluation: Secondary adrenal insufficieincy     Initial Endocrinology Clinic Visit: 02/08/2020    PATIENT IDENTIFIER: Steven Frazier is a 65 y.o., male with a past medical history of CHF and HTN . He has followed with [facility] since 02/08/2120 for consultative assistance with management of his adrenal insufficiency.   HISTORICAL SUMMARY:   Pt presented to the ED in 08/2019 with hypervolemic hyponatremia ( Sodium 117 mmol/L) with low albumin at < 2 g/dL, low serum osmolality at 245 mOsm/Kg , TSH slightly elevated at 9.4 uIU/mL . Urine sodium was 26 mg/dL with a urine osmolality at 284 .  Pt had a random serum cortisol of 0.4 ug/dL on 78/29/5621 and was started on hydrocortisone  ACTH was undetectable in 01/2020    Moved from [place] , [place] ~ 2020  No prior use of steroids    SUBJECTIVE:   Today (06/08/2020):  Steven Frazier is here for adrenal insufficiency. H eis accompanied by his son today.    He denies dizziness Has denies constipation and diarrhea  He denies weight loss     Has been wearing a medical alert bracelet   HC 10 mg, 1 tablet with Breakfast  HC 5 mg, 1 tablet between 2-4 pm     HISTORY:  Past Medical History:  Past Medical History:  Diagnosis Date  . CHF (congestive heart failure) ([facility])   . Heart failure ([facility])   . Hypertension   . Hyponatremia   . Hypothyroidism   . Shoulder pain    Past Surgical History:  Past Surgical History:  Procedure Laterality Date  . HERNIA REPAIR    . NO PAST SURGERIES    . RIGHT/LEFT HEART CATH AND CORONARY ANGIOGRAPHY N/A 11/16/2019   Procedure: RIGHT/LEFT HEART CATH AND CORONARY ANGIOGRAPHY;  Surgeon: Dolores Patty, MD;  Location: [facility];  Service: Cardiovascular;  Laterality: N/A;    Social History:  reports that he has never smoked. He has  never used smokeless tobacco. He reports previous alcohol use. He reports that he does not use drugs.  Family History: No family history on file.   HOME MEDICATIONS: Allergies as of 06/08/2020   No Known Allergies     Medication List       Accurate as of Jun 08, 2020  9:58 AM. If you have any questions, ask your nurse or doctor.        STOP taking these medications   neomycin-polymyxin-dexameth 0.1 % Oint Commonly known as: MAXITROL Stopped by: Scarlette Shorts, MD     TAKE these medications   aspirin 81 MG EC tablet Take 1 tablet (81 mg total) by mouth daily. Swallow whole.   BiDil 20-37.5 MG tablet Generic drug: isosorbide-hydrALAZINE TAKE 1 TABLET BY MOUTH 3 (THREE) TIMES DAILY.   dapagliflozin propanediol 10 MG Tabs tablet Commonly known as: Farxiga Take 1 tablet (10 mg total) by mouth daily before breakfast.   doxycycline 50 MG capsule Commonly known as: VIBRAMYCIN Take 50 mg by mouth 2 (two) times daily.   Entresto 97-103 MG Generic drug: sacubitril-valsartan Take 1 tablet by mouth 2 (two) times daily.   folic acid 1 MG tablet Commonly known as: FOLVITE Take 1 tablet (1 mg total) by mouth daily.   hydrocortisone 10 MG tablet Commonly known as: CORTEF Take 1  tablet (10 mg total) by mouth every morning.   hydrocortisone 5 MG tablet Commonly known as: CORTEF Take 1 tablet (5 mg total) by mouth every evening.   hydrocortisone 10 MG tablet Commonly known as: CORTEF Take 1 tablet (10 mg total) by mouth daily with breakfast.   hydrocortisone 5 MG tablet Commonly known as: CORTEF Take 1 tablet (5 mg total) by mouth every evening.   levothyroxine 50 MCG tablet Commonly known as: SYNTHROID Take 1 tablet (50 mcg total) by mouth daily.   levothyroxine 50 MCG tablet Commonly known as: SYNTHROID Take 1 tablet (50 mcg total) by mouth daily.   MULTIPLE VITAMIN PO Take 1 tablet by mouth daily.   rosuvastatin 10 MG tablet Commonly known as:  CRESTOR TAKE 1 TABLET (10 MG TOTAL) BY MOUTH DAILY.   spironolactone 25 MG tablet Commonly known as: ALDACTONE Take 1 tablet (25 mg total) by mouth at bedtime.   spironolactone 50 MG tablet Commonly known as: ALDACTONE Take 1 tablet (50 mg total) by mouth at bedtime.   torsemide 20 MG tablet Commonly known as: DEMADEX Take 2 tablets (40 mg total) by mouth daily.         OBJECTIVE:   PHYSICAL EXAM: VS: BP 134/80   Pulse 90   Ht 5\' 4"  (1.626 m)   Wt 144 lb 4 oz (65.4 kg)   SpO2 98%   BMI 24.76 kg/m    EXAM: General: Pt appears well and is in NAD  Neck: General: Supple without adenopathy. Thyroid: Thyroid size normal.  No goiter or nodules appreciated.   Lungs: Clear with good BS bilat with no rales, rhonchi, or wheezes  Heart: Auscultation: RRR.  Abdomen: Normoactive bowel sounds, soft, nontender, without masses or organomegaly palpable  Extremities:  BL LE: No pretibial edema normal ROM and strength.  Mental Status: Judgment, insight: Intact Memory: Intact for recent and remote events Mood and affect: No depression, anxiety, or agitation     DATA REVIEWED:  Results for Steven Frazier, Steven Frazier (MRN Lendon Ka) as of 06/09/2020 08:45  Ref. Range 06/08/2020 10:16  Sodium Latest Ref Range: 135 - 145 mEq/L 140  Potassium Latest Ref Range: 3.5 - 5.1 mEq/L 4.8  Chloride Latest Ref Range: 96 - 112 mEq/L 100  CO2 Latest Ref Range: 19 - 32 mEq/L 33 (H)  Glucose Latest Ref Range: 70 - 99 mg/dL 08/08/2020 (H)  BUN Latest Ref Range: 6 - 23 mg/dL 24 (H)  Creatinine Latest Ref Range: 0.40 - 1.50 mg/dL 557 (H)  Calcium Latest Ref Range: 8.4 - 10.5 mg/dL 9.3  Alkaline Phosphatase Latest Ref Range: 39 - 117 U/L 79  Albumin Latest Ref Range: 3.5 - 5.2 g/dL 4.1  AST Latest Ref Range: 0 - 37 U/L 42 (H)  ALT Latest Ref Range: 0 - 53 U/L 47  Total Protein Latest Ref Range: 6.0 - 8.3 g/dL 7.6  Total Bilirubin Latest Ref Range: 0.2 - 1.2 mg/dL 0.3  GFR Latest Ref Range: >60.00 mL/min 45.34 (L)  LH  Latest Ref Range: 1.50 - 9.30 mIU/mL 12.81 (H)  Prolactin Latest Ref Range: 2.0 - 18.0 ng/mL 6.9  TSH Latest Ref Range: 0.35 - 4.50 uIU/mL 1.46  T4,Free(Direct) Latest Ref Range: 0.60 - 1.60 ng/dL 3.22     CT Scan 0.25   Cardiovascular: Atherosclerotic calcification of the aortic arch. Mild to moderate cardiomegaly.  Mediastinum/Nodes: Prevascular lymph node 0.9 cm in short axis on image 17/3. Right paratracheal lymph node 0.8 cm in short axis on image 18 of  series 3. These lymph nodes are indistinct and may be upper normal in size due to passive congestion. Similar scattered small indistinct axillary lymph nodes.  Lungs/Pleura: Small left and moderate right pleural effusion with passive atelectasis. Minimal scattered subsegmental atelectasis in the lungs.  Musculoskeletal: Diffuse subcutaneous edema.  Nonunited fractures of the ninth, tenth, and eleventh ribs posterolaterally with a partially healed fracture of the left twelfth rib posterolaterally.   MRI 11/16/2019 FINDINGS: Brain: Diffusion imaging does not show any acute or subacute infarction. No abnormality affects the brainstem or cerebellum. Cerebral hemispheres show moderate chronic small-vessel ischemic changes of the deep white matter. No cortical or large vessel territory infarction. No evidence of primary or metastatic mass lesion, hemorrhage, hydrocephalus or extra-axial collection. No abnormal brain or leptomeningeal enhancement occurs. No abnormality of the hypothalamus or pituitary gland.  Vascular: Major vessels at the base of the brain show flow.  Skull and upper cervical spine: Considerable degenerative changes of the upper cervical spine. Some degree of spinal stenosis at C2-3, not primarily or completely evaluated.  Sinuses/Orbits: Clear sinuses.  Previous lens implant on the right.  Other: None  IMPRESSION: 1. No acute or reversible finding. No evidence of  metastatic disease. 2. Moderate chronic small-vessel ischemic changes of the cerebral hemispheric deep white matter. 3. Considerable degenerative changes of the upper cervical spine. Some degree of spinal stenosis at C2-3, not primarily or completely evaluated.  ASSESSMENT / PLAN / RECOMMENDATIONS:   1. Secondary Adrenal Insufficiency:  - Pt has been on hydrocortisone since 10/2019 with a low random serum cortisol of 0.4 ug/dL - He already has a medical alert bracelet - Recent BMP shows normal K and Na, stable GFR - Discussed sick day rule  - Will check for other pituitary hormones LH slightly elevated , awaiting on testosterone . Prolactin is normal    Medications HC 10 mg, 1 tablet with Breakfast  HC 5 mg, 1 tablet between 2-4 pm     2. Hypothyroidism:   - Pt is clinically euthyroid  - TSH and FT4 normal    Medications : Continue Levothyroxine 50 mcg daily    Signed electronically by: Lyndle Herrlich, MD  [facility] End[facility]  [facility] [address] [place] [place], [place] [address] Phone: [phone] FAX: [phone]      CC: Norm Salt, [place] [address] [place] [address] Phone: [phone]  Fax: [phone]   Return to Endocrinology clinic as below: Future Appointments  Date Time Provider Department Center  08/17/2020  1:40 PM Bensimhon, Bevelyn Buckles, MD [facility] None

## 2020-06-10 LAB — PROLACTIN: Prolactin: 6.9 ng/mL (ref 2.0–18.0)

## 2020-06-10 LAB — TESTOSTERONE, TOTAL, LC/MS/MS: Testosterone, Total, LC-MS-MS: 341 ng/dL (ref 250–1100)

## 2020-06-15 ENCOUNTER — Encounter: Payer: Self-pay | Admitting: Internal Medicine

## 2020-06-20 ENCOUNTER — Other Ambulatory Visit (HOSPITAL_COMMUNITY): Payer: Self-pay | Admitting: Cardiology

## 2020-06-21 NOTE — Telephone Encounter (Signed)
This is a CHF pt 

## 2020-07-07 ENCOUNTER — Other Ambulatory Visit (HOSPITAL_COMMUNITY): Payer: Self-pay

## 2020-07-07 MED FILL — Isosorbide Dinitrate-Hydralazine HCl Tab 20-37.5 MG: ORAL | 30 days supply | Qty: 90 | Fill #2 | Status: AC

## 2020-07-07 MED FILL — Rosuvastatin Calcium Tab 10 MG: ORAL | 30 days supply | Qty: 30 | Fill #1 | Status: AC

## 2020-08-05 ENCOUNTER — Other Ambulatory Visit (HOSPITAL_COMMUNITY): Payer: Self-pay

## 2020-08-05 MED FILL — Isosorbide Dinitrate-Hydralazine HCl Tab 20-37.5 MG: ORAL | 30 days supply | Qty: 90 | Fill #3 | Status: AC

## 2020-08-15 ENCOUNTER — Other Ambulatory Visit (HOSPITAL_COMMUNITY): Payer: Self-pay

## 2020-08-15 MED FILL — Rosuvastatin Calcium Tab 10 MG: ORAL | 30 days supply | Qty: 30 | Fill #2 | Status: AC

## 2020-08-17 ENCOUNTER — Other Ambulatory Visit (HOSPITAL_COMMUNITY): Payer: Self-pay

## 2020-08-17 ENCOUNTER — Other Ambulatory Visit: Payer: Self-pay

## 2020-08-17 ENCOUNTER — Encounter (HOSPITAL_COMMUNITY): Payer: Self-pay | Admitting: Internal Medicine

## 2020-08-17 ENCOUNTER — Ambulatory Visit (HOSPITAL_COMMUNITY)
Admission: RE | Admit: 2020-08-17 | Discharge: 2020-08-17 | Disposition: A | Discharge status: Home / Self Care | Payer: Medicaid Other | Source: Ambulatory Visit | Attending: Internal Medicine | Admitting: Internal Medicine

## 2020-08-17 VITALS — BP 112/70 | HR 95 | Wt 149.6 lb

## 2020-08-17 DIAGNOSIS — I428 Other cardiomyopathies: Secondary | ICD-10-CM | POA: Insufficient documentation

## 2020-08-17 DIAGNOSIS — Z79899 Other long term (current) drug therapy: Secondary | ICD-10-CM | POA: Insufficient documentation

## 2020-08-17 DIAGNOSIS — I5022 Chronic systolic (congestive) heart failure: Secondary | ICD-10-CM | POA: Diagnosis present

## 2020-08-17 DIAGNOSIS — Z79890 Hormone replacement therapy (postmenopausal): Secondary | ICD-10-CM | POA: Insufficient documentation

## 2020-08-17 DIAGNOSIS — E2740 Unspecified adrenocortical insufficiency: Secondary | ICD-10-CM | POA: Insufficient documentation

## 2020-08-17 DIAGNOSIS — Z7982 Long term (current) use of aspirin: Secondary | ICD-10-CM | POA: Diagnosis not present

## 2020-08-17 DIAGNOSIS — E039 Hypothyroidism, unspecified: Secondary | ICD-10-CM | POA: Diagnosis not present

## 2020-08-17 DIAGNOSIS — I110 Hypertensive heart disease with heart failure: Secondary | ICD-10-CM | POA: Insufficient documentation

## 2020-08-17 DIAGNOSIS — I440 Atrioventricular block, first degree: Secondary | ICD-10-CM | POA: Diagnosis not present

## 2020-08-17 DIAGNOSIS — I10 Essential (primary) hypertension: Secondary | ICD-10-CM | POA: Diagnosis not present

## 2020-08-17 LAB — BASIC METABOLIC PANEL
Anion gap: 9 (ref 5–15)
BUN: 13 mg/dL (ref 8–23)
CO2: 26 mmol/L (ref 22–32)
Calcium: 8.8 mg/dL — ABNORMAL LOW (ref 8.9–10.3)
Chloride: 102 mmol/L (ref 98–111)
Creatinine, Ser: 1.61 mg/dL — ABNORMAL HIGH (ref 0.61–1.24)
GFR, Estimated: 47 mL/min — ABNORMAL LOW (ref >60)
Glucose, Bld: 100 mg/dL — ABNORMAL HIGH (ref 70–99)
Potassium: 4.6 mmol/L (ref 3.5–5.1)
Sodium: 137 mmol/L (ref 135–145)

## 2020-08-17 LAB — CBC
HCT: 52.2 % — ABNORMAL HIGH (ref 39.0–52.0)
Hemoglobin: 16.9 g/dL (ref 13.0–17.0)
MCH: 27 pg (ref 26.0–34.0)
MCHC: 32.4 g/dL (ref 30.0–36.0)
MCV: 83.3 fL (ref 80.0–100.0)
Platelets: 286 10*3/uL (ref 150–400)
RBC: 6.27 MIL/uL — ABNORMAL HIGH (ref 4.22–5.81)
RDW: 14.9 % (ref 11.5–15.5)
WBC: 7.9 10*3/uL (ref 4.0–10.5)
nRBC: 0 % (ref 0.0–0.2)

## 2020-08-17 LAB — BRAIN NATRIURETIC PEPTIDE: B Natriuretic Peptide: 165.8 pg/mL — ABNORMAL HIGH (ref 0.0–100.0)

## 2020-08-17 MED ORDER — TORSEMIDE 20 MG PO TABS
20.0000 mg | ORAL_TABLET | Freq: Every day | ORAL | 3 refills | Status: DC
  Filled 2020-08-17 – 2020-08-25 (×2): qty 90, 90d supply, fill #0
  Filled 2020-11-19: qty 90, 90d supply, fill #1
  Filled 2021-02-02: qty 90, 90d supply, fill #2

## 2020-08-17 MED ORDER — SPIRONOLACTONE 25 MG PO TABS
25.0000 mg | ORAL_TABLET | Freq: Every day | ORAL | 3 refills | Status: DC
  Filled 2020-08-17: qty 90, 90d supply, fill #0

## 2020-08-17 NOTE — Patient Instructions (Addendum)
Labs done today. We will contact you only if your labs are abnormal.  DECREASE Torsemide to 20mg  (1 tablet) by mouth daily.  DECREASE Spironolactone to 25mg  (1 tablet) by mouth daily.   No other medication changes were made. Please continue all current medications as prescribed.  Your physician has requested that you have a cardiac MRI in 3 months. Cardiac MRI uses a computer to create images of your heart as its beating, producing both still and moving pictures of your heart and major blood vessels. This has to be approved through your insurance company prior to scheduling, once approved we will contact you to schedule an appointment.   Your physician recommends that you schedule a follow-up appointment in: 4 months  If you have any questions or concerns before your next appointment please send a message through [place] or call our office at [phone].    TO LEAVE A MESSAGE FOR THE NURSE SELECT OPTION 2, PLEASE LEAVE A MESSAGE INCLUDING: YOUR NAME DATE OF BIRTH CALL BACK NUMBER REASON FOR CALL**this is important as we prioritize the call backs  YOU WILL RECEIVE A CALL BACK THE SAME DAY AS LONG AS YOU CALL BEFORE 4:00 PM   Do the following things EVERYDAY: Weigh yourself in the morning before breakfast. Write it down and keep it in a log. Take your medicines as prescribed Eat low salt foods--Limit salt (sodium) to 2000 mg per day.  Stay as active as you can everyday Limit all fluids for the day to less than 2 liters   At the [facility], you and your health needs are our priority. As part of our continuing mission to provide you with exceptional heart care, we have created designated Provider Care Teams. These Care Teams include your primary Cardiologist (physician) and Advanced Practice Providers (APPs- Physician Assistants and Nurse Practitioners) who all work together to provide you with the care you need, when you need it.   You may see any of the  following providers on your designated Care Team at your next follow up: Dr [place] Dr [phone], NP Arvilla Meres, Carron Curie Robbie Lis, PharmD   Please be sure to bring in all your medications bottles to every appointment.

## 2020-08-17 NOTE — Progress Notes (Signed)
Advanced Heart Failure Clinic Note    PCP: Trey Sailors, PA PCP-Cardiologist: Dr. Haroldine Laws   Reason for Visit: Heart Failure   HPI: Steven Frazier is a 65 y.o. male from [place] (emigrated [place]) with systolic HF (EF 93-23%), HTN, and hypothyroidism.    Admitted 8/21 with hypervolemic hyponatremia in the setting of systolic HF with Na 557. Also noted to have unintentional significant weight loss (~20-25 pounds), low albumin, microcytic anemia and eosinophilia. Work-up including peripheral smear, HIV, hepatitis and parasitic screens negative.    Readmitted 10/21 with marked volume overload. Had [facility] showing severe NICM EF 20%, with normal coronaries and mildly elevated filling pressures w/ normal cardiac output. Found to have adrenal insuffiencey. Cortisol level 0.4. D/w endocrinology who recommended hydrocortisone 10 mg qam/ 5 mg pm + brain MRI w/ pituitary protocol. MRI was unremarkable.   Last visit weight 139lbs  torsemide 66m daily continued (was increased the visit before this).    Feeling well, taking his medications.  Denies dypsnea.  Exercising at home, going shopping with his children and no trouble getting around the store.  BP doing well at home around 1322systolic on average.  Weight at home stable around 140's.  No problems with medications. No dizziness.   Cardiac Studies  Echo 03/2020 EF 45-50%  Echo 8/21: EF 40%. RV mildly reduced   R/LHC 10/21:  Ao = 123/72 (94) LV = 122/15 RA = 9 RV = 48/12 PA = 49/17 (31) PCW = 22 (v = 31) Fick cardiac output/index = 4.1/2.4 PVR = 2.2 WU  FA sat = 99% PA sat = 68%, 69% PAPi = 3.5  Assessment: 1. Severe NICM EF 20% 2. Normal coronaries 3. Mildly elevated filling pressures with normal CO  cMRI 10/21 IMPRESSION: 1. Diffuse subcutaneous and mesenteric edema with small amount of ascites along the paracolic gutters and mild perihepatic and perisplenic ascites. The appearance is compatible with third spacing of  fluid and may be related to the patient's hypoalbuminemia and hypoproteinemia. 2. Mild to moderate cardiomegaly. 3. Small left and moderate right pleural effusions with passive atelectasis. 4. Prominence of the lateral segment left hepatic lobe could be a sign of early cirrhosis. 5. Nonunited fractures of the left ninth, tenth, and eleventh ribs posterolaterally with a partially healed fracture of the left twelfth rib posterolaterally. 6. Disc bulges at L3-4, L4-5, and L5-S1. 7. Small left hydrocele along the spermatic cord. 8. Aortic atherosclerosis.  Review of systems complete and found to be negative unless listed in HPI.     Past Medical History:  Diagnosis Date   CHF (congestive heart failure) (HCC)    Heart failure (HCC)    Hypertension    Hyponatremia    Hypothyroidism    Shoulder pain     Current Outpatient Medications  Medication Sig Dispense Refill   aspirin EC 81 MG EC tablet Take 1 tablet (81 mg total) by mouth daily. Swallow whole. 30 tablet 11   doxycycline (VIBRAMYCIN) 50 MG capsule Take 50 mg by mouth 2 (two) times daily.     FARXIGA 10 MG TABS tablet TAKE 1 TABLET BY MOUTH BEFORE BREAKFAST 90 tablet 0   folic acid (FOLVITE) 1 MG tablet Take 1 tablet (1 mg total) by mouth daily. 30 tablet 0   hydrocortisone (CORTEF) 10 MG tablet Take 1 tablet (10 mg total) by mouth daily with breakfast. 100 tablet 3   hydrocortisone (CORTEF) 5 MG tablet Take 1 tablet (5 mg total) by mouth every evening.  100 tablet 3   isosorbide-hydrALAZINE (BIDIL) 20-37.5 MG tablet TAKE 1 TABLET BY MOUTH 3 (THREE) TIMES DAILY. 90 tablet 11   levothyroxine (SYNTHROID) 50 MCG tablet Take 1 tablet (50 mcg total) by mouth daily. 90 tablet 3   MULTIPLE VITAMIN PO Take 1 tablet by mouth daily.     rosuvastatin (CRESTOR) 10 MG tablet TAKE 1 TABLET (10 MG TOTAL) BY MOUTH DAILY. 30 tablet 5   sacubitril-valsartan (ENTRESTO) 97-103 MG Take 1 tablet by mouth 2 (two) times daily.     spironolactone  (ALDACTONE) 50 MG tablet Take 1 tablet (50 mg total) by mouth at bedtime. 30 tablet 6   torsemide (DEMADEX) 20 MG tablet Take 2 tablets (40 mg total) by mouth daily. 60 tablet 6   No current facility-administered medications for this encounter.    No Known Allergies    Social History   Socioeconomic History   Marital status: Married    Spouse name: Not on file   Number of children: Not on file   Years of education: Not on file   Highest education level: Not on file  Occupational History   Not on file  Tobacco Use   Smoking status: Never   Smokeless tobacco: Never  Substance and Sexual Activity   Alcohol use: Not Currently   Drug use: Never   Sexual activity: Not on file  Other Topics Concern   Not on file  Social History Narrative   Not on file   Social Determinants of Health   Financial Resource Strain: Not on file  Food Insecurity: No Food Insecurity   Worried About Running Out of Food in the Last Year: Never true   [place] in the Last Year: Never true  Transportation Needs: No Transportation Needs   Lack of Transportation (Medical): No   Lack of Transportation (Non-Medical): No  Physical Activity: Not on file  Stress: Not on file  Social Connections: Not on file  Intimate Partner Violence: Not on file     History reviewed. No pertinent family history.  Vitals:   08/17/20 1356  BP: 112/70  Pulse: 95  SpO2: 95%  Weight: 67.9 kg (149 lb 9.6 oz)   Wt Readings from Last 3 Encounters:  08/17/20 67.9 kg (149 lb 9.6 oz)  06/08/20 65.4 kg (144 lb 4 oz)  05/03/20 63.7 kg (140 lb 6.4 oz)     PHYSICAL EXAM: General:  Well appearing. No resp difficulty HEENT: normal Neck: supple. no JVD. Carotids 2+ bilat; no bruits. No lymphadenopathy or thryomegaly appreciated. Cor: PMI nondisplaced. Regular rate & rhythm. No rubs, gallops or murmurs. Lungs: clear Abdomen: soft, nontender, nondistended. No hepatosplenomegaly. No bruits or masses. Good bowel  sounds. Extremities: no cyanosis, clubbing, rash, edema Neuro: alert & orientedx3, cranial nerves grossly intact. moves all 4 extremities w/o difficulty. Affect pleasant   EKG: NSR 91 with marked 1AVB (290 ms) diffuse repolarization abnormalities. Personally reviewed   ASSESSMENT & PLAN:  1.  Chronic Systolic Heart Failure - Echo 8/21 showed EF 40%. RV mildly reduced ---> ECHO 03/2020 EF 45-50%  - R/LHC 10/21 showed severe NICM EF 20%, with normal coronaries and mildly elevated filling pressures w/ normal cardiac output. - cMRI suggestive of previous myocarditis vs infiltrative. EF 37% - PYP visual and quantitative assessment (grade 1 and H/CL = 1.18) are equivocal for transthyretin amyloidosis => probably not TTR amyloidosis. - auto-immune serologies negative. ESR 5, SPEP/serum light chains negative - ? secondary adrenocortical insufficiency as etiology of HF.  Serum cortisol level was markedly low at 0.4 (10/21)>>now on hydrocortisone 10 mg qam/ 5 mg pm. Brain MRI unrevealing - Doing well NYHA I-II - Volume status low to normal Recent SCr  up a bit. ReDS 30% - Cut torsemide from 40 daily to 20 mg daily  - Cut spiro 50 -> 25 daily - Continue Farxiga 10 mg daily - Continue Entresto 97-103 mg bid.    - Continue Bidil 1 tablet tid  - avoiding ? blocker w/ marked 1st degree AVB - Check labs today  - Repeat MRI in 3 months at f/u   2. Adrenal Insuffiencey  - Serum Cortisol markedly low 0.4 10/21  - brain MRI w/ pituitary protocol unremarkable. - C/w hydrocortisone 10 mg qam/ 5 mg pm,  - Followed by  Endocrinology. Last seen im 5/22  3. HTN  -Blood pressure well controlled. Continue current regimen.  4. Marked 1 AVB - stable   Glori Bickers, MD  2:42 PM

## 2020-08-17 NOTE — Progress Notes (Signed)
ReDS Vest / Clip - 08/17/20 1500       ReDS Vest / Clip   Station Marker B    Ruler Value 36    ReDS Value Range Low volume    ReDS Actual Value 30    Anatomical Comments sitting

## 2020-08-25 ENCOUNTER — Other Ambulatory Visit (HOSPITAL_COMMUNITY): Payer: Self-pay

## 2020-08-25 ENCOUNTER — Telehealth (HOSPITAL_COMMUNITY): Payer: Self-pay | Admitting: *Deleted

## 2020-08-25 NOTE — Telephone Encounter (Signed)
Cardiac MRI faxed to [facility].

## 2020-09-04 MED FILL — Isosorbide Dinitrate-Hydralazine HCl Tab 20-37.5 MG: ORAL | 30 days supply | Qty: 90 | Fill #4 | Status: AC

## 2020-09-05 ENCOUNTER — Telehealth (HOSPITAL_COMMUNITY): Payer: Self-pay | Admitting: *Deleted

## 2020-09-05 ENCOUNTER — Other Ambulatory Visit (HOSPITAL_COMMUNITY): Payer: Self-pay

## 2020-09-05 NOTE — Telephone Encounter (Signed)
CM[facility] Berkley Harvey #[id number] exp. 10/24/20  Message sent to Callie Fielding to schedule.

## 2020-09-15 ENCOUNTER — Other Ambulatory Visit (HOSPITAL_COMMUNITY): Payer: Self-pay

## 2020-09-15 ENCOUNTER — Other Ambulatory Visit (HOSPITAL_COMMUNITY): Payer: Self-pay | Admitting: Adult Health

## 2020-09-15 MED ORDER — ROSUVASTATIN CALCIUM 10 MG PO TABS
10.0000 mg | ORAL_TABLET | Freq: Every day | ORAL | 3 refills | Status: AC
  Filled 2020-09-15: qty 90, 90d supply, fill #0
  Filled 2020-12-13: qty 90, 90d supply, fill #1
  Filled 2021-02-25: qty 90, 90d supply, fill #2

## 2020-09-22 ENCOUNTER — Other Ambulatory Visit (HOSPITAL_COMMUNITY): Payer: Self-pay

## 2020-09-22 MED ORDER — FARXIGA 10 MG PO TABS
10.0000 mg | ORAL_TABLET | Freq: Every day | ORAL | 0 refills | Status: DC
  Filled 2020-09-22: qty 90, 90d supply, fill #0

## 2020-09-22 MED ORDER — DICLOFENAC SODIUM 3 % EX GEL
CUTANEOUS | 0 refills | Status: DC
  Filled 2020-09-22: qty 100, 30d supply, fill #0

## 2020-09-23 ENCOUNTER — Other Ambulatory Visit (HOSPITAL_COMMUNITY): Payer: Self-pay

## 2020-10-06 ENCOUNTER — Other Ambulatory Visit (HOSPITAL_COMMUNITY): Payer: Self-pay

## 2020-10-06 MED FILL — Isosorbide Dinitrate-Hydralazine HCl Tab 20-37.5 MG: ORAL | 30 days supply | Qty: 90 | Fill #5 | Status: AC

## 2020-10-13 ENCOUNTER — Telehealth (HOSPITAL_COMMUNITY): Payer: Self-pay | Admitting: *Deleted

## 2020-10-13 NOTE — Telephone Encounter (Signed)
Attempted to call patient regarding upcoming cardiac MRI appointment. Left message on voicemail with name and callback number  Jaivon Vanbeek RN Navigator Cardiac Imaging [facility] [phone] Office [phone] Cell  

## 2020-10-14 ENCOUNTER — Ambulatory Visit (HOSPITAL_COMMUNITY)
Admission: RE | Admit: 2020-10-14 | Discharge: 2020-10-14 | Disposition: A | Discharge status: Home / Self Care | Payer: Medicaid Other | Source: Ambulatory Visit | Attending: Internal Medicine | Admitting: Internal Medicine

## 2020-10-14 ENCOUNTER — Other Ambulatory Visit (HOSPITAL_COMMUNITY): Payer: Self-pay | Admitting: Cardiology

## 2020-10-14 ENCOUNTER — Other Ambulatory Visit: Payer: Self-pay

## 2020-10-14 ENCOUNTER — Other Ambulatory Visit (HOSPITAL_COMMUNITY): Payer: Self-pay

## 2020-10-14 DIAGNOSIS — I5022 Chronic systolic (congestive) heart failure: Secondary | ICD-10-CM | POA: Insufficient documentation

## 2020-10-14 IMAGING — MR MR CARD MORPHOLOGY WO/W CM
45 of 48 series · 45 of 48 positions shown · IV contrast (gadavist)
Comparison: none

CLINICAL DATA: Cardiomyopathy:

EXAM:
CARDIAC MRI Comparison study [DATE]
TECHNIQUE: The patient was scanned on a 1.5 Tesla Siemens magnet. A dedicated
cardiac coil was used. Functional imaging was done using Fiesta
sequences. [DATE], and 4 chamber views were done to assess for RWMA's.
Modified SEKSE rule using a short axis stack was used to
calculate an ejection fraction on a dedicated work station using
Circle software. The patient received 8 cc of Gadavist . After 10
minutes inversion recovery sequences were used to assess for
infiltration and scar tissue.
CONTRAST:  Gadavist

[Series 4: [id number] · axial · 8.0mm · 1.41mm/px · 1 of 18 slices shown]
[im 1/18]
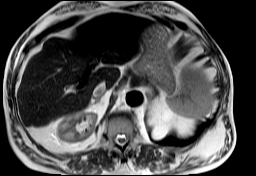

[Series 8: bSSFP · oblique · 8.0mm · 1.61mm/px · 1 of 25 slices shown (1 of 18)]
[im 1/25]
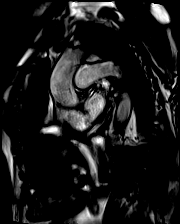

[Series 9: bSSFP · oblique · 8.0mm · 1.61mm/px · 1 of 25 slices shown (2 of 18)]
[im 1/25]
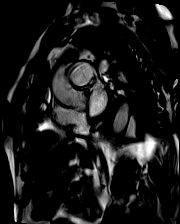

[Series 10: bSSFP · oblique · 8.0mm · 1.61mm/px · 1 of 25 slices shown (3 of 18)]
[im 1/25]
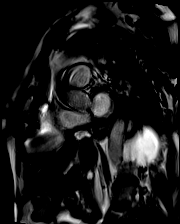

[Series 11: bSSFP · oblique · 8.0mm · 1.61mm/px · 1 of 25 slices shown (4 of 18)]
[im 1/25]
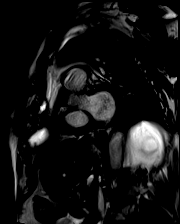

[Series 12: bSSFP · oblique · 8.0mm · 1.61mm/px · 1 of 25 slices shown (5 of 18)]
[im 1/25]
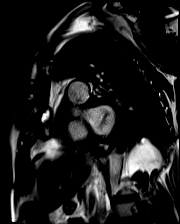

[Series 13: bSSFP · oblique · 8.0mm · 1.61mm/px · 1 of 25 slices shown (6 of 18)]
[im 1/25]
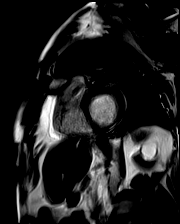

[Series 14: bSSFP · oblique · 8.0mm · 1.61mm/px · 1 of 25 slices shown (7 of 18)]
[im 1/25]
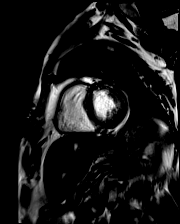

[Series 15: bSSFP · oblique · 8.0mm · 1.61mm/px · 1 of 25 slices shown (8 of 18)]
[im 1/25]
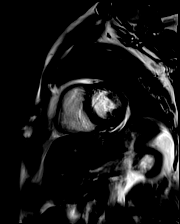

[Series 16: bSSFP · oblique · 8.0mm · 1.61mm/px · 1 of 25 slices shown (9 of 18)]
[im 1/25]
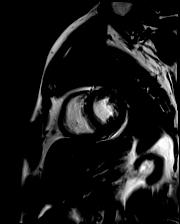

[Series 17: bSSFP · oblique · 8.0mm · 1.61mm/px · 1 of 25 slices shown (10 of 18)]
[im 1/25]
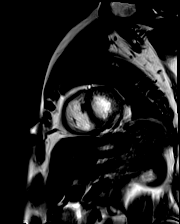

[Series 18: bSSFP · oblique · 8.0mm · 1.61mm/px · 1 of 25 slices shown (11 of 18)]
[im 1/25]
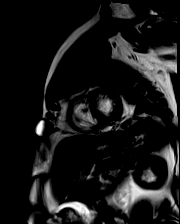

[Series 19: bSSFP · oblique · 8.0mm · 1.61mm/px · 1 of 25 slices shown (12 of 18)]
[im 1/25]
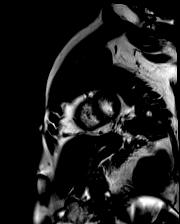

[Series 20: bSSFP · oblique · 8.0mm · 1.61mm/px · 1 of 25 slices shown (13 of 18)]
[im 1/25]
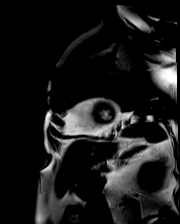

[Series 21: bSSFP · oblique · 8.0mm · 1.61mm/px · 1 of 25 slices shown (14 of 18)]
[im 1/25]
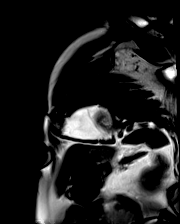

[Series 22: bSSFP · oblique · 8.0mm · 1.61mm/px · 1 of 25 slices shown (15 of 18)]
[im 1/25]
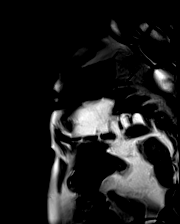

[Series 23: bSSFP · axial · 6.0mm · 1.41mm/px · 1 of 25 slices shown (16 of 18)]
[im 1/25]
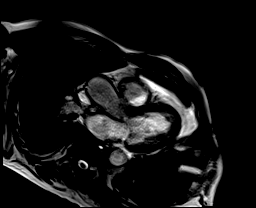

[Series 24: bSSFP · oblique · 6.0mm · 1.41mm/px · 1 of 25 slices shown (17 of 18)]
[im 1/25]
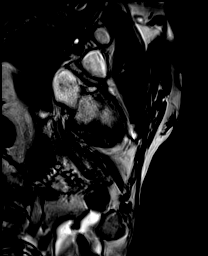

[Series 25: bSSFP · axial · 6.0mm · 1.41mm/px · 1 of 25 slices shown (18 of 18)]
[im 1/25]
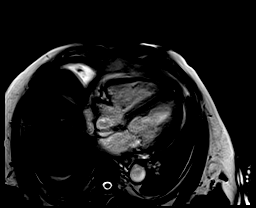

[Series 26: cine_trufi_cs_rt_short axis · oblique · 8.0mm · 1.73mm/px · 1 of 49 slices shown (1 of 25)]
[im 1/49]
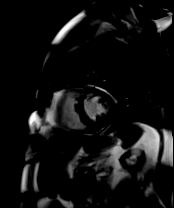

[Series 26: cine_trufi_cs_rt_short axis · oblique · 8.0mm · 1.73mm/px · 1 of 49 slices shown (2 of 25)]
[im 1/49]
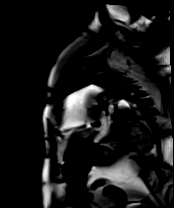

[Series 26: cine_trufi_cs_rt_short axis · oblique · 8.0mm · 1.73mm/px · 1 of 49 slices shown (3 of 25)]
[im 1/49]
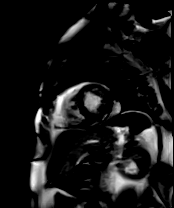

[Series 26: cine_trufi_cs_rt_short axis · oblique · 8.0mm · 1.73mm/px · 1 of 49 slices shown (4 of 25)]
[im 1/49]
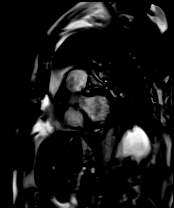

[Series 26: cine_trufi_cs_rt_short axis · oblique · 8.0mm · 1.73mm/px · 1 of 49 slices shown (5 of 25)]
[im 1/49]
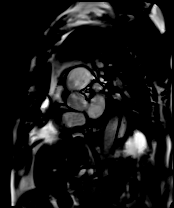

[Series 26: cine_trufi_cs_rt_short axis · oblique · 8.0mm · 1.73mm/px · 1 of 49 slices shown (6 of 25)]
[im 1/49]
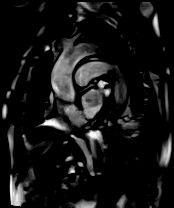

[Series 26: cine_trufi_cs_rt_short axis · oblique · 8.0mm · 1.73mm/px · 1 of 49 slices shown (7 of 25)]
[im 1/49]
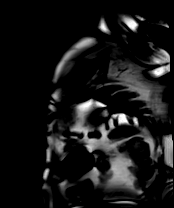

[Series 26: cine_trufi_cs_rt_short axis · oblique · 8.0mm · 1.73mm/px · 1 of 49 slices shown (8 of 25)]
[im 1/49]
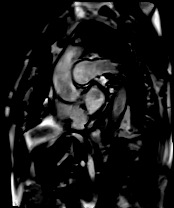

[Series 26: cine_trufi_cs_rt_short axis · oblique · 8.0mm · 1.73mm/px · 1 of 49 slices shown (9 of 25)]
[im 1/49]
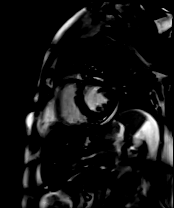

[Series 26: cine_trufi_cs_rt_short axis · oblique · 8.0mm · 1.73mm/px · 1 of 49 slices shown (10 of 25)]
[im 1/49]
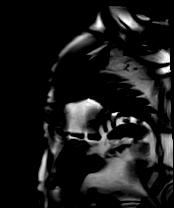

[Series 26: cine_trufi_cs_rt_short axis · oblique · 8.0mm · 1.73mm/px · 1 of 49 slices shown (11 of 25)]
[im 1/49]
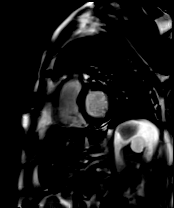

[Series 26: cine_trufi_cs_rt_short axis · oblique · 8.0mm · 1.73mm/px · 1 of 49 slices shown (12 of 25)]
[im 1/49]
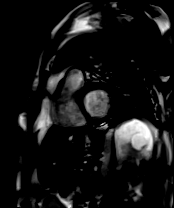

[Series 26: cine_trufi_cs_rt_short axis · oblique · 8.0mm · 1.73mm/px · 1 of 49 slices shown (13 of 25)]
[im 1/49]
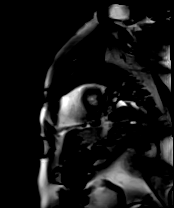

[Series 26: cine_trufi_cs_rt_short axis · oblique · 8.0mm · 1.73mm/px · 1 of 49 slices shown (14 of 25)]
[im 1/49]
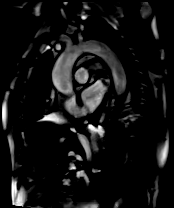

[Series 26: cine_trufi_cs_rt_short axis · oblique · 8.0mm · 1.73mm/px · 1 of 49 slices shown (15 of 25)]
[im 1/49]
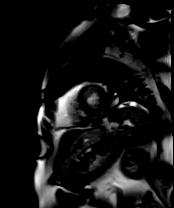

[Series 26: cine_trufi_cs_rt_short axis · oblique · 8.0mm · 1.73mm/px · 1 of 49 slices shown (16 of 25)]
[im 1/49]
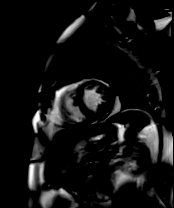

[Series 26: cine_trufi_cs_rt_short axis · oblique · 8.0mm · 1.73mm/px · 1 of 49 slices shown (17 of 25)]
[im 1/49]
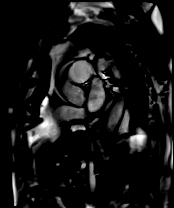

[Series 26: cine_trufi_cs_rt_short axis · oblique · 8.0mm · 1.73mm/px · 1 of 49 slices shown (18 of 25)]
[im 1/49]
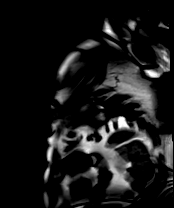

[Series 27: 3ch · axial · 6.0mm · 1.73mm/px · 1 of 14 slices shown]
[im 1/14]
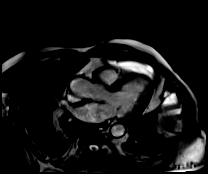

[Series 28: cine_trufi_cs_rt_short axis · oblique · 8.0mm · 1.73mm/px · 1 of 49 slices shown (19 of 25)]
[im 1/49]
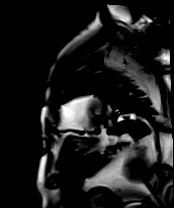

[Series 28: cine_trufi_cs_rt_short axis · oblique · 8.0mm · 1.73mm/px · 1 of 49 slices shown (20 of 25)]
[im 1/49]
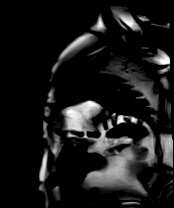

[Series 28: cine_trufi_cs_rt_short axis · oblique · 8.0mm · 1.73mm/px · 1 of 49 slices shown (21 of 25)]
[im 1/49]
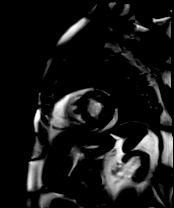

[Series 28: cine_trufi_cs_rt_short axis · oblique · 8.0mm · 1.73mm/px · 1 of 49 slices shown (22 of 25)]
[im 1/49]
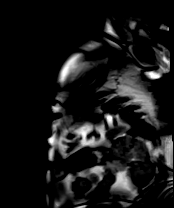

[Series 28: cine_trufi_cs_rt_short axis · oblique · 8.0mm · 1.73mm/px · 1 of 49 slices shown (23 of 25)]
[im 1/49]
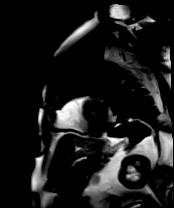

[Series 28: cine_trufi_cs_rt_short axis · oblique · 8.0mm · 1.73mm/px · 1 of 49 slices shown (24 of 25)]
[im 1/49]
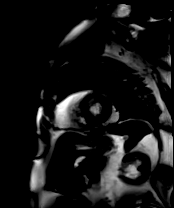

[Series 28: cine_trufi_cs_rt_short axis · oblique · 8.0mm · 1.73mm/px · 1 of 49 slices shown (25 of 25)]
[im 1/49]
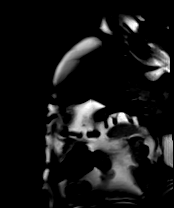

[45 of 48 positions shown; findings below may reference images not displayed]

FINDINGS: Mild LAE. Normal RA/RV. No ASD/PFO. Small lateral pericardial
effusion. Mild aortic root dilatation 3.8 cm. Normal arch vessels
and descending thoracic aorta

There is mild LVE. Diffuse hypokinesis. Quantitative EF is 44% (EDV
118 cc ESV 66 cc SV 52 cc) AV is tri leaflet with no stenosis / AR.
Trivial appearing MR Mld appearing TR Delayed enhancement images
show mid myocardial uptak tin the antero lateral wall and inferior
lateral wall as well as some mottling in the septum

Compared to prior MRI the gadolinium uptake is more impressive and
extends more into the inferolateral segments Native T1 is [F6] with
ECV 51% T2 values also elevated in areas of gadolinium uptake 51
msec - 61 msec
IMPRESSION: 1. Mild LVE diffuse hypokinesis EF 44% improved from [DATE]
measured at 37% then

2. Persistent mid myocardial gadolinium enhancement more impressive
in antero and inferolateral walls compared to previous study
including some mottled uptake in the septum and apex

3.  Small lateral pericardial effusion

4. Abnormal parametrics including elevated T1 mildly elevated T2 in
regions with gadolinium uptake and elevated ECG Given patients age
and like of coronary artery disease? Sarcoid vs infiltrative
cardiomyopathy. Gadolinium uptake more impressive than MRI done
[DATE] and ECG higher

5.  Normal RV size and function

Have asked Dr SEKSE to review

SEKSE

## 2020-10-14 MED ORDER — SPIRONOLACTONE 25 MG PO TABS
25.0000 mg | ORAL_TABLET | Freq: Every day | ORAL | 3 refills | Status: DC
  Filled 2020-10-14: qty 90, 90d supply, fill #0
  Filled 2021-01-24: qty 90, 90d supply, fill #1

## 2020-10-14 MED ORDER — GADOBUTROL 1 MMOL/ML IV SOLN
8.0000 mL | Freq: Once | INTRAVENOUS | Status: AC | PRN
  Administered 2020-10-14: 8 mL via INTRAVENOUS

## 2020-10-14 MED ORDER — ISOSORB DINITRATE-HYDRALAZINE 20-37.5 MG PO TABS
1.0000 | ORAL_TABLET | Freq: Three times a day (TID) | ORAL | 11 refills | Status: DC
  Filled 2020-10-14 – 2020-11-03 (×2): qty 90, 30d supply, fill #0
  Filled 2020-11-30: qty 90, 30d supply, fill #1

## 2020-10-14 MED ORDER — ENTRESTO 97-103 MG PO TABS
1.0000 | ORAL_TABLET | Freq: Two times a day (BID) | ORAL | 11 refills | Status: DC
  Filled 2020-10-14: qty 60, 30d supply, fill #0
  Filled 2020-10-23 – 2020-12-06 (×10): qty 60, 30d supply, fill #1

## 2020-10-24 ENCOUNTER — Other Ambulatory Visit (HOSPITAL_COMMUNITY): Payer: Self-pay

## 2020-10-24 MED FILL — Levothyroxine Sodium Tab 50 MCG: ORAL | 30 days supply | Qty: 30 | Fill #0 | Status: AC

## 2020-11-04 ENCOUNTER — Other Ambulatory Visit (HOSPITAL_COMMUNITY): Payer: Self-pay

## 2020-11-07 ENCOUNTER — Other Ambulatory Visit: Payer: Self-pay

## 2020-11-07 ENCOUNTER — Ambulatory Visit: Payer: Medicaid Other | Attending: Orthopedic Surgery

## 2020-11-07 DIAGNOSIS — M6281 Muscle weakness (generalized): Secondary | ICD-10-CM | POA: Diagnosis present

## 2020-11-07 DIAGNOSIS — G8929 Other chronic pain: Secondary | ICD-10-CM | POA: Insufficient documentation

## 2020-11-07 DIAGNOSIS — M25561 Pain in right knee: Secondary | ICD-10-CM | POA: Diagnosis present

## 2020-11-07 DIAGNOSIS — M79605 Pain in left leg: Secondary | ICD-10-CM | POA: Diagnosis present

## 2020-11-07 DIAGNOSIS — M79604 Pain in right leg: Secondary | ICD-10-CM | POA: Diagnosis present

## 2020-11-07 DIAGNOSIS — M5442 Lumbago with sciatica, left side: Secondary | ICD-10-CM | POA: Insufficient documentation

## 2020-11-07 DIAGNOSIS — R2681 Unsteadiness on feet: Secondary | ICD-10-CM | POA: Insufficient documentation

## 2020-11-07 DIAGNOSIS — M5441 Lumbago with sciatica, right side: Secondary | ICD-10-CM | POA: Insufficient documentation

## 2020-11-07 NOTE — Patient Instructions (Signed)
  [id number] 

## 2020-11-07 NOTE — Therapy (Signed)
[facility] [address] [place], [place], [address] Phone: [phone]   Fax:  [phone]  Physical Therapy Evaluation  Patient Details  Name: Steven Frazier MRN: [id number] Date of Birth: 02-Feb-1955 Referring Provider (PT): Sheral Apley, MD   Encounter Date: 11/07/2020   PT End of Session - 11/07/20 1328     Visit Number 1    Number of Visits 9    Date for PT Re-Evaluation 01/02/21    Authorization Type [employer]    Authorization Time Period Pending Authorization    PT Start Time 0915    PT Stop Time 1000    PT Time Calculation (min) 45 min    Activity Tolerance Patient tolerated treatment well    Behavior During Therapy [facility] for tasks assessed/performed             Past Medical History:  Diagnosis Date   CHF (congestive heart failure) (HCC)    Heart failure (HCC)    Hypertension    Hyponatremia    Hypothyroidism    Shoulder pain     Past Surgical History:  Procedure Laterality Date   HERNIA REPAIR     NO PAST SURGERIES     RIGHT/LEFT HEART CATH AND CORONARY ANGIOGRAPHY N/A 11/16/2019   Procedure: RIGHT/LEFT HEART CATH AND CORONARY ANGIOGRAPHY;  Surgeon: Dolores Patty, MD;  Location: [facility];  Service: Cardiovascular;  Laterality: N/A;    There were no vitals filed for this visit.    Subjective Assessment - 11/07/20 0914     Subjective Pt reports primary c/o BIL lumbar pain with radicular sxs into BIL LE to the posterior calves R>L of insidious onset lasting about 10 months. He was previously seen in PT around March/ April with positive results. He was subsequently discharged with a robust HEP. Since then, his pain has returned. He received two lumbar steroid injections about 2 weeks ago for pain, which did not help. His knee pain is always preceded by LBP and is never present independent of LBP. Pt denies any unexplained changes in weight, bowel/ bladder function, nausea/ vomiting,  N/T, or unrelenting night pain. He reports that pain is worst in the morning when getting out of bed. He reports that after doing his HEP in the morning, his pain improves a little bit. He also states that his leg pain is worse than his back pain. Current pain is 7-8/10. Worst is 10/10. Best is 5/10. Pt adds that he has congestive heart failure, which prevents him from doing strenuous exercise.    Pertinent History CHF, HTN    How long can you sit comfortably? 30 minutes, requires cane to stand    How long can you stand comfortably? 30 minutes    How long can you walk comfortably? 30 minutes with cane    Patient Stated Goals Be able to walk, do house chores with less limitation.    Currently in Pain? Yes    Pain Score 8     Pain Location Back    Pain Orientation Right;Left;Lower    Pain Descriptors / Indicators Aching    Pain Type Chronic pain    Pain Onset More than a month ago    Pain Frequency Constant    Aggravating Factors  prolonged laying supine or sitting    Pain Relieving Factors Doing home exercises    Effect of Pain on Daily Activities See pt goals  [facility] PT Assessment - 11/07/20 0001       Assessment   Medical Diagnosis R knee pain    Referring Provider (PT) Sheral Apley, MD    Onset Date/Surgical Date 11/08/18    Hand Dominance Right    Next MD Visit Unknown    Prior Therapy Yes, for same problem earlier this year      Precautions   Precautions None      Restrictions   Weight Bearing Restrictions No      Balance Screen   Has the patient fallen in the past 6 months No    Has the patient had a decrease in activity level because of a fear of falling?  No    Is the patient reluctant to leave their home because of a fear of falling?  No      Prior Function   Level of Independence Independent    Leisure Exercising      Functional Tests   Functional tests Squat;Single leg stance      Squat   Comments 75% with LBP      Single Leg Stance    Comments 7 sec on R, 5 sec on L      AROM   Right Knee Extension 0    Right Knee Flexion 118    Left Knee Extension 0    Left Knee Flexion 122    Lumbar Flexion 80 p!    Lumbar Extension 25 p!!    Lumbar - Right Side Bend 21cm    Lumbar - Left Side Bend 24cm    Lumbar - Right Rotation 80    Lumbar - Left Rotation 60 with L LE p!      Strength   Right Hip Flexion 5/5    Right Hip Extension 4/5    Right Hip ABduction 3+/5    Left Hip Flexion 5/5    Left Hip Extension 4-/5    Left Hip ABduction 3+/5    Right Knee Flexion 5/5    Right Knee Extension 5/5    Left Knee Flexion 5/5    Left Knee Extension 5/5      Flexibility   Hamstrings hamstring 90/90 (+) BIL, 40d shy of full extension BIL      Palpation   Spinal mobility Hypomobile and painful with reproduction of concordant LE pain at L4-L5    Palpation comment TTP to BIL glutes, No TTP to BIL paraspinals/QL      Special Tests    Special Tests Lumbar    Lumbar Tests Slump Test;Straight Leg Raise      Transfers   Five time sit to stand comments  15 seconds                        Objective measurements completed on examination: See above findings.                PT Education - 11/07/20 1328     Education Details Pt educated on probable underlying pathophysiology behind his pain presentation, POC, and proper form with HEP.    Person(s) Educated Patient    Methods Explanation;Demonstration;Handout    Comprehension Verbalized understanding;Returned demonstration              PT Short Term Goals - 11/07/20 1340       PT SHORT TERM GOAL #1   Title Pt will report understanding and adherence to his HEP in order to promote independence in the  management of his primary sxs.    Baseline HEP provided at eval.    Time 4    Period Weeks    Status New    Target Date 12/05/20               PT Long Term Goals - 11/07/20 1340       PT LONG TERM GOAL #1   Title Pt will achieve WNL  lumbar AROM in all planes with 0-4/10 pain.    Baseline 8/10 pain with lumbar flexion, extension, and L rotation.    Time 8    Period Weeks    Status New    Target Date 01/02/21      PT LONG TERM GOAL #2   Title Pt will achieve BIL hip extension and abduction MMT of 4+/5 or greater in order to progress his independent LE strengthening regimen without limitation.    Baseline See flowsheet    Time 8    Period Weeks    Status New    Target Date 01/02/21      PT LONG TERM GOAL #3   Title Pt will achieve SL stance x 15 seconds BIL in order to promote functional community-level balance for ambulation.    Baseline See flowsheet    Time 8    Period Weeks    Status New    Target Date 01/02/21      PT LONG TERM GOAL #4   Title Pt will achieve a full-depth squat with 0-4/10 pain in order to lift groceries from the floor with less limitation.    Baseline >8/10 pain with squatting.    Time 8    Period Weeks    Status New    Target Date 01/02/21      PT LONG TERM GOAL #5   Title Pt will report ability to stand >1 hour in order to perform yard work with less limitation.    Baseline Unable to stand >30 minutes due to pain.    Time 8    Period Weeks    Status New    Target Date 01/02/21                    Plan - 11/07/20 1330     Clinical Impression Statement Pt is a pleasant 65yo M who presents with primary c/o chronic LBP with BIL radicular pain into BIL LE R>L of insidious onset. Upon assessment, his primary impairments include pain with lumbar extension and flexion AROM, pain and limitation in L trunk rotation, pain with squatting, poor single leg balance, weak BIL hip extension and abduction, TTP to BIL glutes, hypomobile and painful lumbar passive accessories, and moderately limited BIL hamstring extensibility. Ruling up lumbar spinal stenosis due to 5/5 findings for spinal stenosis cluster, indicating a very high likelihood of spinal stenosis as a primary contributing  factor for pain. The pt will benefit from skilled PT to address his primary impairments and return to his prior level of function with less limitation due to pain.    Personal Factors and Comorbidities Comorbidity 3+    Comorbidities CHF, HTN    Examination-Activity Limitations Sit;Squat;Stairs;Stand;Locomotion Level;Transfers;Bathing;Bend    Examination-Participation Restrictions Shop;Yard Work;Community Activity    Stability/Clinical Decision Making Evolving/Moderate complexity    Clinical Decision Making Moderate    Rehab Potential Good    PT Frequency 1x / week    PT Duration 8 weeks    PT Treatment/Interventions ADLs/Self Care Home Management;Aquatic Therapy;Moist Heat;Stair training;Gait training;Patient/family  education;Therapeutic activities;Therapeutic exercise;Balance training;Neuromuscular re-education;Manual techniques;Joint Manipulations;Spinal Manipulations;Dry needling;Passive range of motion;Taping    PT Next Visit Plan Progress core strengthening/ lumbar mobility    PT Home Exercise Plan [id number]    Consulted and Agree with Plan of Care Patient             Patient will benefit from skilled therapeutic intervention in order to improve the following deficits and impairments:  Difficulty walking, Pain, Improper body mechanics, Impaired flexibility, Hypomobility, Decreased balance, Decreased mobility, Decreased strength  Visit Diagnosis: Chronic bilateral low back pain with bilateral sciatica  Pain in left leg  Pain in right leg  Muscle weakness (generalized)  Unsteadiness on feet     Problem List Patient Active Problem List   Diagnosis Date Noted   Adrenal insufficiency (HCC) 02/08/2020   Acquired hypothyroidism 02/08/2020   Malnutrition of moderate degree 11/18/2019   Acute on chronic heart failure (HCC) 11/13/2019   Acute systolic CHF (congestive heart failure) (HCC) 09/04/2019   Microcytic anemia 09/04/2019   Anasarca 09/04/2019   Hyponatremia  09/03/2019     [facility] [address] [place], [place], [address] Phone: [phone]   Fax:  [phone]  Name: Steven Frazier MRN: [id number] Date of Birth: 11-12-1955  Check all possible CPT codes: 97110- Therapeutic Exercise, [phone]- Neuro Re-education, [phone] - Gait Training, [phone] - Manual Therapy, [phone] - Therapeutic Activities, and 97535 - Self Care  Carmelina Dane, PT, DPT 11/07/20 1:47 PM

## 2020-11-09 ENCOUNTER — Other Ambulatory Visit (HOSPITAL_COMMUNITY): Payer: Self-pay

## 2020-11-09 MED ORDER — HYDROCORTISONE 10 MG PO TABS
30.0000 mg | ORAL_TABLET | Freq: Every day | ORAL | 0 refills | Status: DC
  Filled 2020-11-09: qty 30, 10d supply, fill #0

## 2020-11-18 ENCOUNTER — Ambulatory Visit: Payer: Medicaid Other | Admitting: Physical Therapy

## 2020-11-18 ENCOUNTER — Encounter: Payer: Self-pay | Admitting: Physical Therapy

## 2020-11-18 ENCOUNTER — Other Ambulatory Visit: Payer: Self-pay

## 2020-11-18 DIAGNOSIS — G8929 Other chronic pain: Secondary | ICD-10-CM

## 2020-11-18 DIAGNOSIS — M5442 Lumbago with sciatica, left side: Secondary | ICD-10-CM | POA: Diagnosis not present

## 2020-11-18 NOTE — Therapy (Signed)
[facility] [address] [place], [place], [address] Phone: [phone]   Fax:  [phone]  Physical Therapy Treatment  Patient Details  Name: Steven Frazier MRN: [id number] Date of Birth: [birth year] Referring Provider (PT): Sheral Apley, MD   Encounter Date: 11/18/2020   PT End of Session - 11/18/20 0904     Visit Number 2    Number of Visits 9    Date for PT Re-Evaluation 01/02/21    Authorization Type [employer]    Authorization Time Period Pending Authorization    PT Start Time [phone]   pt late   PT Stop Time 0933    PT Time Calculation (min) 38 min             Past Medical History:  Diagnosis Date   CHF (congestive heart failure) (HCC)    Heart failure (HCC)    Hypertension    Hyponatremia    Hypothyroidism    Shoulder pain     Past Surgical History:  Procedure Laterality Date   HERNIA REPAIR     NO PAST SURGERIES     RIGHT/LEFT HEART CATH AND CORONARY ANGIOGRAPHY N/A 11/16/2019   Procedure: RIGHT/LEFT HEART CATH AND CORONARY ANGIOGRAPHY;  Surgeon: Dolores Patty, MD;  [facility];  Service: Cardiovascular;  Laterality: N/A;    There were no vitals filed for this visit.   Subjective Assessment - 11/18/20 0858     Subjective Pt reports bialteral knee pain and back pain today rated at 8-9/10.    Currently in Pain? Yes    Pain Score 8     Pain Location Back    Pain Orientation Right;Left;Lower    Pain Descriptors / Indicators Aching    Pain Type Chronic pain    Aggravating Factors  prolonged positions    Pain Relieving Factors doing exercises                    [facility] Adult PT Treatment/Exercise - 11/18/20 0001       Knee/Hip Exercises: Stretches   Other Knee/Hip Stretches double knee to chest.      Knee/Hip Exercises: Aerobic   Nustep L3 UE/LE x 5 min      Knee/Hip Exercises: Seated   Long Arc Quad 20 reps;Right;Left    Sit to [employer] 10 reps       Knee/Hip Exercises: Supine   Quad Sets 10 reps    Straight Leg Raises 10 reps    Other Supine Knee/Hip Exercises isometric hip flexion-per HEP- modified to one leg at a time due to difficulty and poor form      Knee/Hip Exercises: Sidelying   Hip ABduction 10 reps                       PT Short Term Goals - 11/07/20 1340       PT SHORT TERM GOAL #1   Title Pt will report understanding and adherence to his HEP in order to promote independence in the management of his primary sxs.    Baseline HEP provided at eval.    Time 4    Period Weeks    Status New    Target Date 12/05/20               PT Long Term Goals - 11/07/20 1340       PT LONG TERM GOAL #1   Title Pt will achieve WNL lumbar  AROM in all planes with 0-4/10 pain.    Baseline 8/10 pain with lumbar flexion, extension, and L rotation.    Time 8    Period Weeks    Status New    Target Date 01/02/21      PT LONG TERM GOAL #2   Title Pt will achieve BIL hip extension and abduction MMT of 4+/5 or greater in order to progress his independent LE strengthening regimen without limitation.    Baseline See flowsheet    Time 8    Period Weeks    Status New    Target Date 01/02/21      PT LONG TERM GOAL #3   Title Pt will achieve SL stance x 15 seconds BIL in order to promote functional community-level balance for ambulation.    Baseline See flowsheet    Time 8    Period Weeks    Status New    Target Date 01/02/21      PT LONG TERM GOAL #4   Title Pt will achieve a full-depth squat with 0-4/10 pain in order to lift groceries from the floor with less limitation.    Baseline >8/10 pain with squatting.    Time 8    Period Weeks    Status New    Target Date 01/02/21      PT LONG TERM GOAL #5   Title Pt will report ability to stand >1 hour in order to perform yard work with less limitation.    Baseline Unable to stand >30 minutes due to pain.    Time 8    Period Weeks    Status New    Target Date  01/02/21                   Plan - 11/18/20 0904     Clinical Impression Statement Pt arrives for first treatment and reports compliance with HEP 2 x per day and pain at 8/10 in back and knees. Reviewed HEP and he required mod cues for isometric 90/90 holds which were modified to one leg at a time due to difficulty. Also, instructed him to not hold breath, perform slowly for 5 sec. He will need reinforcement on lumbar positioning.    PT Treatment/Interventions ADLs/Self Care Home Management;Aquatic Therapy;Moist Heat;Stair training;Gait training;Patient/family education;Therapeutic activities;Therapeutic exercise;Balance training;Neuromuscular re-education;Manual techniques;Joint Manipulations;Spinal Manipulations;Dry needling;Passive range of motion;Taping    PT Next Visit Plan Progress core strengthening/ lumbar mobility    PT Home Exercise Plan [id number]    Consulted and Agree with Plan of Care Patient             Patient will benefit from skilled therapeutic intervention in order to improve the following deficits and impairments:  Difficulty walking, Pain, Improper body mechanics, Impaired flexibility, Hypomobility, Decreased balance, Decreased mobility, Decreased strength  Visit Diagnosis: Chronic pain of right knee     Problem List Patient Active Problem List   Diagnosis Date Noted   Adrenal insufficiency (HCC) 02/08/2020   Acquired hypothyroidism 02/08/2020   Malnutrition of moderate degree 11/18/2019   Acute on chronic heart failure (HCC) 11/13/2019   Acute systolic CHF (congestive heart failure) (HCC) 09/04/2019   Microcytic anemia 09/04/2019   Anasarca 09/04/2019   Hyponatremia 09/03/2019    Sherrie Mustache, PTA 11/18/2020, 12:09 PM  [facility] [address] [place], [place], [address] Phone: [phone]   Fax:  [phone]  Name: Steven Frazier MRN: [id number] Date of Birth: Dec 30, 1955

## 2020-11-19 MED FILL — Levothyroxine Sodium Tab 50 MCG: ORAL | 30 days supply | Qty: 30 | Fill #1 | Status: CN

## 2020-11-21 ENCOUNTER — Other Ambulatory Visit (HOSPITAL_COMMUNITY): Payer: Self-pay

## 2020-11-21 ENCOUNTER — Other Ambulatory Visit (HOSPITAL_COMMUNITY): Payer: Self-pay | Admitting: Adult Health

## 2020-11-22 ENCOUNTER — Other Ambulatory Visit (HOSPITAL_COMMUNITY): Payer: Self-pay

## 2020-11-23 ENCOUNTER — Other Ambulatory Visit (HOSPITAL_COMMUNITY): Payer: Self-pay

## 2020-11-23 MED ORDER — LEVOTHYROXINE SODIUM 50 MCG PO TABS
50.0000 ug | ORAL_TABLET | Freq: Every day | ORAL | 5 refills | Status: DC
  Filled 2020-11-23: qty 30, 30d supply, fill #0

## 2020-11-25 ENCOUNTER — Other Ambulatory Visit (HOSPITAL_COMMUNITY): Payer: Self-pay

## 2020-11-25 ENCOUNTER — Other Ambulatory Visit: Payer: Self-pay

## 2020-11-25 ENCOUNTER — Ambulatory Visit: Payer: Medicaid Other

## 2020-11-25 DIAGNOSIS — M5442 Lumbago with sciatica, left side: Secondary | ICD-10-CM | POA: Diagnosis not present

## 2020-11-25 DIAGNOSIS — M79604 Pain in right leg: Secondary | ICD-10-CM

## 2020-11-25 DIAGNOSIS — G8929 Other chronic pain: Secondary | ICD-10-CM

## 2020-11-25 DIAGNOSIS — R2681 Unsteadiness on feet: Secondary | ICD-10-CM

## 2020-11-25 DIAGNOSIS — M6281 Muscle weakness (generalized): Secondary | ICD-10-CM

## 2020-11-25 DIAGNOSIS — M79605 Pain in left leg: Secondary | ICD-10-CM

## 2020-11-25 NOTE — Patient Instructions (Signed)
  [id number]

## 2020-11-25 NOTE — Therapy (Addendum)
[facility] [address] [place], [place], [address] Phone: [phone]   Fax:  [phone]  Physical Therapy Treatment  Patient Details  Name: Steven Frazier MRN: [id number] Date of Birth: [birth year] Referring Provider (PT): Sheral Apley, MD   Encounter Date: 11/25/2020   PT End of Session - 11/25/20 0933     Visit Number 3    Number of Visits 9    Date for PT Re-Evaluation 01/02/21    Authorization Type [employer]    Authorization Time Period 11/18/2020-01/06/2021    Authorization - Visit Number 2    Authorization - Number of Visits 8    PT Start Time 0915    PT Stop Time 1000    PT Time Calculation (min) 45 min    Activity Tolerance Patient tolerated treatment well    Behavior During Therapy [facility] for tasks assessed/performed             Past Medical History:  Diagnosis Date   CHF (congestive heart failure) (HCC)    Heart failure (HCC)    Hypertension    Hyponatremia    Hypothyroidism    Shoulder pain     Past Surgical History:  Procedure Laterality Date   HERNIA REPAIR     NO PAST SURGERIES     RIGHT/LEFT HEART CATH AND CORONARY ANGIOGRAPHY N/A 11/16/2019   Procedure: RIGHT/LEFT HEART CATH AND CORONARY ANGIOGRAPHY;  Surgeon: Dolores Patty, MD;  [facility];  Service: Cardiovascular;  Laterality: N/A;    There were no vitals filed for this visit.   Subjective Assessment - 11/25/20 0912     Subjective Pt reports continued BIL posterior thigh pain radiating to behind his knees. He reports doing his HEP twice per day; he adds that he has no pain with the selected exercises.    Pertinent History CHF, HTN    Currently in Pain? Yes    Pain Score 8     Pain Location Leg    Pain Orientation Left;Right;Posterior;Upper    Pain Descriptors / Indicators Aching;Burning    Pain Type Chronic pain    Pain Onset More than a month ago    Pain Frequency Constant                 [facility] Adult PT Treatment/Exercise:  Therapeutic Exercise: - Supine 90/90 physioball abdominal isometric with red physioball 3x30sec -Lateral diagonal abdominal pressodwn with 13# cable 3x10 BIL -Repeated trunk flexion from 20degrees of flexion to 60 degrees of flexion 3x10 in-between abdominal pressdown sets -Alternating trunk side bend with 10# KB's in a slightly flexed trunk position in standing 2x10 BIL  -Cybex hip flexion with slow lowering 2x10 BIL with 12.5# -Supine modified Thomas hip flexor stretch 2x1 minute BIL -Standing Pallof press with RTB 2x8 BIL with 5-sec hold  Manual Therapy: - Not performed today  Neuromuscular re-ed: - Not performed today  Therapeutic Activity: - Not performed today  Self-care/Home Management: - See pt instructions/ education                         PT Education - 11/25/20 0949     Education Details Updated HEP    Person(s) Educated Patient    Methods Explanation;Demonstration;Handout    Comprehension Verbalized understanding;Returned demonstration              PT Short Term Goals - 11/07/20 1340       PT SHORT TERM  GOAL #1   Title Pt will report understanding and adherence to his HEP in order to promote independence in the management of his primary sxs.    Baseline HEP provided at eval.    Time 4    Period Weeks    Status New    Target Date 12/05/20               PT Long Term Goals - 11/07/20 1340       PT LONG TERM GOAL #1   Title Pt will achieve WNL lumbar AROM in all planes with 0-4/10 pain.    Baseline 8/10 pain with lumbar flexion, extension, and L rotation.    Time 8    Period Weeks    Status New    Target Date 01/02/21      PT LONG TERM GOAL #2   Title Pt will achieve BIL hip extension and abduction MMT of 4+/5 or greater in order to progress his independent LE strengthening regimen without limitation.    Baseline See flowsheet    Time 8    Period Weeks    Status New     Target Date 01/02/21      PT LONG TERM GOAL #3   Title Pt will achieve SL stance x 15 seconds BIL in order to promote functional community-level balance for ambulation.    Baseline See flowsheet    Time 8    Period Weeks    Status New    Target Date 01/02/21      PT LONG TERM GOAL #4   Title Pt will achieve a full-depth squat with 0-4/10 pain in order to lift groceries from the floor with less limitation.    Baseline >8/10 pain with squatting.    Time 8    Period Weeks    Status New    Target Date 01/02/21      PT LONG TERM GOAL #5   Title Pt will report ability to stand >1 hour in order to perform yard work with less limitation.    Baseline Unable to stand >30 minutes due to pain.    Time 8    Period Weeks    Status New    Target Date 01/02/21                   Plan - 11/25/20 1025     Clinical Impression Statement Pt responded well to all interventions today, demonstrating proper form and minimal increase in pain with some exercises. Of note, his pain was centralized following core isometrics, abdominal pressdowns, and repeated trunk flexion. Flexion based core strengthening will be a focus of treatment moving forward. The pt will benefit from continued skilled PT to address his primary impairments and return to his functional baseline with less limitation.    Personal Factors and Comorbidities Comorbidity 3+    Comorbidities CHF, HTN    Examination-Activity Limitations Sit;Squat;Stairs;Stand;Locomotion Level;Transfers;Bathing;Bend    Examination-Participation Restrictions Shop;Yard Work;Community Activity    Stability/Clinical Decision Making Evolving/Moderate complexity    Clinical Decision Making Moderate    Rehab Potential Good    PT Frequency 1x / week    PT Duration 8 weeks    PT Treatment/Interventions ADLs/Self Care Home Management;Aquatic Therapy;Moist Heat;Stair training;Gait training;Patient/family education;Therapeutic activities;Therapeutic  exercise;Balance training;Neuromuscular re-education;Manual techniques;Joint Manipulations;Spinal Manipulations;Dry needling;Passive range of motion;Taping    PT Next Visit Plan Progress flexion-based core strengthening/ lumbar mobility    PT Home Exercise Plan [id number]    Consulted and Agree with  Plan of Care Patient             Patient will benefit from skilled therapeutic intervention in order to improve the following deficits and impairments:  Difficulty walking, Pain, Improper body mechanics, Impaired flexibility, Hypomobility, Decreased balance, Decreased mobility, Decreased strength  Visit Diagnosis: Chronic bilateral low back pain with bilateral sciatica  Pain in right leg  Pain in left leg  Muscle weakness (generalized)  Unsteadiness on feet     Problem List Patient Active Problem List   Diagnosis Date Noted   Adrenal insufficiency (HCC) 02/08/2020   Acquired hypothyroidism 02/08/2020   Malnutrition of moderate degree 11/18/2019   Acute on chronic heart failure (HCC) 11/13/2019   Acute systolic CHF (congestive heart failure) (HCC) 09/04/2019   Microcytic anemia 09/04/2019   Anasarca 09/04/2019   Hyponatremia 09/03/2019    Carmelina Dane, PT, DPT 11/25/20 9:59 AM   [facility] [address] [place], [place], [address] Phone: [phone]   Fax:  [phone]  Name: Steven Frazier MRN: [id number] Date of Birth: 1955/04/07

## 2020-11-30 ENCOUNTER — Other Ambulatory Visit (HOSPITAL_COMMUNITY): Payer: Self-pay

## 2020-12-01 ENCOUNTER — Other Ambulatory Visit (HOSPITAL_COMMUNITY): Payer: Self-pay

## 2020-12-02 ENCOUNTER — Other Ambulatory Visit (HOSPITAL_COMMUNITY): Payer: Self-pay

## 2020-12-02 ENCOUNTER — Encounter: Payer: Self-pay | Admitting: Physical Therapy

## 2020-12-02 ENCOUNTER — Telehealth: Payer: Self-pay | Admitting: Internal Medicine

## 2020-12-02 ENCOUNTER — Other Ambulatory Visit: Payer: Self-pay

## 2020-12-02 ENCOUNTER — Ambulatory Visit: Payer: Medicaid Other | Attending: Orthopedic Surgery | Admitting: Physical Therapy

## 2020-12-02 DIAGNOSIS — M5441 Lumbago with sciatica, right side: Secondary | ICD-10-CM | POA: Diagnosis present

## 2020-12-02 DIAGNOSIS — M79604 Pain in right leg: Secondary | ICD-10-CM | POA: Insufficient documentation

## 2020-12-02 DIAGNOSIS — M79605 Pain in left leg: Secondary | ICD-10-CM | POA: Diagnosis present

## 2020-12-02 DIAGNOSIS — R2681 Unsteadiness on feet: Secondary | ICD-10-CM | POA: Insufficient documentation

## 2020-12-02 DIAGNOSIS — M5442 Lumbago with sciatica, left side: Secondary | ICD-10-CM | POA: Insufficient documentation

## 2020-12-02 DIAGNOSIS — G8929 Other chronic pain: Secondary | ICD-10-CM | POA: Insufficient documentation

## 2020-12-02 DIAGNOSIS — M25561 Pain in right knee: Secondary | ICD-10-CM | POA: Diagnosis present

## 2020-12-02 DIAGNOSIS — M6281 Muscle weakness (generalized): Secondary | ICD-10-CM | POA: Diagnosis present

## 2020-12-02 MED ORDER — HYDROCORTISONE 5 MG PO TABS
5.0000 mg | ORAL_TABLET | Freq: Every evening | ORAL | 0 refills | Status: DC
  Filled 2020-12-02: qty 30, 30d supply, fill #0

## 2020-12-02 MED ORDER — HYDROCORTISONE 10 MG PO TABS
10.0000 mg | ORAL_TABLET | Freq: Every day | ORAL | 0 refills | Status: DC
  Filled 2020-12-02: qty 30, 30d supply, fill #0

## 2020-12-02 NOTE — Therapy (Signed)
[facility] [address] [place], [place], [address] Phone: [phone]   Fax:  [phone]  Physical Therapy Treatment  Patient Details  Name: Steven Frazier MRN: [id number] Date of Birth: 05/16/1955 Referring Provider (PT): Sheral Apley, MD   Encounter Date: 12/02/2020   PT End of Session - 12/02/20 0919     Visit Number 4    Number of Visits 9    Date for PT Re-Evaluation 01/02/21    Authorization Type [employer]    Authorization Time Period 11/18/2020-01/06/2021    Authorization - Visit Number 3    Authorization - Number of Visits 8    PT Start Time 0912    PT Stop Time 0955    PT Time Calculation (min) 43 min    Activity Tolerance Patient tolerated treatment well    Behavior During Therapy [facility] for tasks assessed/performed             Past Medical History:  Diagnosis Date   CHF (congestive heart failure) (HCC)    Heart failure (HCC)    Hypertension    Hyponatremia    Hypothyroidism    Shoulder pain     Past Surgical History:  Procedure Laterality Date   HERNIA REPAIR     NO PAST SURGERIES     RIGHT/LEFT HEART CATH AND CORONARY ANGIOGRAPHY N/A 11/16/2019   Procedure: RIGHT/LEFT HEART CATH AND CORONARY ANGIOGRAPHY;  Surgeon: Dolores Patty, MD;  [facility];  Service: Cardiovascular;  Laterality: N/A;    There were no vitals filed for this visit.   Subjective Assessment - 12/02/20 0917     Subjective Patient reports back of both legs continue to hurt. Patient continues to reprot consistency with exercises 2x/day, states he guesses they are helping.    Patient Stated Goals Be able to walk, do house chores with less limitation.    Currently in Pain? Yes    Pain Score 6     Pain Location Leg    Pain Orientation Right;Left;Posterior    Pain Descriptors / Indicators Aching;Tightness    Pain Type Chronic pain    Pain Onset More than a month ago    Pain Frequency Intermittent     Aggravating Factors  Walking                [facility] PT Assessment - 12/02/20 0001       Strength   Right Hip ABduction 4-/5    Left Hip ABduction 4-/5      Transfers   Five time sit to stand comments  13 seconds                    [facility] Adult PT Treatment/Exercise:  Therapeutic Exercise: NuStep L5 x 5 min with UE/LE while taking subjective LTR 5 x 10 sec SKTC stretch 2 x 30 sec Piriformis stretch 2 x 30 sec each Seated hamstring stretch 2 x 30 sec each Seated lumbar flexion stretch with physioball 5 x 10 sec Modified thomas stretch PROM 2 x 30 sec each Bridge 2 x 10 with 5 sec hold - focus on core activation, avoid lumbar extension Sidelying hip abduction 2 x 10 each SLR 2 x 10 each - focus on core/quad activation throughout Hooklying clamshell with blue 2 x 10  Manual Therapy: N/A  Neuromuscular re-ed: N/A  Therapeutic Activity: N/A  Modalities: N/A  Self Care: N/A  PT Education - 12/02/20 0919     Education Details HEP    Person(s) Educated Patient    Methods Explanation;Demonstration;Verbal cues    Comprehension Verbalized understanding;Returned demonstration;Verbal cues required;Need further instruction              PT Short Term Goals - 12/02/20 0923       PT SHORT TERM GOAL #1   Title Pt will report understanding and adherence to his HEP in order to promote independence in the management of his primary sxs.    Baseline patient reports consistency and independence with current HEP    Time 4    Period Weeks    Status Achieved    Target Date 12/05/20               PT Long Term Goals - 11/07/20 1340       PT LONG TERM GOAL #1   Title Pt will achieve WNL lumbar AROM in all planes with 0-4/10 pain.    Baseline 8/10 pain with lumbar flexion, extension, and L rotation.    Time 8    Period Weeks    Status New    Target Date 01/02/21      PT LONG TERM GOAL #2   Title Pt will achieve BIL  hip extension and abduction MMT of 4+/5 or greater in order to progress his independent LE strengthening regimen without limitation.    Baseline See flowsheet    Time 8    Period Weeks    Status New    Target Date 01/02/21      PT LONG TERM GOAL #3   Title Pt will achieve SL stance x 15 seconds BIL in order to promote functional community-level balance for ambulation.    Baseline See flowsheet    Time 8    Period Weeks    Status New    Target Date 01/02/21      PT LONG TERM GOAL #4   Title Pt will achieve a full-depth squat with 0-4/10 pain in order to lift groceries from the floor with less limitation.    Baseline >8/10 pain with squatting.    Time 8    Period Weeks    Status New    Target Date 01/02/21      PT LONG TERM GOAL #5   Title Pt will report ability to stand >1 hour in order to perform yard work with less limitation.    Baseline Unable to stand >30 minutes due to pain.    Time 8    Period Weeks    Status New    Target Date 01/02/21                   Plan - 12/02/20 2025     Clinical Impression Statement Patient tolerated therapy well with no adverse effects. Therapy focused on progressing mobility and strength with good tolerance. He does demonstrate improved hip strength this visit and improvement in his 5xSTS. His overall mobility seems to be improving and he reports pain lessened at end of therapy. No changes made to HEP this visit. Patient would benefit from continued skilled PT to progress his mobility and strength in order to reduce pain and maximize functional ability.    PT Treatment/Interventions ADLs/Self Care Home Management;Aquatic Therapy;Moist Heat;Stair training;Gait training;Patient/family education;Therapeutic activities;Therapeutic exercise;Balance training;Neuromuscular re-education;Manual techniques;Joint Manipulations;Spinal Manipulations;Dry needling;Passive range of motion;Taping    PT Next Visit Plan Progress core strengthening / lumbar  mobility, review HEP and progress  PRN    PT Home Exercise Plan [email or web address]    Consulted and Agree with Plan of Care Patient             Patient will benefit from skilled therapeutic intervention in order to improve the following deficits and impairments:  Difficulty walking, Pain, Improper body mechanics, Impaired flexibility, Hypomobility, Decreased balance, Decreased mobility, Decreased strength  Visit Diagnosis: Chronic bilateral low back pain with bilateral sciatica  Pain in right leg  Pain in left leg  Muscle weakness (generalized)  Unsteadiness on feet  Chronic pain of right knee     Problem List Patient Active Problem List   Diagnosis Date Noted   Adrenal insufficiency (HCC) 02/08/2020   Acquired hypothyroidism 02/08/2020   Malnutrition of moderate degree 11/18/2019   Acute on chronic heart failure (HCC) 11/13/2019   Acute systolic CHF (congestive heart failure) (HCC) 09/04/2019   Microcytic anemia 09/04/2019   Anasarca 09/04/2019   Hyponatremia 09/03/2019    Rosana Hoes, PT, DPT, LAT, ATC 12/02/20  9:57 AM Phone: [phone] Fax: [phone]   [facility] Outpatient Rehabilitation [facility] [address] [place], [place], [address] Phone: [phone]   Fax:  [phone]  Name: Steven Frazier MRN: [id number] Date of Birth: May 29, 1955

## 2020-12-02 NOTE — Telephone Encounter (Signed)
Pts daughter called in for refill of  hydrocortisone (CORTEF) 10 MG tablet  hydrocortisone (CORTEF) 5 MG tablet   [facility]  Pt contact [phone]

## 2020-12-02 NOTE — Telephone Encounter (Signed)
Script sent  

## 2020-12-03 ENCOUNTER — Other Ambulatory Visit: Payer: Self-pay

## 2020-12-03 DIAGNOSIS — M7989 Other specified soft tissue disorders: Secondary | ICD-10-CM

## 2020-12-05 ENCOUNTER — Telehealth (HOSPITAL_COMMUNITY): Payer: Self-pay | Admitting: Pharmacy Technician

## 2020-12-05 ENCOUNTER — Other Ambulatory Visit (HOSPITAL_COMMUNITY): Payer: Self-pay

## 2020-12-05 NOTE — Telephone Encounter (Signed)
Patient Advocate Encounter   Received notification from [facility] that prior authorization for Sherryll Burger is required.   PA submitted on CoverMyMeds Key [id number] Status is pending   Will continue to follow.

## 2020-12-05 NOTE — Telephone Encounter (Signed)
Advanced Heart Failure Patient Advocate Encounter  Prior Authorization for Sherryll Burger has been approved.    PA# [id number] Effective dates: 11/21/20 through 12/05/21  Called and spoke with the patient's daughter.   Archer Asa, CPhT

## 2020-12-06 ENCOUNTER — Other Ambulatory Visit (HOSPITAL_COMMUNITY): Payer: Self-pay

## 2020-12-07 ENCOUNTER — Ambulatory Visit: Payer: Medicaid Other | Admitting: Physical Therapy

## 2020-12-07 ENCOUNTER — Encounter: Payer: Self-pay | Admitting: Physical Therapy

## 2020-12-07 ENCOUNTER — Other Ambulatory Visit: Payer: Self-pay

## 2020-12-07 DIAGNOSIS — M5442 Lumbago with sciatica, left side: Secondary | ICD-10-CM

## 2020-12-07 DIAGNOSIS — R2681 Unsteadiness on feet: Secondary | ICD-10-CM

## 2020-12-07 DIAGNOSIS — G8929 Other chronic pain: Secondary | ICD-10-CM

## 2020-12-07 DIAGNOSIS — M6281 Muscle weakness (generalized): Secondary | ICD-10-CM

## 2020-12-07 DIAGNOSIS — M79604 Pain in right leg: Secondary | ICD-10-CM

## 2020-12-07 DIAGNOSIS — M79605 Pain in left leg: Secondary | ICD-10-CM

## 2020-12-07 NOTE — Therapy (Signed)
[place], [place], [address] Phone: [phone]   Fax:  [phone]  Physical Therapy Treatment / Discharge  Patient Details  Name: Steven Frazier MRN: [id number] Date of Birth: 1955-05-24 Referring Provider (PT): Renette Butters, MD   Encounter Date: 12/07/2020   PT End of Session - 12/07/20 1004     Visit Number 5    Number of Visits 9    Date for PT Re-Evaluation 01/02/21    Authorization Type [employer] M[employer]    Authorization Time Period 11/18/2020-01/06/2021    Authorization - Visit Number 4    Authorization - Number of Visits 8    PT Start Time 1000    PT Stop Time 1324    PT Time Calculation (min) 45 min    Activity Tolerance Patient tolerated treatment well    Behavior During Therapy [facility] for tasks assessed/performed             Past Medical History:  Diagnosis Date   CHF (congestive heart failure) ([place])    Heart failure ([place])    Hypertension    Hyponatremia    Hypothyroidism    Shoulder pain     Past Surgical History:  Procedure Laterality Date   HERNIA REPAIR     NO PAST SURGERIES     RIGHT/LEFT HEART CATH AND CORONARY ANGIOGRAPHY N/A 11/16/2019   Procedure: RIGHT/LEFT HEART CATH AND CORONARY ANGIOGRAPHY;  Surgeon: Jolaine Artist, MD;  Location: [place] CV LAB;  Service: Cardiovascular;  Laterality: N/A;    There were no vitals filed for this visit.   Subjective Assessment - 12/07/20 1003     Subjective Patient reports pain in the back of his knees/legs currently but denies any low back pain. States that he continues to improve and pain is better than before.    Patient Stated Goals Be able to walk, do house chores with less limitation.    Currently in Pain? Yes    Pain Score 5     Pain Location Leg    Pain Orientation Right;Left    Pain Descriptors / Indicators Aching;Tightness    Pain Type Chronic pain    Pain Onset More than a month ago    Pain Frequency  Intermittent    Aggravating Factors  Walking    Pain Relieving Factors Exercise and stretching                [facility] PT Assessment - 12/07/20 0001       Assessment   Medical Diagnosis R knee pain    Referring Provider (PT) Renette Butters, MD    Onset Date/Surgical Date 11/08/18      Precautions   Precautions None      Restrictions   Weight Bearing Restrictions No      Balance Screen   Has the patient fallen in the past 6 months No      Prior Function   Level of Independence Independent      Observation/Other Assessments   Focus on Therapeutic Outcomes (FOTO)  NA - MCD      Functional Tests   Functional tests Squat;Single leg stance      Squat   Comments Patient demonstrates full squat through available depth, no increase in pain reported      Single Leg Stance   Comments > 15 seconds bilaterally      AROM   Overall AROM Comments Patient demonstrates lumbar motion grossly WFL without any increase  in pain      Strength   Right Hip Extension 4/5    Right Hip ABduction 4/5    Left Hip Extension 4/5    Left Hip ABduction 4/5                      Therapeutic Exercise: NuStep L5 x 5 min with UE/LE while taking subjective Seated hamstring stretch 2 x 20 sec each LTR x 10 Hooklying knee to chest stretch 2 x 20 sec Piriformis stretch 2 x 20 sec each Modified thomas stretch PROM 2 x 20 sec each SLR 2 x 10 each Bridge 2 x 10 with 5 sec hold Sidelying hip abduction 2 x 10 each Sit<>stand 2 x 10 SLS 2 x 30 sec each   Manual Therapy: N/A   Neuromuscular re-ed: N/A   Therapeutic Activity: N/A   Modalities: N/A   Self Care: See patient education              PT Education - 12/07/20 1004     Education Details POC discharge to independent HEP, review and update of HEP, progress toward goals, follow-up instructions    Person(s) Educated Patient    Methods Explanation;Demonstration;Handout    Comprehension Verbalized  understanding;Returned demonstration              PT Short Term Goals - 12/02/20 0923       PT SHORT TERM GOAL #1   Title Pt will report understanding and adherence to his HEP in order to promote independence in the management of his primary sxs.    Baseline patient reports consistency and independence with current HEP    Time 4    Period Weeks    Status Achieved    Target Date 12/05/20               PT Long Term Goals - 12/07/20 1023       PT LONG TERM GOAL #1   Title Pt will achieve WNL lumbar AROM in all planes with 0-4/10 pain.    Baseline patient demonstrates lumbar motion grossly WFL without any increase in low back pain    Time 8    Period Weeks    Status Achieved      PT LONG TERM GOAL #2   Title Pt will achieve BIL hip extension and abduction MMT of 4+/5 or greater in order to progress his independent LE strengthening regimen without limitation.    Baseline hip strength grossly 4/5 MMT    Time 8    Period Weeks    Status Partially Met      PT LONG TERM GOAL #3   Title Pt will achieve SL stance x 15 seconds BIL in order to promote functional community-level balance for ambulation.    Baseline patient able to perform SLS > 15 sec bilaterally    Time 8    Period Weeks    Status Achieved      PT LONG TERM GOAL #4   Title Pt will achieve a full-depth squat with 0-4/10 pain in order to lift groceries from the floor with less limitation.    Baseline no increase in pain with performing squat to full available depth    Time 8    Period Weeks    Status Achieved      PT LONG TERM GOAL #5   Title Pt will report ability to stand >1 hour in order to perform yard work with less limitation.  Baseline patient reports still limited with standing tolerance, he has not tried standing extended periods    Time 8    Period Weeks    Status Partially Met                   Plan - 12/07/20 1005     Clinical Impression Statement Patient tolerated therapy  well with no adverse effects. He has made great progress with therapy and reports this visit that he is ready to transition to performing HEP independently for a while. He has made progress with his motion, strength, balance, functional ability, and demonstrates independence and conistency with his exercises. He does continues to report pain in the posterior knee/leg region but denies any lumbar pain this visit, and states his pain has greatly improved with therapy. No increase in pain reported with exercises this visit and was able to tolerate performing HEP well. Patient will be discharged to independent HEP and will obtain new PT referral as needed for further therapy.    PT Treatment/Interventions ADLs/Self Care Home Management;Aquatic Therapy;Moist Heat;Stair training;Gait training;Patient/family education;Therapeutic activities;Therapeutic exercise;Balance training;Neuromuscular re-education;Manual techniques;Joint Manipulations;Spinal Manipulations;Dry needling;Passive range of motion;Taping    PT Next Visit Plan NA - discharge    PT Home Exercise Plan [place] and Agree with Plan of Care Patient             Patient will benefit from skilled therapeutic intervention in order to improve the following deficits and impairments:  Difficulty walking, Pain, Improper body mechanics, Impaired flexibility, Hypomobility, Decreased balance, Decreased mobility, Decreased strength  Visit Diagnosis: Chronic bilateral low back pain with bilateral sciatica  Pain in right leg  Pain in left leg  Muscle weakness (generalized)  Unsteadiness on feet     Problem List Patient Active Problem List   Diagnosis Date Noted   Adrenal insufficiency ([place]) 02/08/2020   Acquired hypothyroidism 02/08/2020   Malnutrition of moderate degree 11/18/2019   Acute on chronic heart failure ([place]) 93/81/8299   Acute systolic CHF (congestive heart failure) ([place]) 09/04/2019   Microcytic anemia 09/04/2019    Anasarca 09/04/2019   Hyponatremia 09/03/2019    Hilda Blades, PT, DPT, LAT, ATC 12/07/20  10:44 AM Phone: [phone] Fax: [facility] [place], [place], [address] Phone: [phone]   Fax:  [phone]  Name: Steven Frazier MRN: [id number] Date of Birth: 1955/03/25   PHYSICAL THERAPY DISCHARGE SUMMARY  Visits from Start of Care: 5  Current functional level related to goals / functional outcomes: See above   Remaining deficits: See above   Education / Equipment: HEP   Patient agrees to discharge. Patient goals were partially met. Patient is being discharged due to being pleased with the current functional level.

## 2020-12-07 NOTE — Patient Instructions (Signed)
Access Code: [facility] URL: [email or web address] Date: 12/07/2020 Prepared by: Rosana Hoes  Exercises Seated Hamstring Stretch - 1 x daily - 7 x weekly - 3 reps - 20 seconds hold Supine Lower Trunk Rotation - 1 x daily - 7 x weekly - 10 reps Hooklying Single Knee to Chest Stretch - 1 x daily - 7 x weekly - 3 reps - 20 seconds hold Supine Piriformis Stretch with Foot on Ground - 1 x daily - 7 x weekly - 3 reps - 20 seconds hold Modified Thomas Stretch - 1 x daily - 7 x weekly - 3 reps - 20 seconds hold Active Straight Leg Raise with Quad Set - 1 x daily - 7 x weekly - 2 sets - 10 reps Supine Bridge - 1 x daily - 7 x weekly - 2 sets - 10 reps Sidelying Hip Abduction - 1 x daily - 7 x weekly - 3 sets - 10 reps Sit to Stand Without Arm Support - 1 x daily - 7 x weekly - 3 sets - 10 reps Standing Single Leg Stance with Counter Support - 1 x daily - 7 x weekly - 3 sets - 30 seconds hold

## 2020-12-13 NOTE — Progress Notes (Signed)
Office Note     CC:  Superficial veins with left leg swelling Requesting Provider:  Norm Salt, PA  HPI: Steven Frazier is a 65 y.o. (May 01, 1955) male who presents at the request of Norm Salt, [place] for evaluation of extensive superficial veins.  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  He is accompanied by his son today. Juddson was concerned of a possible condition as his superficial veins are readily apparent under the skin.  Has had no bleeding issues, no varicosities, no heaviness, tired feeling in his lower extremities. No history of venous insufficiency or DVT.   The pt  on a statin for cholesterol management.  The pt  on a daily aspirin.   Other AC:  - The pt is on  for hypertension.   The pt is not diabetic.   Tobacco hx:  none  Past Medical History:  Diagnosis Date   CHF (congestive heart failure) (HCC)    Heart failure (HCC)    Hypertension    Hyponatremia    Hypothyroidism    Shoulder pain     Past Surgical History:  Procedure Laterality Date   HERNIA REPAIR     NO PAST SURGERIES     RIGHT/LEFT HEART CATH AND CORONARY ANGIOGRAPHY N/A 11/16/2019   Procedure: RIGHT/LEFT HEART CATH AND CORONARY ANGIOGRAPHY;  Surgeon: Dolores Patty, MD;  [facility];  Service: Cardiovascular;  Laterality: N/A;    Social History   Socioeconomic History   Marital status: Married    Spouse name: Not on file   Number of children: Not on file   Years of education: Not on file   Highest education level: Not on file  Occupational History   Not on file  Tobacco Use   Smoking status: Never   Smokeless tobacco: Never  Substance and Sexual Activity   Alcohol use: Not Currently   Drug use: Never   Sexual activity: Not on file  Other Topics Concern   Not on file  Social History Narrative   Not on file   Social Determinants of Health   Financial Resource Strain: Not on file  Food Insecurity: Not on file  Transportation  Needs: Not on file  Physical Activity: Not on file  Stress: Not on file  Social Connections: Not on file  Intimate Partner Violence: Not on file   No family history on file.  Current Outpatient Medications  Medication Sig Dispense Refill   aspirin EC 81 MG EC tablet Take 1 tablet (81 mg total) by mouth daily. Swallow whole. 30 tablet 11   dapagliflozin propanediol (FARXIGA) 10 MG TABS tablet Take 1 tablet (10 mg total) by mouth daily. 90 tablet 0   Diclofenac Sodium 3 % GEL Apply 1 application topically to affected area 2 times daily. 100 g 0   doxycycline (VIBRAMYCIN) 50 MG capsule Take 50 mg by mouth 2 (two) times daily. (Patient not taking: Reported on 11/07/2020)     FARXIGA 10 MG TABS tablet TAKE 1 TABLET BY MOUTH BEFORE BREAKFAST (Patient not taking: Reported on 11/07/2020) 90 tablet 0   folic acid (FOLVITE) 1 MG tablet Take 1 tablet (1 mg total) by mouth daily. 30 tablet 0   hydrocortisone (CORTEF) 10 MG tablet Take 3 tablets (30 mg total) by mouth daily. 30 tablet 0   hydrocortisone (CORTEF) 10 MG tablet Take 1 tablet (10 mg total) by mouth daily with breakfast. 100 tablet 0   hydrocortisone (CORTEF) 5 MG  tablet Take 1 tablet (5 mg total) by mouth every evening. 100 tablet 0   isosorbide-hydrALAZINE (BIDIL) 20-37.5 MG tablet TAKE 1 TABLET BY MOUTH 3 (THREE) TIMES DAILY. 90 tablet 11   levothyroxine (SYNTHROID) 50 MCG tablet Take 1 tablet (50 mcg total) by mouth daily. 30 tablet 5   MULTIPLE VITAMIN PO Take 1 tablet by mouth daily.     rosuvastatin (CRESTOR) 10 MG tablet Take 1 tablet (10 mg total) by mouth daily. 90 tablet 3   sacubitril-valsartan (ENTRESTO) 97-103 MG Take 1 tablet by mouth 2 (two) times daily. 60 tablet 11   spironolactone (ALDACTONE) 25 MG tablet Take 1 tablet (25 mg total) by mouth at bedtime. 90 tablet 3   torsemide (DEMADEX) 20 MG tablet Take 1 tablet (20 mg total) by mouth daily. 90 tablet 3   No current facility-administered medications for this visit.     No Known Allergies   REVIEW OF SYSTEMS:   [X]  denotes positive finding, [ ]  denotes negative finding Cardiac  Comments:  Chest pain or chest pressure:    Shortness of breath upon exertion:    Short of breath when lying flat:    Irregular heart rhythm:        Vascular    Pain in calf, thigh, or hip brought on by ambulation:    Pain in feet at night that wakes you up from your sleep:     Blood clot in your veins:    Leg swelling:         Pulmonary    Oxygen at home:    Productive cough:     Wheezing:         Neurologic    Sudden weakness in arms or legs:     Sudden numbness in arms or legs:     Sudden onset of difficulty speaking or slurred speech:    Temporary loss of vision in one eye:     Problems with dizziness:         Gastrointestinal    Blood in stool:     Vomited blood:         Genitourinary    Burning when urinating:     Blood in urine:        Psychiatric    Major depression:         Hematologic    Bleeding problems:    Problems with blood clotting too easily:        Skin    Rashes or ulcers:        Constitutional    Fever or chills:      PHYSICAL EXAMINATION:  There were no vitals filed for this visit.  General:  WDWN in NAD; vital signs documented above Gait: Not observed HENT: WNL, normocephalic Pulmonary: normal non-labored breathing , without Rales, rhonchi,  wheezing Cardiac: regular HR,  Abdomen: soft, NT, no masses Skin: without rashes Vascular Exam/Pulses:  Right Left  Radial 2+ (normal) 2+ (normal)  Ulnar 2+ (normal) 2+ (normal)  Femoral    Popliteal    DP 2+ (normal) 2+ (normal)  PT 1+ (weak) 1+ (weak)   Extremities: without ischemic changes, without Gangrene , without cellulitis; without open wounds;  Musculoskeletal: no muscle wasting or atrophy  Neurologic: A&O X 3;  No focal weakness or paresthesias are detected Psychiatric:  The pt has Normal affect.   Non-Invasive Vascular Imaging:   Left:  - No evidence of  deep vein thrombosis seen in the left lower extremity,  from the common femoral through the popliteal veins.  - No evidence of superficial venous thrombosis in the left lower  extremity.  - There is no evidence of venous reflux seen in the left lower extremity.     ASSESSMENT/PLAN:: 65 y.o. male presenting with superficial veins.  The patient has thin skin, making his superficial veins more pronounced.  This is multifactorial and is seen in individuals with low BMI's who are elderly.  Left lower extremity venous duplex demonstrated no concerns for DVT, reflux.  Patient has no signs or symptoms of claudication, rest pain, tissue loss in the lower extremities.  Patient can follow-up with me as needed   Victorino Sparrow, MD [facility] [phone]

## 2020-12-14 ENCOUNTER — Other Ambulatory Visit: Payer: Self-pay

## 2020-12-14 ENCOUNTER — Other Ambulatory Visit (HOSPITAL_COMMUNITY): Payer: Self-pay

## 2020-12-14 ENCOUNTER — Encounter: Payer: Self-pay | Admitting: Internal Medicine

## 2020-12-14 ENCOUNTER — Ambulatory Visit: Payer: Medicaid Other | Admitting: Internal Medicine

## 2020-12-14 VITALS — BP 152/90 | HR 84 | Ht 64.0 in | Wt 161.0 lb

## 2020-12-14 DIAGNOSIS — E039 Hypothyroidism, unspecified: Secondary | ICD-10-CM | POA: Diagnosis not present

## 2020-12-14 DIAGNOSIS — E2740 Unspecified adrenocortical insufficiency: Secondary | ICD-10-CM

## 2020-12-14 LAB — BASIC METABOLIC PANEL
BUN: 25 mg/dL — ABNORMAL HIGH (ref 6–23)
CO2: 30 mEq/L (ref 19–32)
Calcium: 9.4 mg/dL (ref 8.4–10.5)
Chloride: 102 mEq/L (ref 96–112)
Creatinine, Ser: 1.43 mg/dL (ref 0.40–1.50)
GFR: 51.69 mL/min — ABNORMAL LOW (ref >60.00)
Glucose, Bld: 108 mg/dL — ABNORMAL HIGH (ref 70–99)
Potassium: 4.4 mEq/L (ref 3.5–5.1)
Sodium: 143 mEq/L (ref 135–145)

## 2020-12-14 LAB — TSH: TSH: 0.62 u[IU]/mL (ref 0.35–5.50)

## 2020-12-14 LAB — T4, FREE: Free T4: 0.72 ng/dL (ref 0.60–1.60)

## 2020-12-14 MED ORDER — HYDROCORTISONE 10 MG PO TABS
10.0000 mg | ORAL_TABLET | Freq: Every day | ORAL | 3 refills | Status: DC
  Filled 2020-12-14: qty 100, 100d supply, fill #0
  Filled 2020-12-28: qty 30, 30d supply, fill #0
  Filled 2021-01-24: qty 30, 30d supply, fill #1
  Filled 2021-02-19: qty 30, 30d supply, fill #2

## 2020-12-14 MED ORDER — HYDROCORTISONE 5 MG PO TABS
5.0000 mg | ORAL_TABLET | ORAL | 3 refills | Status: DC
  Filled 2020-12-14: qty 100, fill #0
  Filled 2020-12-28: qty 30, 30d supply, fill #0
  Filled 2021-02-02: qty 30, 30d supply, fill #1
  Filled 2021-02-19 – 2021-03-02 (×2): qty 30, 30d supply, fill #2

## 2020-12-14 MED ORDER — LEVOTHYROXINE SODIUM 50 MCG PO TABS
50.0000 ug | ORAL_TABLET | Freq: Every day | ORAL | 3 refills | Status: DC
  Filled 2020-12-14: qty 90, 90d supply, fill #0

## 2020-12-14 NOTE — Patient Instructions (Signed)
-   Continue Hydrocortisone 10 mg, 1 tablet  with Breakfast  - Continue Hydrocortisone 5 mg, 1 tablet between 2 and 4 pm  - Continue levothyroxine 50 mcg daily        - ADRENAL INSUFFICIENCY SICK DAY RULES:  Should you face an extreme emotional or physical stress such as trauma, surgery or acute illness, this will require extra steroid coverage so that the body can meet that stress.   Without increasing the steroid dose you may experience severe weakness, headache, dizziness, nausea and vomiting and possibly a more serious deterioration in health.  Typically the dose of steroids will only need to be increased for a couple of days if you have an illness that is transient and managed in the community.   If you are unable to take/absorb an increased dose of steroids orally because of vomiting or diarrhea, you will urgently require steroid injections and should present to an Emergency Department.  The general advice for any serious illness is as follows: Double the normal daily steroid dose for up to 3 days if you have a temperature of more than 37.50C (99.50F) with signs of sickness, or severe emotional or physical distress Contact your primary care doctor and Endocrinologist if the illness worsens or it lasts for more than 3 days.  In cases of severe illness, urgent medical assistance should be promptly sought. If you experience vomiting/diarrhea or are unable to take steroids by mouth, please present to emergency room    

## 2020-12-14 NOTE — Progress Notes (Signed)
Name: Steven Frazier  MRN/ DOB: [id number], July 25, 1955    Age/ Sex: 65 y.o., male     PCP: Norm Salt, PA   Reason for Endocrinology Evaluation: Secondary adrenal insufficieincy     Initial Endocrinology Clinic Visit: 02/08/2020    PATIENT IDENTIFIER: Steven Frazier is a 65 y.o., male with a past medical history of CHF and HTN . He has followed with [facility] since 02/08/2120 for consultative assistance with management of his adrenal insufficiency.   HISTORICAL SUMMARY:   Pt presented to the ED in 08/2019 with hypervolemic hyponatremia ( Sodium 117 mmol/L) with low albumin at < 2 g/dL, low serum osmolality at 245 mOsm/Kg , TSH slightly elevated at 9.4 uIU/mL . Urine sodium was 26 mg/dL with a urine osmolality at 284 .   Pt had a random serum cortisol of 0.4 ug/dL on 29/93/7169 and was started on hydrocortisone  ACTH was undetectable in 01/2020      Moved from [place] , [place] ~ 2020   No prior use of steroids     SUBJECTIVE:   Today (12/14/2020):  [name] is here for adrenal insufficiency. He is accompanied by his son today.    He denies dizziness Denies headaches or vision changes Has denies constipation and diarrhea  Has noted odynophagia     Has been wearing a medical alert bracelet   HC 10 mg, 1 tablet with Breakfast  HC 5 mg, 1 tablet between 2-4 pm  Levothyroxine 50 mcg daily     HISTORY:  Past Medical History:  Past Medical History:  Diagnosis Date   CHF (congestive heart failure) (HCC)    Heart failure (HCC)    Hypertension    Hyponatremia    Hypothyroidism    Shoulder pain    Past Surgical History:  Past Surgical History:  Procedure Laterality Date   HERNIA REPAIR     NO PAST SURGERIES     RIGHT/LEFT HEART CATH AND CORONARY ANGIOGRAPHY N/A 11/16/2019   Procedure: RIGHT/LEFT HEART CATH AND CORONARY ANGIOGRAPHY;  Surgeon: Dolores Patty, MD;  Location: [facility];  Service: Cardiovascular;  Laterality: N/A;    Social History:  reports that he has never smoked. He has never used smokeless tobacco. He reports that he does not currently use alcohol. He reports that he does not use drugs. Family History: No family history on file.   HOME MEDICATIONS: Allergies as of 12/14/2020   No Known Allergies      Medication List        Accurate as of December 14, 2020  7:27 AM. If you have any questions, ask your nurse or doctor.          aspirin 81 MG EC tablet Take 1 tablet (81 mg total) by mouth daily. Swallow whole.   BiDil 20-37.5 MG tablet Generic drug: isosorbide-hydrALAZINE TAKE 1 TABLET BY MOUTH 3 (THREE) TIMES DAILY.   Diclofenac Sodium 3 % Gel Apply 1 application topically to affected area 2 times daily.   doxycycline 50 MG capsule Commonly known as: VIBRAMYCIN Take 50 mg by mouth 2 (two) times daily.   Entresto 97-103 MG Generic drug: sacubitril-valsartan Take 1 tablet by mouth 2 (two) times daily.   Farxiga 10 MG Tabs tablet Generic drug: dapagliflozin propanediol TAKE 1 TABLET BY MOUTH BEFORE BREAKFAST   Farxiga 10 MG Tabs tablet Generic drug: dapagliflozin propanediol Take 1 tablet (10 mg total) by mouth daily.   folic acid 1 MG tablet Commonly known  as: FOLVITE Take 1 tablet (1 mg total) by mouth daily.   hydrocortisone 10 MG tablet Commonly known as: CORTEF Take 3 tablets (30 mg total) by mouth daily.   hydrocortisone 10 MG tablet Commonly known as: CORTEF Take 1 tablet (10 mg total) by mouth daily with breakfast.   hydrocortisone 5 MG tablet Commonly known as: CORTEF Take 1 tablet (5 mg total) by mouth every evening.   levothyroxine 50 MCG tablet Commonly known as: SYNTHROID Take 1 tablet (50 mcg total) by mouth daily.   MULTIPLE VITAMIN PO Take 1 tablet by mouth daily.   rosuvastatin 10 MG tablet Commonly known as: CRESTOR Take 1 tablet (10 mg total) by mouth daily.   spironolactone 25 MG tablet Commonly known as: ALDACTONE Take 1 tablet  (25 mg total) by mouth at bedtime.   torsemide 20 MG tablet Commonly known as: DEMADEX Take 1 tablet (20 mg total) by mouth daily.          OBJECTIVE:   PHYSICAL EXAM: VS: There were no vitals taken for this visit.   EXAM: General: Pt appears well and is in NAD  Neck: General: Supple without adenopathy. Thyroid: Thyroid size normal.  No goiter or nodules appreciated.   Lungs: Clear with good BS bilat with no rales, rhonchi, or wheezes  Heart: Auscultation: RRR.  Abdomen: Normoactive bowel sounds, soft, nontender, without masses or organomegaly palpable  Extremities:  BL LE: No pretibial edema normal ROM and strength.  Mental Status: Judgment, insight: Intact Memory: Intact for recent and remote events Mood and affect: No depression, anxiety, or agitation     DATA REVIEWED:  Results for YVON, MCCORD (MRN [id number]) as of 12/14/2020 16:10  Ref. Range 12/14/2020 09:31  Sodium Latest Ref Range: 135 - 145 mEq/L 143  Potassium Latest Ref Range: 3.5 - 5.1 mEq/L 4.4  Chloride Latest Ref Range: 96 - 112 mEq/L 102  CO2 Latest Ref Range: 19 - 32 mEq/L 30  Glucose Latest Ref Range: 70 - 99 mg/dL 491 (H)  BUN Latest Ref Range: 6 - 23 mg/dL 25 (H)  Creatinine Latest Ref Range: 0.40 - 1.50 mg/dL 7.91  Calcium Latest Ref Range: 8.4 - 10.5 mg/dL 9.4  GFR Latest Ref Range: >60.00 mL/min 51.69 (L)  TSH Latest Ref Range: 0.35 - 5.50 uIU/mL 0.62  T4,Free(Direct) Latest Ref Range: 0.60 - 1.60 ng/dL 5.05         ASSESSMENT / PLAN / RECOMMENDATIONS:   Secondary Adrenal Insufficiency:  - Pt has been on hydrocortisone since 10/2019 with a low random serum cortisol of 0.4 ug/dL - He already has a medical alert bracelet - Recent BMP shows normal K and Na, stable GFR - Discussed sick day rule  -Prolactin and testosterone levels have been normal, slight elevation of LH 05/2020   Medications HC 10 mg, 1 tablet with Breakfast  HC 5 mg, 1 tablet between 2-4 pm      2. Hypothyroidism:     - Pt is clinically euthyroid  - TSH and FT4 normal      Medications : Continue Levothyroxine 50 mcg daily    3. Elevated BP:  -BP trended down during his visit, patient did run out of his medication for approximately 1 week. -Encourage compliance  Follow-up 1 year Signed electronically by: Lyndle Herrlich, MD  [facility] Endocrinology  [facility] [address] [place] [place], [place] [address] Phone: [phone] FAX: [phone]      CC: Norm Salt, [place] [address] [place] [address] Phone: [phone]  Fax: [phone]   Return to Endocrinology clinic as below: Future Appointments  Date Time Provider Department Center  12/14/2020  9:50 AM Nylene Inlow, Konrad Dolores, MD [facility] None  12/16/2020  9:00 AM [facility] - [facility]  12/16/2020 10:00 AM Victorino Sparrow, MD [facility]  12/20/2020 10:40 AM Bensimhon, Bevelyn Buckles, MD [facility] None

## 2020-12-16 ENCOUNTER — Ambulatory Visit: Payer: Medicaid Other | Admitting: Vascular Surgery

## 2020-12-16 ENCOUNTER — Ambulatory Visit (HOSPITAL_COMMUNITY)
Admission: RE | Admit: 2020-12-16 | Discharge: 2020-12-16 | Disposition: A | Discharge status: Home / Self Care | Payer: Medicaid Other | Source: Ambulatory Visit | Attending: Vascular Surgery | Admitting: Vascular Surgery

## 2020-12-16 ENCOUNTER — Encounter: Payer: Self-pay | Admitting: Vascular Surgery

## 2020-12-16 ENCOUNTER — Other Ambulatory Visit: Payer: Self-pay

## 2020-12-16 VITALS — BP 144/95 | HR 88 | Temp 97.9°F | Resp 20 | Ht 64.0 in | Wt 159.0 lb

## 2020-12-16 DIAGNOSIS — M7989 Other specified soft tissue disorders: Secondary | ICD-10-CM | POA: Insufficient documentation

## 2020-12-20 ENCOUNTER — Other Ambulatory Visit: Payer: Self-pay

## 2020-12-20 ENCOUNTER — Other Ambulatory Visit (HOSPITAL_COMMUNITY): Payer: Self-pay

## 2020-12-20 ENCOUNTER — Encounter (HOSPITAL_COMMUNITY): Payer: Self-pay | Admitting: Internal Medicine

## 2020-12-20 ENCOUNTER — Ambulatory Visit (HOSPITAL_COMMUNITY)
Admission: RE | Admit: 2020-12-20 | Discharge: 2020-12-20 | Disposition: A | Discharge status: Home / Self Care | Payer: Medicaid Other | Source: Ambulatory Visit | Attending: Internal Medicine | Admitting: Internal Medicine

## 2020-12-20 VITALS — BP 146/70 | HR 74 | Wt 154.4 lb

## 2020-12-20 DIAGNOSIS — I440 Atrioventricular block, first degree: Secondary | ICD-10-CM | POA: Diagnosis not present

## 2020-12-20 DIAGNOSIS — E2740 Unspecified adrenocortical insufficiency: Secondary | ICD-10-CM | POA: Insufficient documentation

## 2020-12-20 DIAGNOSIS — E039 Hypothyroidism, unspecified: Secondary | ICD-10-CM | POA: Diagnosis not present

## 2020-12-20 DIAGNOSIS — Z7984 Long term (current) use of oral hypoglycemic drugs: Secondary | ICD-10-CM | POA: Insufficient documentation

## 2020-12-20 DIAGNOSIS — I110 Hypertensive heart disease with heart failure: Secondary | ICD-10-CM | POA: Insufficient documentation

## 2020-12-20 DIAGNOSIS — I3139 Other pericardial effusion (noninflammatory): Secondary | ICD-10-CM | POA: Diagnosis not present

## 2020-12-20 DIAGNOSIS — I5022 Chronic systolic (congestive) heart failure: Secondary | ICD-10-CM | POA: Diagnosis not present

## 2020-12-20 DIAGNOSIS — I428 Other cardiomyopathies: Secondary | ICD-10-CM | POA: Diagnosis not present

## 2020-12-20 DIAGNOSIS — Z79899 Other long term (current) drug therapy: Secondary | ICD-10-CM | POA: Diagnosis not present

## 2020-12-20 MED ORDER — ENTRESTO 97-103 MG PO TABS
1.0000 | ORAL_TABLET | Freq: Two times a day (BID) | ORAL | 11 refills | Status: DC
  Filled 2020-12-20 – 2021-01-03 (×2): qty 60, 30d supply, fill #0
  Filled 2021-01-29: qty 60, 30d supply, fill #1
  Filled 2021-03-02 – 2021-03-07 (×3): qty 60, 30d supply, fill #2

## 2020-12-20 MED ORDER — DAPAGLIFLOZIN PROPANEDIOL 10 MG PO TABS
10.0000 mg | ORAL_TABLET | Freq: Every day | ORAL | 0 refills | Status: DC
  Filled 2020-12-20: qty 90, 90d supply, fill #0

## 2020-12-20 MED ORDER — ISOSORB DINITRATE-HYDRALAZINE 20-37.5 MG PO TABS
2.0000 | ORAL_TABLET | Freq: Three times a day (TID) | ORAL | 11 refills | Status: DC
  Filled 2020-12-20: qty 90, 15d supply, fill #0
  Filled 2021-01-03: qty 180, 30d supply, fill #0
  Filled 2021-03-04 – 2021-03-07 (×3): qty 180, 30d supply, fill #1

## 2020-12-20 MED ORDER — LEVOTHYROXINE SODIUM 50 MCG PO TABS
50.0000 ug | ORAL_TABLET | Freq: Every day | ORAL | 3 refills | Status: DC
  Filled 2020-12-20: qty 90, 90d supply, fill #0

## 2020-12-20 NOTE — Progress Notes (Addendum)
Advanced Heart Failure Clinic Note    PCP: Trey Sailors, PA PCP-Cardiologist: Dr. Haroldine Laws   Reason for Visit: Heart Failure   HPI: Steven Frazier is a 65 y.o. male from [place] ([place]ig[place] [birth year][place] with systolic HF (EF 36-46%), HTN, and hypothyroidism.    Admitted 8/21 with hypervolemic hyponatremia in the setting of systolic HF with Na 803. Also noted to have unintentional significant weight loss (~20-25 pounds), low albumin, microcytic anemia and eosinophilia. Work-up including peripheral smear, HIV, hepatitis and parasitic screens negative.    Readmitted 10/21 with marked volume overload. Had [facility] showing severe NICM EF 20%, with normal coronaries and mildly elevated filling pressures w/ normal cardiac output. Found to have adrenal insuffiencey. Cortisol level 0.4. D/w endocrinology who recommended hydrocortisone 10 mg qam/ 5 mg pm + brain MRI w/ pituitary protocol. MRI was unremarkable.   cMRI 9/22 1. Mild LVE diffuse hypokinesis EF 44% improved from 11/17/19 measured at 37% then 2. Persistent mid myocardial LGE more impressivein antero and inferolateral walls compared to previous study including some mottled uptake in the septum and apex 3.  Small lateral pericardial effusion 4. Abnormal parametrics including elevated T1 mildly elevated T2 in regions with gadolinium uptake and elevated ECV ? Sarcoid vs infiltrative cardiomyopathy.  5.  Normal RV size and function   Here with son. Says he feels fine. Able to do ADLs without a problem. No CP, edema, orthopnea or PND.   Cardiac Studies  Echo 03/2020 EF 45-50%  Echo 8/21: EF 40%. RV mildly reduced   R/LHC 10/21:  Ao = 123/72 (94) LV = 122/15 RA = 9 RV = 48/12 PA = 49/17 (31) PCW = 22 (v = 31) Fick cardiac output/index = 4.1/2.4 PVR = 2.2 WU  FA sat = 99% PA sat = 68%, 69% PAPi = 3.5  Assessment: 1. Severe NICM EF 20% 2. Normal coronaries 3. Mildly elevated filling pressures with normal CO  cMRI 10/21 1.   Normal LV size with EF 37%, diffuse hypokinesis. 2. Moderately dilated RV with EF 34%, moderately decreased systolic function. 3. Non-coronary pattern LGE. The LGE is extensive and mid-wall, suggesting possible prior myocarditis versus infiltrative disease. Lack of LV hypertrophy and ECV % < 40% makes cardiac amyloidosis less likely.  Review of systems complete and found to be negative unless listed in HPI.     Past Medical History:  Diagnosis Date   CHF (congestive heart failure) (HCC)    Heart failure (HCC)    Hypertension    Hyponatremia    Hypothyroidism    Shoulder pain     Current Outpatient Medications  Medication Sig Dispense Refill   aspirin EC 81 MG EC tablet Take 1 tablet (81 mg total) by mouth daily. Swallow whole. 30 tablet 11   dapagliflozin propanediol (FARXIGA) 10 MG TABS tablet Take 1 tablet (10 mg total) by mouth daily. 90 tablet 0   Diclofenac Sodium 3 % GEL Apply 1 application topically to affected area 2 times daily. (Patient taking differently: Apply 1 application topically to affected area 2 times daily as needed.) 212 g 0   folic acid (FOLVITE) 1 MG tablet Take 1 tablet (1 mg total) by mouth daily. 30 tablet 0   hydrocortisone (CORTEF) 10 MG tablet Take 1 tablet (10 mg total) by mouth daily with breakfast. 100 tablet 3   hydrocortisone (CORTEF) 5 MG tablet Take 1 tablet (5 mg total) by mouth as directed. Daily between 2-4 pm 100 tablet 3   isosorbide-hydrALAZINE (BIDIL)  20-37.5 MG tablet TAKE 1 TABLET BY MOUTH 3 (THREE) TIMES DAILY. 90 tablet 11   levothyroxine (SYNTHROID) 50 MCG tablet Take 1 tablet (50 mcg total) by mouth daily. 90 tablet 3   MULTIPLE VITAMIN PO Take 1 tablet by mouth daily.     rosuvastatin (CRESTOR) 10 MG tablet Take 1 tablet (10 mg total) by mouth daily. 90 tablet 3   sacubitril-valsartan (ENTRESTO) 97-103 MG Take 1 tablet by mouth 2 (two) times daily. 60 tablet 11   spironolactone (ALDACTONE) 25 MG tablet Take 1 tablet (25 mg total) by  mouth at bedtime. 90 tablet 3   torsemide (DEMADEX) 20 MG tablet Take 1 tablet (20 mg total) by mouth daily. 90 tablet 3   No current facility-administered medications for this encounter.    No Known Allergies    Social History   Socioeconomic History   Marital status: Married    Spouse name: Not on file   Number of children: Not on file   Years of education: Not on file   Highest education level: Not on file  Occupational History   Not on file  Tobacco Use   Smoking status: Never   Smokeless tobacco: Never  Vaping Use   Vaping Use: Never used  Substance and Sexual Activity   Alcohol use: Not Currently   Drug use: Never   Sexual activity: Not on file  Other Topics Concern   Not on file  Social History Narrative   Not on file   Social Determinants of Health   Financial Resource Strain: Not on file  Food Insecurity: Not on file  Transportation Needs: Not on file  Physical Activity: Not on file  Stress: Not on file  Social Connections: Not on file  Intimate Partner Violence: Not on file     No family history on file.  Vitals:   12/20/20 1043  BP: (!) 146/70  Pulse: 74  SpO2: 95%  Weight: 70 kg (154 lb 6.4 oz)   Wt Readings from Last 3 Encounters:  12/20/20 70 kg (154 lb 6.4 oz)  12/16/20 72.1 kg (159 lb)  12/14/20 73 kg (161 lb)     PHYSICAL EXAM: General:  Well appearing. No resp difficulty HEENT: normal Neck: supple. no JVD. Carotids 2+ bilat; no bruits. No lymphadenopathy or thryomegaly appreciated. Cor: PMI nondisplaced. Regular rate & rhythm. No rubs, gallops or murmurs. Lungs: clear Abdomen: soft, nontender, nondistended. No hepatosplenomegaly. No bruits or masses. Good bowel sounds. Extremities: no cyanosis, clubbing, rash, edema prominent varicose veins  Neuro: alert & orientedx3, cranial nerves grossly intact. moves all 4 extremities w/o difficulty. Affect pleasant    EKG: NSR 83 with marked 1AVB (290 ms) diffuse repolarization  abnormalities +PACs. Personally reviewed    ASSESSMENT & PLAN:  1.  Chronic Systolic Heart Failure - Echo 8/21 showed EF 40%. RV mildly reduced ---> ECHO 03/2020 EF 45-50%  - R/LHC 10/21 showed severe NICM EF 20%, with normal coronaries and mildly elevated filling pressures w/ normal cardiac output. - cMRI 10/21 suggestive of previous myocarditis vs infiltrative. EF 37% - PYP visual and quantitative assessment (grade 1 and H/CL = 1.18) are equivocal for transthyretin amyloidosis => probably not TTR amyloidosis. - auto-immune serologies negative. ESR 5, SPEP/serum light chains negative - cMRI 9/22 EF 44%. Persistent mid myocardial LGE more impressivein antero and inferolateral walls compared to previous study including some mottled uptake in the septum and apex Abnormal parametrics including elevated T1 mildly elevated T2 in regions with gadolinium uptake and  elevated ECV ? Sarcoid vs infiltrative cardiomyopathy. Normal RV size and function - Etiology of HF remains unclear. ? secondary adrenocortical insufficiency as etiology of HF. Serum cortisol level was markedly low at 0.4 (10/21)>>now on hydrocortisone 10 mg qam/ 5 mg pm. Brain MRI unrevealing. cMRI concerning for possible previous myocarditis vs sarcoid. Can consider PET but sarcoid would be treated by hydrocortisone so not sure it would change management. No evidence of pulmonary sarcoid on CT 10/21 - Stable NYHA II - Volume status stable. Check labs today. - Continue torsemide 20 mg daily  - Continue spiro 25 daily - Continue Farxiga 10 mg daily - Continue Entresto 97-103 mg bid.    - Increase Bidil to 2 tablet tid  - Have avoided ? blocker w/ marked 1st degree AVB - Check labs today   2. Adrenal Insuffiencey  - Serum Cortisol markedly low 0.4 10/21  - brain MRI w/ pituitary protocol unremarkable. - C/w hydrocortisone 10 mg qam/ 5 mg pm,  - Followed by  Endocrinology.   3. HTN  -Blood pressure elevated. Increase Bidil  4.  Marked 1 AVB - stable.  Glori Bickers, MD  11:24 AM

## 2020-12-20 NOTE — Patient Instructions (Signed)
Increase Bidil 2 tablets Three times a day  Your physician recommends that you schedule a follow-up appointment in: 6 months (May 2023) **Call in March for appointment**  If you have any questions or concerns before your next appointment please send [place] a message through [place] or call our office at [phone].    TO LEAVE A MESSAGE FOR THE NURSE SELECT OPTION 2, PLEASE LEAVE A MESSAGE INCLUDING: YOUR NAME DATE OF BIRTH CALL BACK NUMBER REASON FOR CALL**this is important as we prioritize the call backs  YOU WILL RECEIVE A CALL BACK THE SAME DAY AS LONG AS YOU CALL BEFORE 4:00 PM At the [facility], you and your health needs are our priority. As part of our continuing mission to provide you with exceptional heart care, we have created designated Provider Care Teams. These Care Teams include your primary Cardiologist (physician) and Advanced Practice Providers (APPs- Physician Assistants and Nurse Practitioners) who all work together to provide you with the care you need, when you need it.   You may see any of the following providers on your designated Care Team at your next follow up: Dr Arvilla Meres Dr Carron Curie, NP Robbie Lis, [place] [facility] [place], [place] Karle Plumber, PharmD   Please be sure to bring in all your medications bottles to every appointment.

## 2020-12-29 ENCOUNTER — Other Ambulatory Visit (HOSPITAL_COMMUNITY): Payer: Self-pay

## 2021-01-04 ENCOUNTER — Other Ambulatory Visit (HOSPITAL_COMMUNITY): Payer: Self-pay

## 2021-01-24 ENCOUNTER — Other Ambulatory Visit (HOSPITAL_COMMUNITY): Payer: Self-pay

## 2021-01-30 ENCOUNTER — Other Ambulatory Visit (HOSPITAL_COMMUNITY): Payer: Self-pay

## 2021-02-03 ENCOUNTER — Other Ambulatory Visit (HOSPITAL_COMMUNITY): Payer: Self-pay

## 2021-02-20 ENCOUNTER — Other Ambulatory Visit (HOSPITAL_COMMUNITY): Payer: Self-pay

## 2021-02-27 ENCOUNTER — Other Ambulatory Visit (HOSPITAL_COMMUNITY): Payer: Self-pay

## 2021-03-02 ENCOUNTER — Other Ambulatory Visit (HOSPITAL_COMMUNITY): Payer: Self-pay

## 2021-03-03 ENCOUNTER — Other Ambulatory Visit (HOSPITAL_COMMUNITY): Payer: Self-pay

## 2021-03-06 ENCOUNTER — Other Ambulatory Visit (HOSPITAL_COMMUNITY): Payer: Self-pay | Admitting: *Deleted

## 2021-03-06 ENCOUNTER — Other Ambulatory Visit (HOSPITAL_COMMUNITY): Payer: Self-pay

## 2021-03-07 ENCOUNTER — Other Ambulatory Visit (HOSPITAL_COMMUNITY): Payer: Self-pay

## 2021-03-07 ENCOUNTER — Telehealth (HOSPITAL_COMMUNITY): Payer: Self-pay | Admitting: Licensed Clinical Social Worker

## 2021-03-07 ENCOUNTER — Other Ambulatory Visit (HOSPITAL_COMMUNITY): Payer: Self-pay | Admitting: *Deleted

## 2021-03-07 MED ORDER — SPIRONOLACTONE 25 MG PO TABS: 25.0000 mg | ORAL_TABLET | Freq: Every day | ORAL | 3 refills | Status: DC

## 2021-03-07 MED ORDER — TORSEMIDE 20 MG PO TABS: 20.0000 mg | ORAL_TABLET | Freq: Every day | ORAL | 3 refills | Status: DC

## 2021-03-07 MED ORDER — ISOSORB DINITRATE-HYDRALAZINE 20-37.5 MG PO TABS: 2.0000 | ORAL_TABLET | Freq: Three times a day (TID) | ORAL | 3 refills | Status: DC

## 2021-03-07 MED ORDER — DAPAGLIFLOZIN PROPANEDIOL 10 MG PO TABS: 10.0000 mg | ORAL_TABLET | Freq: Every day | ORAL | 3 refills | Status: DC

## 2021-03-07 MED ORDER — ENTRESTO 97-103 MG PO TABS: 1.0000 | ORAL_TABLET | Freq: Two times a day (BID) | ORAL | 3 refills | Status: DC

## 2021-03-07 NOTE — Telephone Encounter (Signed)
Pt dtr reported she spoke with Medicaid and they gave her a discount card for pt to use at a community pharmacy until pharmacy benefits start for pt Medicare in April- requests we send medications to [facility] on file- CSW sent message to clinic RN informing of request  Burna Sis, LCSW Clinical Social Worker [facility] Desk#: [phone] Cell#: [phone]

## 2021-03-07 NOTE — Telephone Encounter (Signed)
CSW informed pt dtr called to inquire about pt getting medications due to current insurance concerns.  Pt has Medicaid and normally gets his medications through this benefit but turns 66 this month so Medicare part A and B are now active.  Also has part D plan that will become active in April per dtr.  When they called pharmacy to get refills they state that Medicaid won't pay for it because it was flagged as having a primary payor source.  Spoke with clinic pt advocate who states pt dtr will need to call Medicaid to see if they can remove this flag since alternative prescription coverage won't be active until April- she will attempt to call and do this and will let CSW know if they are unable to change and they need help getting pt medications.  Burna Sis, LCSW Clinical Social Worker [facility] Desk#: [phone] Cell#: [phone]

## 2021-03-21 ENCOUNTER — Other Ambulatory Visit: Payer: Self-pay | Admitting: Neurological Surgery

## 2021-03-23 ENCOUNTER — Other Ambulatory Visit (HOSPITAL_COMMUNITY): Payer: Self-pay

## 2021-03-24 ENCOUNTER — Telehealth: Payer: Self-pay | Admitting: Physician Assistant

## 2021-03-24 NOTE — Telephone Encounter (Signed)
[facility] called to get permission to change the manufacturer of his levothyroxine.  No dose change.  Permission given.  Theodore Demark, PA-C 03/24/2021 6:03 PM

## 2021-03-30 NOTE — Progress Notes (Signed)
Surgical Instructions ? ? ? Your procedure is scheduled on Tuesday,March 7. ? Report to [facility] Main Entrance "A" at 10:45 A.M., then check in with the Admitting office. ? Call this number if you have problems the morning of surgery: ? [phone] ? ? If you have any questions prior to your surgery date call [phone]: Open Monday-Friday 8am-4pm ? ? ? Remember: ? Do not eat after midnight the night before your surgery ? ?You may drink clear liquids until 9:45am the morning of your surgery.   ?Clear liquids allowed are: Water, Non-Citrus Juices (without pulp), Carbonated Beverages, Clear Tea, Black Coffee ONLY (NO MILK, CREAM OR POWDERED CREAMER of any kind), and Gatorade ?  ? Take these medicines the morning of surgery with A SIP OF WATER:  ?hydrocortisone (CORTEF) 10 MG ?isosorbide-hydrALAZINE (BIDIL) ?levothyroxine (SYNTHROID) ?rosuvastatin (CRESTOR)  ? ?HOLD dapagliflozin propanediol (FARXIGA) THE DAY BEFORE SURGERY AND THE DAY OF SURGERY. ? ?As of today, STOP taking any Aspirin (unless otherwise instructed by your surgeon) Aleve, Naproxen, Ibuprofen, Motrin, Advil, Goody's, BC's, all herbal medications, fish oil, and all vitamins. ? ?         ?Do not wear jewelry or makeup ?Do not wear lotions, powders, perfumes/colognes, or deodorant. ?Do not shave 48 hours prior to surgery.  Men may shave face and neck. ?Do not bring valuables to the hospital. ?Do not wear nail polish, gel polish, artificial nails, or any other type of covering on natural nails (fingers and toes) ?If you have artificial nails or gel coating that need to be removed by a nail salon, please have this removed prior to surgery. Artificial nails or gel coating may interfere with anesthesia's ability to adequately monitor your vital signs. ? ?[facility] is not responsible for any belongings or valuables. .  ? ?Do NOT Smoke (Tobacco/Vaping)  24 hours prior to your procedure ? ?If you use a CPAP at night, you may bring your mask for your  overnight stay. ?  ?Contacts, glasses, hearing aids, dentures or partials may not be worn into surgery, please bring cases for these belongings ?  ?For patients admitted to the hospital, discharge time will be determined by your treatment team. ?  ?Patients discharged the day of surgery will not be allowed to drive home, and someone needs to stay with them for 24 hours. ? ?NO VISITORS WILL BE ALLOWED IN PRE-OP WHERE PATIENTS ARE PREPPED FOR SURGERY.  ONLY 1 SUPPORT PERSON MAY BE PRESENT IN THE WAITING ROOM WHILE YOU ARE IN SURGERY.  IF YOU ARE TO BE ADMITTED, ONCE YOU ARE IN YOUR ROOM YOU WILL BE ALLOWED TWO (2) VISITORS. 1 (ONE) VISITOR MAY STAY OVERNIGHT BUT MUST ARRIVE TO THE ROOM BY 8pm.  Minor children may have two parents present. Special consideration for safety and communication needs will be reviewed on a case by case basis. ? ?Special instructions:   ? ?Oral Hygiene is also important to reduce your risk of infection.  Remember - BRUSH YOUR TEETH THE MORNING OF SURGERY WITH YOUR REGULAR TOOTHPASTE ? ? ?[facility]- Preparing For Surgery ? ?Before surgery, you can play an important role. Because skin is not sterile, your skin needs to be as free of germs as possible. You can reduce the number of germs on your skin by washing with CHG (chlorahexidine gluconate) Soap before surgery.  CHG is an antiseptic cleaner which kills germs and bonds with the skin to continue killing germs even after washing.   ? ? ?Please do not  use if you have an allergy to CHG or antibacterial soaps. If your skin becomes reddened/irritated stop using the CHG.  ?Do not shave (including legs and underarms) for at least 48 hours prior to first CHG shower. It is OK to shave your face. ? ?Please follow these instructions carefully. ?  ? ? Shower the NIGHT BEFORE SURGERY and the MORNING OF SURGERY with CHG Soap.  ? If you chose to wash your hair, wash your hair first as usual with your normal shampoo. After you shampoo, rinse your hair and  body thoroughly to remove the shampoo.  Then [employer] and genitals (private parts) with your normal soap and rinse thoroughly to remove soap. ? ?After that Use CHG Soap as you would any other liquid soap. You can apply CHG directly to the skin and wash gently with a scrungie or a clean washcloth.  ? ?Apply the CHG Soap to your body ONLY FROM THE NECK DOWN.  Do not use on open wounds or open sores. Avoid contact with your eyes, ears, mouth and genitals (private parts). Wash Face and genitals (private parts)  with your normal soap.  ? ?Wash thoroughly, paying special attention to the area where your surgery will be performed. ? ?Thoroughly rinse your body with warm water from the neck down. ? ?DO NOT shower/wash with your normal soap after using and rinsing off the CHG Soap. ? ?Pat yourself dry with a CLEAN TOWEL. ? ?Wear CLEAN PAJAMAS to bed the night before surgery ? ?Place CLEAN SHEETS on your bed the night before your surgery ? ?DO NOT SLEEP WITH PETS. ? ? ?Day of Surgery: ? ?Take a shower with CHG soap. ?Wear Clean/Comfortable clothing the morning of surgery ?Do not apply any deodorants/lotions.   ?Remember to brush your teeth WITH YOUR REGULAR TOOTHPASTE. ? ? ? ?COVID testing ? ?If you are going to stay overnight or be admitted after your procedure/surgery and require a pre-op COVID test, please follow these instructions after your COVID test  ? ?You are not required to quarantine however you are required to wear a well-fitting mask when you are out and around people not in your household.  If your mask becomes wet or soiled, replace with a new one. ? ?Wash your hands often with soap and water for 20 seconds or clean your hands with an alcohol-based hand sanitizer that contains at least 60% alcohol. ? ?Do not share personal items. ? ?Notify your provider: ?if you are in close contact with someone who has COVID  ?or if you develop a fever of 100.4 or greater, sneezing, cough, sore throat, shortness of breath or  body aches. ? ?  ?Please read over the following fact sheets that you were given.   ?

## 2021-03-31 ENCOUNTER — Encounter (HOSPITAL_COMMUNITY): Payer: Self-pay

## 2021-03-31 ENCOUNTER — Other Ambulatory Visit: Payer: Self-pay

## 2021-03-31 ENCOUNTER — Encounter (HOSPITAL_COMMUNITY)
Admission: RE | Admit: 2021-03-31 | Discharge: 2021-03-31 | Disposition: A | Discharge status: Home / Self Care | Payer: Medicare Other | Source: Ambulatory Visit | Attending: Neurological Surgery | Admitting: Neurological Surgery

## 2021-03-31 VITALS — BP 135/99 | HR 101 | Temp 98.6°F | Resp 18 | Ht 60.0 in | Wt 155.0 lb

## 2021-03-31 DIAGNOSIS — Z01812 Encounter for preprocedural laboratory examination: Secondary | ICD-10-CM | POA: Diagnosis not present

## 2021-03-31 DIAGNOSIS — I428 Other cardiomyopathies: Secondary | ICD-10-CM | POA: Diagnosis not present

## 2021-03-31 DIAGNOSIS — M48062 Spinal stenosis, lumbar region with neurogenic claudication: Secondary | ICD-10-CM | POA: Diagnosis not present

## 2021-03-31 DIAGNOSIS — E039 Hypothyroidism, unspecified: Secondary | ICD-10-CM | POA: Diagnosis not present

## 2021-03-31 DIAGNOSIS — I5021 Acute systolic (congestive) heart failure: Secondary | ICD-10-CM

## 2021-03-31 DIAGNOSIS — Z20822 Contact with and (suspected) exposure to covid-19: Secondary | ICD-10-CM | POA: Diagnosis not present

## 2021-03-31 DIAGNOSIS — Z01818 Encounter for other preprocedural examination: Secondary | ICD-10-CM

## 2021-03-31 DIAGNOSIS — I110 Hypertensive heart disease with heart failure: Secondary | ICD-10-CM | POA: Diagnosis not present

## 2021-03-31 HISTORY — DX: Unspecified adrenocortical insufficiency: E27.40

## 2021-03-31 LAB — BASIC METABOLIC PANEL
Anion gap: 9 (ref 5–15)
BUN: 9 mg/dL (ref 8–23)
CO2: 28 mmol/L (ref 22–32)
Calcium: 8.8 mg/dL — ABNORMAL LOW (ref 8.9–10.3)
Chloride: 102 mmol/L (ref 98–111)
Creatinine, Ser: 1.23 mg/dL (ref 0.61–1.24)
GFR, Estimated: >60 mL/min (ref >60)
Glucose, Bld: 129 mg/dL — ABNORMAL HIGH (ref 70–99)
Potassium: 3.5 mmol/L (ref 3.5–5.1)
Sodium: 139 mmol/L (ref 135–145)

## 2021-03-31 LAB — SURGICAL PCR SCREEN
MRSA, PCR: POSITIVE — AB
Staphylococcus aureus: POSITIVE — AB

## 2021-03-31 LAB — CBC
HCT: 55.7 % — ABNORMAL HIGH (ref 39.0–52.0)
Hemoglobin: 17.5 g/dL — ABNORMAL HIGH (ref 13.0–17.0)
MCH: 26.7 pg (ref 26.0–34.0)
MCHC: 31.4 g/dL (ref 30.0–36.0)
MCV: 84.9 fL (ref 80.0–100.0)
Platelets: 267 10*3/uL (ref 150–400)
RBC: 6.56 MIL/uL — ABNORMAL HIGH (ref 4.22–5.81)
RDW: 14 % (ref 11.5–15.5)
WBC: 7.9 10*3/uL (ref 4.0–10.5)
nRBC: 0 % (ref 0.0–0.2)

## 2021-03-31 LAB — SARS CORONAVIRUS 2 BY RT PCR (HOSPITAL ORDER, PERFORMED IN ~~LOC~~ HOSPITAL LAB): SARS Coronavirus 2: NEGATIVE

## 2021-03-31 NOTE — Progress Notes (Signed)
PCP - Norva Riffle, PA ?Cardiologist - Dr. Arvilla Meres ? ?PPM/ICD - denies ? ? ?Chest x-ray - 11/13/19 ?EKG - 12/20/20 ?Stress Test - denies ?ECHO - 04/26/20 ?Cardiac Cath - 11/16/19 ? ?Sleep Study - denies ? ? ?DM- denies ? ?Blood Thinner Instructions: n/a ?Aspirin Instructions: pt instructed to follow up with surgeon for ASA instructions ? ?ERAS Protcol - yes, no drink ? ? ?COVID TEST- 03/31/21 at PAT ? ? ?Anesthesia review: yes, cardiac hx ? ?Patient denies shortness of breath, fever, cough and chest pain at PAT appointment ? ? ?All instructions explained to the patient, with a verbal understanding of the material. Patient agrees to go over the instructions while at home for a better understanding. Patient also instructed to wear a mask in public after being tested for COVID-19. The opportunity to ask questions was provided. ?  ?

## 2021-04-03 ENCOUNTER — Encounter (HOSPITAL_COMMUNITY): Payer: Self-pay

## 2021-04-03 NOTE — Anesthesia Preprocedure Evaluation (Addendum)
Anesthesia Evaluation  ?Patient identified by MRN, date of birth, ID band ?Patient awake ? ? ? ?Reviewed: ?Allergy & Precautions, H&P , NPO status , Patient's Chart, lab work & pertinent test results ? ?Airway ?Mallampati: II ? ? ?Neck ROM: full ? ? ? Dental ?  ?Pulmonary ?neg pulmonary ROS,  ?  ?breath sounds clear to auscultation ? ? ? ? ? ? Cardiovascular ?hypertension, +CHF  ? ?Rhythm:regular Rate:Normal ? ?TTE (03/2020): EF 45-50% ?  ?Neuro/Psych ?  ? GI/Hepatic ?  ?Endo/Other  ?Hypothyroidism Adrenal insufficiency ? Renal/GU ?  ? ?  ?Musculoskeletal ? ? Abdominal ?  ?Peds ? Hematology ?  ?Anesthesia Other Findings ? ? Reproductive/Obstetrics ? ?  ? ? ? ? ? ? ? ? ? ? ? ? ? ?  ?  ? ? ? ? ? ? ?Anesthesia Physical ?Anesthesia Plan ? ?ASA: 3 ? ?Anesthesia Plan: General  ? ?Post-op Pain Management:   ? ?Induction: Intravenous ? ?PONV Risk Score and Plan: 2 and Ondansetron, Dexamethasone, Midazolam and Treatment may vary due to age or medical condition ? ?Airway Management Planned: Oral ETT ? ?Additional Equipment:  ? ?Intra-op Plan:  ? ?Post-operative Plan: Extubation in OR ? ?Informed Consent: I have reviewed the patients History and Physical, chart, labs and discussed the procedure including the risks, benefits and alternatives for the proposed anesthesia with the patient or authorized representative who has indicated his/her understanding and acceptance.  ? ? ? ?Dental advisory given ? ?Plan Discussed with: CRNA, Anesthesiologist and Surgeon ? ?Anesthesia Plan Comments: (See PAT note written 04/03/2021 by Shonna Chock, PA-C. ? ?Cardiac MRI 10/14/20: ?1. Mild LVE diffuse hypokinesis EF 44% improved from 11/17/19 ?measured at 37% then ?2. Persistent mid myocardial gadolinium enhancement more impressive ?in antero and inferolateral walls compared to previous study ?including some mottled uptake in the septum and apex ?3. ?Small lateral pericardial effusion ?4. Abnormal parametrics  including elevated T1 mildly elevated T2 in ?regions with gadolinium uptake and elevated ECG Given patients age ?and like of coronary artery disease? Sarcoid vs infiltrative ?cardiomyopathy. Gadolinium uptake more impressive than MRI done ?10/1919 and ECG higher ?5. ?Normal RV size and function ? ?Echo 04/26/20: ??1. Left ventricular ejection fraction, by estimation, is 45 to 50%. The  ?left ventricle has mildly decreased function. The left ventricle  ?demonstrates global hypokinesis. Indeterminate diastolic filling due to  ?E-A fusion.  ??2. Right ventricular systolic function is normal. The right ventricular  ?size is normal. Tricuspid regurgitation signal is inadequate for assessing  ?PA pressure.  ??3. Left atrial size was moderately dilated.  ??4. The mitral valve is normal in structure. No evidence of mitral valve  ?regurgitation. No evidence of mitral stenosis.  ??5. The aortic valve is normal in structure. Aortic valve regurgitation is  ?not visualized. No aortic stenosis is present.  ??6. The inferior vena cava is normal in size with greater than 50%  ?respiratory variability, suggesting right atrial pressure of 3 mmHg.  ?- Comparison(s): The left ventricular function has improved. The right  ?ventricular hypertrophy has improved. Left ventricular chamber appears  ?less dilated.  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  ? ? ?NM Cardiac Amyloid study 11/17/19: ?IMPRESSION: ?Visual and quantitative assessment (grade 1 and H/CL = 1.18) are ?equivocal for transthyretin amyloidosis. ? ? ?RHC/LHC 11/16/19: ?1. Severe NICM EF 20% ?2. Normal coronaries ?3.  Mildly elevated filling pressures with normal CO ?Plan/Discussion: Medical therapy. Await results of cMRI. )  ? ? ? ? ? ?Anesthesia Quick Evaluation ? ?

## 2021-04-03 NOTE — Progress Notes (Signed)
Anesthesia Chart Review:  Case: [id number] Date/Time: 04/04/21 1233   Procedure: L3-4 MIS Laminectomy, discectomy - 3C/RM 20   Anesthesia type: General   Pre-op diagnosis: Lumbar stenosis with neurogenic claudication   [facility] ROOM 20 / [facility] OR   Surgeons: Jadene Pierini, MD       DISCUSSION: Patient is a 66 year old male scheduled for the above procedure.  History includes never smoker, HTN, non-ischemic cardiomyopathy (diagnosed 10/2019, normal coronaries with LVEF 20% 11/16/19; LVEF 44% 10/14/20 cMRI), HFrEF, adrenal insufficiency (10/2019), hypothyroidism, hyponatremia (admitted 08/2019 with hypervolemic hyponatremia, weight loss/protein malnutrition).  He emigrated from [place] to the [place] is 1998. Moved to [facility] from [place], [place] ~ 2020.  Per PAT RN notation, he denied any language barriers but was "delayed with responses" so son Laney Potash [phone]) was available in PAT and plans to be in the waiting room on day of surgery if any assistance is needed.   He was diagnosed with severe non-ischemic cardiomyopathy in 10/2019. Also diagnosed with adrenal insufficieny. He is followed by Dr. Gala Romney at [facility] and by endocrinologist Dr. Lonzo Cloud. He is on levothyroxine and hydrocortisone. No abnormalities of the hypothalamus or pituitary gland on 11/16/19 MRI. HF medications include Farxiga, Bidil, Entresto, spironolactone, torsemide. He is also on Crestor.   Per 12/20/20 office visit by Dr. Gala Romney, "Etiology of HF remains unclear. ? secondary adrenocortical insufficiency as etiology of HF. Serum cortisol level was markedly low at 0.4 (10/21)>>now on hydrocortisone 10 mg qam/ 5 mg pm. Brain MRI unrevealing. cMRI concerning for possible previous myocarditis vs sarcoid. Can consider PET but sarcoid would be treated by hydrocortisone so not sure it would change management. No evidence of pulmonary sarcoid on CT 10/21". He as marked first degree AV block so, b-blocker therapy has  been avoided. Six month follow-up recommended.   He is current with HF cardiology and endocrinology follow-up--visits in 11/2020. He remains on Cortef for adrenal insufficiency. HF medications as listed. LVEF up to 44% 09/2020. Normal coronaries 10/2019. He denied SOB, chest pain, cough, fever at PAT RN visit.   Anesthesia team to evaluate on the day of surgery and assess for any acute changes. Discussed with anesthesiologist Leslye Peer, MD. 03/31/2021 presurgical COVID-19 test negative.   VS: BP (!) 135/99    Pulse (!) 101    Temp 37 C (Oral)    Resp 18    Ht 5' (1.524 m)    Wt 70.3 kg    SpO2 96%    BMI 30.27 kg/m    PROVIDERS: Norm Salt, PA is PCP  - Arvilla Meres, MD is HF cardiologist. Last visit 12/20/20. Also seen by primary cardiologist Laurance Flatten, MD but is now primarily followed at the HF Clinic.  - [facility], Brent Bulla, MD is endocrinologist. Last visit 12/14/20 with one year follow-up recommended.    LABS: Preoperative labs reviewed. Ca 8.8. H/H 17.5/55.7, previously 16.9/52.2 on 08/17/20.  (all labs ordered are listed, but only abnormal results are displayed)  Labs Reviewed  SURGICAL PCR SCREEN - Abnormal; Notable for the following components:      Result Value   MRSA, PCR POSITIVE (*)    Staphylococcus aureus POSITIVE (*)    All other components within normal limits  BASIC METABOLIC PANEL - Abnormal; Notable for the following components:   Glucose, Bld 129 (*)    Calcium 8.8 (*)    All other components within normal limits  CBC - Abnormal; Notable for the following components:  RBC 6.56 (*)    Hemoglobin 17.5 (*)    HCT 55.7 (*)    All other components within normal limits  SARS CORONAVIRUS 2 BY RT PCR (HOSPITAL ORDER, PERFORMED IN [facility])    IMAGES: MRI Brain 11/16/19: IMPRESSION: 1. No acute or reversible finding. No evidence of metastatic disease. 2. Moderate chronic small-vessel ischemic changes of the  cerebral hemispheric deep white matter. 3. Considerable degenerative changes of the upper cervical spine. Some degree of spinal stenosis at C2-3, not primarily or completely evaluated.   EKG:12/20/20 ([facility]): Sinus rhythm with 1st degree A-V block with Premature atrial complexes Rightward axis Marked ST abnormality, possible inferolateral subendocardial injury Prolonged QT [QT 454/QTc 533 ms] Abnormal ECG Since last tracing Premature atrial complexes are now Present Confirmed by End, Christopher [phone]) on 12/20/2020 11:00:36 PM - Prolonged QT and marked inferolateral ST abnormality also noted on 08/17/20 EKG.   CV: Cardiac MRI 10/14/20: IMPRESSION: 1. Mild LVE diffuse hypokinesis EF 44% improved from 11/17/19 measured at 37% then 2. Persistent mid myocardial gadolinium enhancement more impressive in antero and inferolateral walls compared to previous study including some mottled uptake in the septum and apex 3.  Small lateral pericardial effusion 4. Abnormal parametrics including elevated T1 mildly elevated T2 in regions with gadolinium uptake and elevated ECG Given patients age and like of coronary artery disease? Sarcoid vs infiltrative cardiomyopathy. Gadolinium uptake more impressive than MRI done 10/1919 and ECG higher 5.  Normal RV size and function - Comparison cMRI 11/16/19: bilateral moderate pleural effusions, normal LV size with EF 37% and diffuse LV hypokinesis, moderately dilated RV with EF 34 %, biatrial enlargement, non-coronary pattern LGE, LGE is extensive and mid-wll, suggesting possible piror myocarditis versus infiltrative disease, lack of LVH and ECV % < 40$ makes cardiac amyloidosis less likely.   Echo 04/26/20: IMPRESSIONS   1. Left ventricular ejection fraction, by estimation, is 45 to 50%. The  left ventricle has mildly decreased function. The left ventricle  demonstrates global hypokinesis. Indeterminate diastolic filling due to  E-A fusion.    2. Right ventricular systolic function is normal. The right ventricular  size is normal. Tricuspid regurgitation signal is inadequate for assessing  PA pressure.   3. Left atrial size was moderately dilated.   4. The mitral valve is normal in structure. No evidence of mitral valve  regurgitation. No evidence of mitral stenosis.   5. The aortic valve is normal in structure. Aortic valve regurgitation is  not visualized. No aortic stenosis is present.   6. The inferior vena cava is normal in size with greater than 50%  respiratory variability, suggesting right atrial pressure of 3 mmHg.  - Comparison(s): The left ventricular function has improved. The right  ventricular hypertrophy has improved. Left ventricular chamber appears  less dilated.  - Conclusion(s)/Recommendation(s): There is a very long first degree AV  block (with fusion of early and laste diastolic velocities) and  intermittent second degree AV block, that impredes diastolic function  assessment.    NM Cardiac Amyloid study 11/17/19: IMPRESSION: Visual and quantitative assessment (grade 1 and H/CL = 1.18) are equivocal for transthyretin amyloidosis.   RHC/LHC 11/16/19: Findings: Ao = 123/72 (94) LV = 122/15 RA = 9 RV = 48/12 PA = 49/17 (31) PCW = 22 (v = 31) Fick cardiac output/index = 4.1/2.4 PVR = 2.2 WU  FA sat = 99% PA sat = 68%, 69% PAPi = 3.5   Assessment: 1. Severe NICM EF 20% 2. Normal  coronaries 3. Mildly elevated filling pressures with normal CO   Plan/Discussion: Medical therapy. Await results of cMRI.    Past Medical History:  Diagnosis Date   CHF (congestive heart failure) (HCC)    Heart failure (HCC)    Hypertension    Hyponatremia    Hypothyroidism    Shoulder pain     Past Surgical History:  Procedure Laterality Date   HERNIA REPAIR Right 2005   inguinal   RIGHT/LEFT HEART CATH AND CORONARY ANGIOGRAPHY N/A 11/16/2019   Procedure: RIGHT/LEFT HEART CATH AND CORONARY  ANGIOGRAPHY;  Surgeon: Dolores Patty, MD;  Location: [facility] INVAS[facility] CV LAB;  Service: Cardiovascular;  Laterality: N/A;    MEDICATIONS:  aspirin EC 81 MG EC tablet   dapagliflozin propanediol (FARXIGA) 10 MG TABS tablet   Diclofenac Sodium 3 % GEL   folic acid (FOLVITE) 1 MG tablet   hydrocortisone (CORTEF) 10 MG tablet   hydrocortisone (CORTEF) 5 MG tablet   isosorbide-hydrALAZINE (BIDIL) 20-37.5 MG tablet   levothyroxine (SYNTHROID) 50 MCG tablet   MULTIPLE VITAMIN PO   rosuvastatin (CRESTOR) 10 MG tablet   sacubitril-valsartan (ENTRESTO) 97-103 MG   spironolactone (ALDACTONE) 25 MG tablet   torsemide (DEMADEX) 20 MG tablet   No current facility-administered medications for this encounter.  PAT RN: Advised to clarify ASA instructions with surgeon if not already given.   Shonna Chock, PA-C Surgical Short Stay/Anesthesiology [facility] Phone [phone] [facility] Phone [phone] 04/03/2021 12:23 PM

## 2021-04-04 ENCOUNTER — Observation Stay (HOSPITAL_COMMUNITY)
Admission: RE | Admit: 2021-04-04 | Discharge: 2021-04-05 | Disposition: A | Discharge status: Home Health | Payer: Medicare Other | Attending: Neurological Surgery | Admitting: Neurological Surgery

## 2021-04-04 ENCOUNTER — Ambulatory Visit (HOSPITAL_BASED_OUTPATIENT_CLINIC_OR_DEPARTMENT_OTHER): Payer: Medicare Other | Admitting: Anesthesiology

## 2021-04-04 ENCOUNTER — Other Ambulatory Visit: Payer: Self-pay

## 2021-04-04 ENCOUNTER — Ambulatory Visit (HOSPITAL_COMMUNITY): Payer: Medicare Other | Admitting: Vascular Surgery

## 2021-04-04 ENCOUNTER — Other Ambulatory Visit: Payer: Self-pay | Admitting: Internal Medicine

## 2021-04-04 ENCOUNTER — Ambulatory Visit (HOSPITAL_COMMUNITY): Payer: Medicare Other

## 2021-04-04 ENCOUNTER — Encounter (HOSPITAL_COMMUNITY): Payer: Self-pay | Admitting: Neurological Surgery

## 2021-04-04 ENCOUNTER — Encounter (HOSPITAL_COMMUNITY)
Admission: RE | Disposition: A | Discharge status: Home Health | Payer: Self-pay | Source: Home / Self Care | Attending: Neurological Surgery

## 2021-04-04 DIAGNOSIS — Z419 Encounter for procedure for purposes other than remedying health state, unspecified: Secondary | ICD-10-CM

## 2021-04-04 DIAGNOSIS — I509 Heart failure, unspecified: Secondary | ICD-10-CM

## 2021-04-04 DIAGNOSIS — M48062 Spinal stenosis, lumbar region with neurogenic claudication: Secondary | ICD-10-CM

## 2021-04-04 DIAGNOSIS — Z79899 Other long term (current) drug therapy: Secondary | ICD-10-CM | POA: Insufficient documentation

## 2021-04-04 DIAGNOSIS — E039 Hypothyroidism, unspecified: Secondary | ICD-10-CM

## 2021-04-04 DIAGNOSIS — I110 Hypertensive heart disease with heart failure: Secondary | ICD-10-CM

## 2021-04-04 HISTORY — PX: LUMBAR LAMINECTOMY/ DECOMPRESSION WITH MET-RX: SHX5959

## 2021-04-04 SURGERY — LUMBAR LAMINECTOMY/ DECOMPRESSION WITH MET-RX
Anesthesia: General

## 2021-04-04 MED ORDER — EPHEDRINE SULFATE-NACL 50-0.9 MG/10ML-% IV SOSY
PREFILLED_SYRINGE | INTRAVENOUS | Status: DC | PRN
  Administered 2021-04-04 (×2): 10 mg via INTRAVENOUS

## 2021-04-04 MED ORDER — HYDROCORTISONE SOD SUC (PF) 100 MG IJ SOLR
50.0000 mg | Freq: Three times a day (TID) | INTRAMUSCULAR | Status: AC
  Administered 2021-04-04 – 2021-04-05 (×2): 50 mg via INTRAVENOUS
  Filled 2021-04-04 (×2): qty 1

## 2021-04-04 MED ORDER — DOCUSATE SODIUM 100 MG PO CAPS
100.0000 mg | ORAL_CAPSULE | Freq: Two times a day (BID) | ORAL | Status: DC
  Administered 2021-04-04 – 2021-04-05 (×2): 100 mg via ORAL
  Filled 2021-04-04 (×2): qty 1

## 2021-04-04 MED ORDER — SUGAMMADEX SODIUM 200 MG/2ML IV SOLN
INTRAVENOUS | Status: DC | PRN
  Administered 2021-04-04: 200 mg via INTRAVENOUS

## 2021-04-04 MED ORDER — DEXAMETHASONE SODIUM PHOSPHATE 10 MG/ML IJ SOLN: INTRAMUSCULAR | Status: DC | PRN

## 2021-04-04 MED ORDER — HYDROCORTISONE 5 MG PO TABS
5.0000 mg | ORAL_TABLET | Freq: Every evening | ORAL | Status: DC
  Administered 2021-04-04: 5 mg via ORAL
  Filled 2021-04-04 (×2): qty 1

## 2021-04-04 MED ORDER — PHENOL 1.4 % MT LIQD: 1.0000 | OROMUCOSAL | Status: DC | PRN

## 2021-04-04 MED ORDER — LIDOCAINE 2% (20 MG/ML) 5 ML SYRINGE
INTRAMUSCULAR | Status: DC | PRN
  Administered 2021-04-04: 50 mg via INTRAVENOUS

## 2021-04-04 MED ORDER — TORSEMIDE 20 MG PO TABS
20.0000 mg | ORAL_TABLET | Freq: Every day | ORAL | Status: DC
  Administered 2021-04-05: 20 mg via ORAL
  Filled 2021-04-04 (×2): qty 1

## 2021-04-04 MED ORDER — DAPAGLIFLOZIN PROPANEDIOL 10 MG PO TABS
10.0000 mg | ORAL_TABLET | Freq: Every day | ORAL | Status: DC
  Administered 2021-04-04 – 2021-04-05 (×2): 10 mg via ORAL
  Filled 2021-04-04 (×2): qty 1

## 2021-04-04 MED ORDER — CHLORHEXIDINE GLUCONATE CLOTH 2 % EX PADS: 6.0000 | MEDICATED_PAD | Freq: Once | CUTANEOUS | Status: DC

## 2021-04-04 MED ORDER — FENTANYL CITRATE (PF) 250 MCG/5ML IJ SOLN
INTRAMUSCULAR | Status: DC | PRN
  Administered 2021-04-04: 100 ug via INTRAVENOUS
  Administered 2021-04-04 (×2): 50 ug via INTRAVENOUS

## 2021-04-04 MED ORDER — OXYCODONE HCL 5 MG PO TABS
5.0000 mg | ORAL_TABLET | ORAL | Status: DC | PRN
  Administered 2021-04-04: 5 mg via ORAL
  Filled 2021-04-04: qty 1

## 2021-04-04 MED ORDER — 0.9 % SODIUM CHLORIDE (POUR BTL) OPTIME
TOPICAL | Status: DC | PRN
  Administered 2021-04-04: 1000 mL

## 2021-04-04 MED ORDER — OXYCODONE HCL 5 MG PO TABS
10.0000 mg | ORAL_TABLET | ORAL | Status: DC | PRN
  Administered 2021-04-05 (×4): 10 mg via ORAL
  Filled 2021-04-04 (×4): qty 2

## 2021-04-04 MED ORDER — CYCLOBENZAPRINE HCL 10 MG PO TABS
10.0000 mg | ORAL_TABLET | Freq: Three times a day (TID) | ORAL | Status: DC | PRN
  Administered 2021-04-04 – 2021-04-05 (×2): 10 mg via ORAL
  Filled 2021-04-04 (×2): qty 1

## 2021-04-04 MED ORDER — SODIUM CHLORIDE 0.9% FLUSH: 3.0000 mL | INTRAVENOUS | Status: DC | PRN

## 2021-04-04 MED ORDER — OXYCODONE HCL 5 MG/5ML PO SOLN: 5.0000 mg | Freq: Once | ORAL | Status: DC | PRN

## 2021-04-04 MED ORDER — FOLIC ACID 1 MG PO TABS
1.0000 mg | ORAL_TABLET | Freq: Every day | ORAL | Status: DC
  Administered 2021-04-04 – 2021-04-05 (×2): 1 mg via ORAL
  Filled 2021-04-04 (×2): qty 1

## 2021-04-04 MED ORDER — PROPOFOL 10 MG/ML IV BOLUS
INTRAVENOUS | Status: DC | PRN
Start: 2021-04-04 — End: 2021-04-04
  Administered 2021-04-04: 130 mg via INTRAVENOUS
  Administered 2021-04-04 (×2): 20 mg via INTRAVENOUS

## 2021-04-04 MED ORDER — SACUBITRIL-VALSARTAN 97-103 MG PO TABS
1.0000 | ORAL_TABLET | Freq: Two times a day (BID) | ORAL | Status: DC
  Administered 2021-04-04 – 2021-04-05 (×2): 1 via ORAL
  Filled 2021-04-04 (×4): qty 1

## 2021-04-04 MED ORDER — CEFAZOLIN SODIUM-DEXTROSE 2-4 GM/100ML-% IV SOLN
2.0000 g | INTRAVENOUS | Status: DC
  Filled 2021-04-04: qty 100

## 2021-04-04 MED ORDER — GELATIN ABSORBABLE MT POWD
OROMUCOSAL | Status: DC | PRN
  Administered 2021-04-04: 5 mL via TOPICAL

## 2021-04-04 MED ORDER — ONDANSETRON HCL 4 MG PO TABS: 4.0000 mg | ORAL_TABLET | Freq: Four times a day (QID) | ORAL | Status: DC | PRN

## 2021-04-04 MED ORDER — PHENYLEPHRINE 40 MCG/ML (10ML) SYRINGE FOR IV PUSH (FOR BLOOD PRESSURE SUPPORT)
PREFILLED_SYRINGE | INTRAVENOUS | Status: DC | PRN
  Administered 2021-04-04 (×3): 120 ug via INTRAVENOUS

## 2021-04-04 MED ORDER — PROPOFOL 10 MG/ML IV BOLUS
INTRAVENOUS | Status: AC
  Filled 2021-04-04: qty 20

## 2021-04-04 MED ORDER — LIDOCAINE-EPINEPHRINE 1 %-1:100000 IJ SOLN
INTRAMUSCULAR | Status: DC | PRN
Start: 2021-04-04 — End: 2021-04-04
  Administered 2021-04-04: 10 mL

## 2021-04-04 MED ORDER — ADULT MULTIVITAMIN W/MINERALS CH
1.0000 | ORAL_TABLET | Freq: Every day | ORAL | Status: DC
  Administered 2021-04-05: 1 via ORAL
  Filled 2021-04-04: qty 1

## 2021-04-04 MED ORDER — MIDAZOLAM HCL 2 MG/2ML IJ SOLN
INTRAMUSCULAR | Status: DC | PRN
  Administered 2021-04-04: 2 mg via INTRAVENOUS

## 2021-04-04 MED ORDER — MULTIPLE VITAMIN PO TABS
ORAL_TABLET | Freq: Every day | ORAL | Status: DC
Start: 2021-04-04 — End: 2021-04-04

## 2021-04-04 MED ORDER — ONDANSETRON HCL 4 MG/2ML IJ SOLN: 4.0000 mg | Freq: Four times a day (QID) | INTRAMUSCULAR | Status: DC | PRN

## 2021-04-04 MED ORDER — ONDANSETRON HCL 4 MG/2ML IJ SOLN
INTRAMUSCULAR | Status: AC
  Filled 2021-04-04: qty 2

## 2021-04-04 MED ORDER — MIDAZOLAM HCL 2 MG/2ML IJ SOLN
INTRAMUSCULAR | Status: AC
  Filled 2021-04-04: qty 2

## 2021-04-04 MED ORDER — PHENYLEPHRINE HCL-NACL 20-0.9 MG/250ML-% IV SOLN
INTRAVENOUS | Status: DC | PRN
  Administered 2021-04-04: 50 ug/min via INTRAVENOUS

## 2021-04-04 MED ORDER — CEFAZOLIN SODIUM-DEXTROSE 2-4 GM/100ML-% IV SOLN
2.0000 g | Freq: Three times a day (TID) | INTRAVENOUS | Status: AC
  Administered 2021-04-04 – 2021-04-05 (×2): 2 g via INTRAVENOUS
  Filled 2021-04-04 (×2): qty 100

## 2021-04-04 MED ORDER — HYDROCORTISONE 10 MG PO TABS
10.0000 mg | ORAL_TABLET | Freq: Every day | ORAL | Status: DC
  Administered 2021-04-05: 10 mg via ORAL
  Filled 2021-04-04: qty 1

## 2021-04-04 MED ORDER — LIDOCAINE 2% (20 MG/ML) 5 ML SYRINGE
INTRAMUSCULAR | Status: AC
  Filled 2021-04-04: qty 5

## 2021-04-04 MED ORDER — ACETAMINOPHEN 650 MG RE SUPP: 650.0000 mg | RECTAL | Status: DC | PRN

## 2021-04-04 MED ORDER — SODIUM CHLORIDE 0.9 % IV SOLN
250.0000 mL | INTRAVENOUS | Status: DC
  Administered 2021-04-04: 250 mL via INTRAVENOUS

## 2021-04-04 MED ORDER — MENTHOL 3 MG MT LOZG: 1.0000 | LOZENGE | OROMUCOSAL | Status: DC | PRN

## 2021-04-04 MED ORDER — OXYCODONE HCL 5 MG PO TABS: 5.0000 mg | ORAL_TABLET | Freq: Once | ORAL | Status: DC | PRN

## 2021-04-04 MED ORDER — ACETAMINOPHEN 325 MG PO TABS
650.0000 mg | ORAL_TABLET | ORAL | Status: DC | PRN
  Administered 2021-04-05: 650 mg via ORAL
  Filled 2021-04-04 (×3): qty 2

## 2021-04-04 MED ORDER — VANCOMYCIN HCL IN DEXTROSE 1-5 GM/200ML-% IV SOLN
1000.0000 mg | Freq: Once | INTRAVENOUS | Status: AC
Start: 2021-04-04 — End: 2021-04-05
  Administered 2021-04-04: 1000 mg via INTRAVENOUS
  Filled 2021-04-04: qty 200

## 2021-04-04 MED ORDER — LEVOTHYROXINE SODIUM 50 MCG PO TABS
50.0000 ug | ORAL_TABLET | Freq: Every day | ORAL | Status: DC
  Administered 2021-04-05: 50 ug via ORAL
  Filled 2021-04-04: qty 1

## 2021-04-04 MED ORDER — POLYETHYLENE GLYCOL 3350 17 G PO PACK: 17.0000 g | PACK | Freq: Every day | ORAL | Status: DC | PRN

## 2021-04-04 MED ORDER — ROCURONIUM BROMIDE 10 MG/ML (PF) SYRINGE
PREFILLED_SYRINGE | INTRAVENOUS | Status: AC
  Filled 2021-04-04: qty 10

## 2021-04-04 MED ORDER — HYDROCORTISONE SOD SUC (PF) 1000 MG IJ SOLR
INTRAMUSCULAR | Status: DC | PRN
  Administered 2021-04-04: 250 mg via INTRAVENOUS

## 2021-04-04 MED ORDER — ORAL CARE MOUTH RINSE
15.0000 mL | Freq: Once | OROMUCOSAL | Status: AC
Start: 2021-04-04 — End: 2021-04-04

## 2021-04-04 MED ORDER — ROSUVASTATIN CALCIUM 5 MG PO TABS
10.0000 mg | ORAL_TABLET | Freq: Every day | ORAL | Status: DC
  Administered 2021-04-04 – 2021-04-05 (×2): 10 mg via ORAL
  Filled 2021-04-04 (×2): qty 2

## 2021-04-04 MED ORDER — FENTANYL CITRATE (PF) 100 MCG/2ML IJ SOLN: 25.0000 ug | INTRAMUSCULAR | Status: DC | PRN

## 2021-04-04 MED ORDER — LIDOCAINE-EPINEPHRINE 1 %-1:100000 IJ SOLN
INTRAMUSCULAR | Status: AC
  Filled 2021-04-04: qty 1

## 2021-04-04 MED ORDER — SPIRONOLACTONE 25 MG PO TABS
25.0000 mg | ORAL_TABLET | Freq: Every day | ORAL | Status: DC
  Administered 2021-04-04: 25 mg via ORAL
  Filled 2021-04-04: qty 1

## 2021-04-04 MED ORDER — HYDROMORPHONE HCL 1 MG/ML IJ SOLN
1.0000 mg | INTRAMUSCULAR | Status: DC | PRN
  Administered 2021-04-04: 1 mg via INTRAVENOUS
  Filled 2021-04-04: qty 1

## 2021-04-04 MED ORDER — CHLORHEXIDINE GLUCONATE 0.12 % MT SOLN
15.0000 mL | Freq: Once | OROMUCOSAL | Status: AC
  Administered 2021-04-04: 15 mL via OROMUCOSAL
  Filled 2021-04-04: qty 15

## 2021-04-04 MED ORDER — ROCURONIUM BROMIDE 10 MG/ML (PF) SYRINGE
PREFILLED_SYRINGE | INTRAVENOUS | Status: DC | PRN
  Administered 2021-04-04: 50 mg via INTRAVENOUS

## 2021-04-04 MED ORDER — ONDANSETRON HCL 4 MG/2ML IJ SOLN
INTRAMUSCULAR | Status: DC | PRN
  Administered 2021-04-04: 4 mg via INTRAVENOUS

## 2021-04-04 MED ORDER — SODIUM CHLORIDE 0.9% FLUSH: 3.0000 mL | Freq: Two times a day (BID) | INTRAVENOUS | Status: DC

## 2021-04-04 MED ORDER — HYDROCORTISONE 10 MG PO TABS: 10.0000 mg | ORAL_TABLET | Freq: Every day | ORAL | 3 refills | Status: DC

## 2021-04-04 MED ORDER — FENTANYL CITRATE (PF) 250 MCG/5ML IJ SOLN
INTRAMUSCULAR | Status: AC
  Filled 2021-04-04: qty 5

## 2021-04-04 MED ORDER — THROMBIN 5000 UNITS EX SOLR
CUTANEOUS | Status: AC
  Filled 2021-04-04: qty 5000

## 2021-04-04 MED ORDER — HYDROCORTISONE SOD SUC (PF) 250 MG IJ SOLR
INTRAMUSCULAR | Status: AC
  Filled 2021-04-04: qty 250

## 2021-04-04 MED ORDER — ISOSORB DINITRATE-HYDRALAZINE 20-37.5 MG PO TABS
2.0000 | ORAL_TABLET | Freq: Three times a day (TID) | ORAL | Status: DC
  Administered 2021-04-05: 2 via ORAL
  Filled 2021-04-04 (×5): qty 2

## 2021-04-04 MED ORDER — LACTATED RINGERS IV SOLN: INTRAVENOUS | Status: DC

## 2021-04-04 SURGICAL SUPPLY — 49 items
BAG COUNTER SPONGE SURGICOUNT (BAG) ×3 IMPLANT
BAND RUBBER #18 3X1/16 STRL (MISCELLANEOUS) ×4 IMPLANT
BLADE CLIPPER SURG (BLADE) IMPLANT
BLADE SURG 11 STRL SS (BLADE) ×2 IMPLANT
BUR MATCHSTICK NEURO 3.0 LAGG (BURR) ×2 IMPLANT
BUR PRECISION FLUTE 5.0 (BURR) ×1 IMPLANT
CANISTER SUCT 3000ML PPV (MISCELLANEOUS) ×2 IMPLANT
DECANTER SPIKE VIAL GLASS SM (MISCELLANEOUS) ×2 IMPLANT
DERMABOND ADVANCED (GAUZE/BANDAGES/DRESSINGS) ×1
DERMABOND ADVANCED .7 DNX12 (GAUZE/BANDAGES/DRESSINGS) ×1 IMPLANT
DRAPE C-ARM 42X72 X-RAY (DRAPES) ×4 IMPLANT
DRAPE LAPAROTOMY 100X72X124 (DRAPES) ×2 IMPLANT
DRAPE MICROSCOPE LEICA (MISCELLANEOUS) ×2 IMPLANT
DRAPE SURG 17X23 STRL (DRAPES) ×1 IMPLANT
DURAPREP 26ML APPLICATOR (WOUND CARE) ×2 IMPLANT
ELECT BLADE 6.5 EXT (BLADE) ×2 IMPLANT
ELECT REM PT RETURN 9FT ADLT (ELECTROSURGICAL) ×2
ELECTRODE REM PT RTRN 9FT ADLT (ELECTROSURGICAL) ×1 IMPLANT
GAUZE 4X4 16PLY ~~LOC~~+RFID DBL (SPONGE) ×1 IMPLANT
GAUZE SPONGE 4X4 12PLY STRL (GAUZE/BANDAGES/DRESSINGS) IMPLANT
GLOVE EXAM NITRILE LRG STRL (GLOVE) IMPLANT
GLOVE EXAM NITRILE XL STR (GLOVE) IMPLANT
GLOVE EXAM NITRILE XS STR PU (GLOVE) IMPLANT
GLOVE SURG LTX SZ7.5 (GLOVE) ×2 IMPLANT
GLOVE SURG POLYISO LF SZ6.5 (GLOVE) ×1 IMPLANT
GLOVE SURG UNDER POLY LF SZ6.5 (GLOVE) ×1 IMPLANT
GLOVE SURG UNDER POLY LF SZ7.5 (GLOVE) ×3 IMPLANT
GOWN STRL REUS W/ TWL LRG LVL3 (GOWN DISPOSABLE) ×2 IMPLANT
GOWN STRL REUS W/ TWL XL LVL3 (GOWN DISPOSABLE) IMPLANT
GOWN STRL REUS W/TWL 2XL LVL3 (GOWN DISPOSABLE) IMPLANT
GOWN STRL REUS W/TWL LRG LVL3 (GOWN DISPOSABLE) ×2
GOWN STRL REUS W/TWL XL LVL3 (GOWN DISPOSABLE) ×1
HEMOSTAT POWDER KIT SURGIFOAM (HEMOSTASIS) ×2 IMPLANT
KIT BASIN OR (CUSTOM PROCEDURE TRAY) ×2 IMPLANT
KIT TURNOVER KIT B (KITS) ×2 IMPLANT
NDL SPNL 18GX3.5 QUINCKE PK (NEEDLE) ×1 IMPLANT
NEEDLE HYPO 22GX1.5 SAFETY (NEEDLE) ×2 IMPLANT
NEEDLE SPNL 18GX3.5 QUINCKE PK (NEEDLE) ×2 IMPLANT
NS IRRIG 1000ML POUR BTL (IV SOLUTION) ×2 IMPLANT
PACK LAMINECTOMY NEURO (CUSTOM PROCEDURE TRAY) ×2 IMPLANT
PAD ARMBOARD 7.5X6 YLW CONV (MISCELLANEOUS) ×3 IMPLANT
SPONGE T-LAP 4X18 ~~LOC~~+RFID (SPONGE) ×1 IMPLANT
SUT MNCRL AB 3-0 PS2 18 (SUTURE) ×1 IMPLANT
SUT VIC AB 0 CT1 18XCR BRD8 (SUTURE) IMPLANT
SUT VIC AB 0 CT1 8-18 (SUTURE)
SUT VIC AB 2-0 CP2 18 (SUTURE) ×2 IMPLANT
TOWEL GREEN STERILE (TOWEL DISPOSABLE) ×2 IMPLANT
TOWEL GREEN STERILE FF (TOWEL DISPOSABLE) ×2 IMPLANT
WATER STERILE IRR 1000ML POUR (IV SOLUTION) ×2 IMPLANT

## 2021-04-04 NOTE — H&P (Signed)
Surgical H&P Update ? ?HPI: 66 y.o. with a history of neurogenic claudication. Workup showed multi-level lumbar stenosis, thought to have symptoms due to his stenosis at L3-4. No changes in health since they were last seen. Still having the above and wishes to proceed with surgery. Of note, does have a h/o adrenal insufficiency requiring replacement.  ? ?PMHx:  ?Past Medical History:  ?Diagnosis Date  ? Adrenal insufficiency (HCC)   ? CHF (congestive heart failure) (HCC)   ? Heart failure (HCC)   ? Hypertension   ? Hyponatremia   ? Hypothyroidism   ? Shoulder pain   ? ?FamHx: History reviewed. No pertinent family history. ?SocHx:  reports that he has never smoked. He has never used smokeless tobacco. He reports that he does not currently use alcohol. He reports that he does not use drugs. ? ?Physical Exam: ?Strength 5/5 x4 and SILTx4 except bilateral stocking distribution numbness ? ?Assesment/Plan: ?66 y.o. man with neurogenic claudication, here for L3-4 MIS laminectomy. Risks, benefits, and alternatives discussed and the patient would like to continue with surgery. ? ?-OR today ?-3C post-op ? ?Jadene Pierini, MD ?04/04/21 ?12:50 PM ? ?

## 2021-04-04 NOTE — Anesthesia Procedure Notes (Signed)
Procedure Name: Intubation ?Date/Time: 04/04/2021 1:55 PM ?Performed by: Katina Degree, CRNA ?Pre-anesthesia Checklist: Patient identified, Emergency Drugs available, Suction available and Patient being monitored ?Patient Re-evaluated:Patient Re-evaluated prior to induction ?Oxygen Delivery Method: Circle system utilized ?Preoxygenation: Pre-oxygenation with 100% oxygen ?Induction Type: IV induction ?Ventilation: Mask ventilation without difficulty and Oral airway inserted - appropriate to patient size ?Laryngoscope Size: Glidescope and 4 ?Grade View: Grade I ?Tube type: Oral ?Tube size: 7.5 mm ?Number of attempts: 1 ?Airway Equipment and Method: Stylet and Oral airway ?Placement Confirmation: ETT inserted through vocal cords under direct vision, positive ETCO2 and breath sounds checked- equal and bilateral ?Secured at: 23 cm ?Tube secured with: Tape ?Dental Injury: Teeth and Oropharynx as per pre-operative assessment  ?Comments: Glidescope used due to limited mouth opening and limited neck range of motion. ? ? ? ? ?

## 2021-04-04 NOTE — Op Note (Signed)
PATIENT: Steven Frazier ? ?DAY OF SURGERY: 04/04/21 ?  ?PRE-OPERATIVE DIAGNOSIS:  Lumbar stenosis with neurogenic claudication ?  ?POST-OPERATIVE DIAGNOSIS:  Lumbar stenosis with neurogenic claudication ?  ?PROCEDURE:  Minimally invasive L3-4 laminectomy with L3-4 discectomy ?  ?SURGEON:  Surgeon(s) and Role: ?   Jadene Pierini, MD - Primary ?  ?ANESTHESIA: ETGA ?  ?BRIEF HISTORY: This is a 66 year old man who presented with symptoms of neurogenic claudication. The patient was found to have multilevel canal stenosis. He had a disc herniation at L3-4 that best correlated with his acute onset of symptoms, I therefore recommended an L3-4 laminectomy and discectomy. This was discussed with the patient as well as risks, benefits, and alternatives and the patient wished to proceed with surgical treatment. ?  ?OPERATIVE DETAIL: The patient was taken to the operating room and placed on the OR table in the prone position. A formal time out was performed with two patient identifiers and confirmed the operative site. Anesthesia was induced by the anesthesia team. The operative site was marked, hair was clipped with surgical clippers, the area was then prepped and draped in a sterile fashion. Fluoroscopy was used to identify the surgical level with a spinal needle. A 2cm incision was then marked 1cm off to the left of midline.  ? ?The fascia was incised sharply and serial dilators were docked to the spinolaminar interface on L3. A final tubular distractor was placed and secured to the table and fluoro again confirmed the correct surgical level. The spinous process, lamina, and facet locations were confirmed for orientation. A high speed drill was then used to perform a L3 laminotomy until the insertion of the ligamentum flavum was identified superiorly. This was then extended to complete the  hemilaminotomy by extending medially to the midline and laterally to the lateral insertion of the ligamentum flavum. The table was  tilted and tubular dilator was adjusted to look across midline. An 'over the top' decompression was then performed by dissecting the ligamentum flavum off of the spinous process and lamina and identifying its insertion superiorly on the lamina and laterally at the facet. A #9 suction was then used to protect the thecal sac while the contralateral lamina was removed. With drilling complete, the ligamentum flavum was then completely resected from its insertion.  ? ?The thecal sac and traversing root were retracted on the left and a discectomy was performed. The disc herniation was calcified and quite firm with small fragments, no obvious large fragment was obtained. This was taken contralaterally as far as possible but difficult to address fully from a unilateral approach in the setting of a calcified disc. The dura was palpated in all directions to confirm good decompression.  ? ?Hemostasis was obtained, the wound was copiously irrigated, and the tube was removed while using the microscope to confirm hemostasis of the muscle edges. All instrument and sponge counts were correct and the incision was then closed in layers. The patient was then returned to anesthesia for emergence. No apparent complications at the completion of the procedure. ?  ?EBL:  33mL ?  ?DRAINS: none ?  ?SPECIMENS: none ?  ?Jadene Pierini, MD ?04/04/21 ?3:37 PM ? ?

## 2021-04-04 NOTE — Anesthesia Postprocedure Evaluation (Signed)
Anesthesia Post Note ? ?Patient: Steven Frazier ? ?Procedure(s) Performed: Lumbar three-four Minimally Invasive Surgery Laminectomy, discectomy ? ?  ? ?Patient location during evaluation: PACU ?Anesthesia Type: General ?Level of consciousness: awake and alert ?Pain management: pain level controlled ?Vital Signs Assessment: post-procedure vital signs reviewed and stable ?Respiratory status: spontaneous breathing, nonlabored ventilation, respiratory function stable and patient connected to nasal cannula oxygen ?Cardiovascular status: blood pressure returned to baseline and stable ?Postop Assessment: no apparent nausea or vomiting ?Anesthetic complications: no ? ? ?No notable events documented. ? ?Last Vitals:  ?Vitals:  ? 04/04/21 1620 04/04/21 1650  ?BP: 118/75 129/79  ?Pulse: 84 85  ?Resp: 13 18  ?Temp:  36.6 ?C  ?SpO2: 91% 94%  ?  ?Last Pain:  ?Vitals:  ? 04/04/21 1650  ?TempSrc: Oral  ?PainSc:   ? ? ?  ?  ?  ?  ?  ?  ? ?[place] ? ? ? ? ?

## 2021-04-04 NOTE — Transfer of Care (Signed)
Immediate Anesthesia Transfer of Care Note ? ?Patient: Steven Frazier ? ?Procedure(s) Performed: Lumbar three-four Minimally Invasive Surgery Laminectomy, discectomy ? ?Patient Location: PACU ? ?Anesthesia Type:General ? ?Level of Consciousness: awake ? ?Airway & Oxygen Therapy: Patient Spontanous Breathing and Patient connected to face mask oxygen ? ?Post-op Assessment: Report given to RN, Post -op Vital signs reviewed and stable and Patient moving all extremities X 4 ? ?Post vital signs: Reviewed and stable ? ?Last Vitals:  ?Vitals Value Taken Time  ?BP 107/72 04/04/21 1548  ?Temp    ?Pulse 85 04/04/21 1550  ?Resp 25 04/04/21 1550  ?SpO2 91 % 04/04/21 1550  ?Vitals shown include unvalidated device data. ? ?Last Pain:  ?Vitals:  ? 04/04/21 1118  ?TempSrc:   ?PainSc: 0-No pain  ?   ? ?  ? ?Complications: No notable events documented. ?

## 2021-04-05 ENCOUNTER — Encounter (HOSPITAL_COMMUNITY): Payer: Self-pay | Admitting: Neurological Surgery

## 2021-04-05 DIAGNOSIS — M48062 Spinal stenosis, lumbar region with neurogenic claudication: Secondary | ICD-10-CM | POA: Diagnosis not present

## 2021-04-05 MED ORDER — CYCLOBENZAPRINE HCL 10 MG PO TABS
10.0000 mg | ORAL_TABLET | Freq: Three times a day (TID) | ORAL | 0 refills | Status: DC | PRN
Start: 2021-04-05 — End: 2021-07-12

## 2021-04-05 MED ORDER — OXYCODONE HCL 5 MG PO TABS: 5.0000 mg | ORAL_TABLET | ORAL | 0 refills | Status: DC | PRN

## 2021-04-05 NOTE — Evaluation (Signed)
Occupational Therapy Evaluation ?Patient Details ?Name: Steven Frazier ?MRN: [id number] ?DOB: November 13, 1955 ?Today's Date: 04/05/2021 ? ? ?History of Present Illness 65 y/o male admitted on 04/04/21 following minimally invasive L3-4 laminectomy with discectomy. PMH: HTN, CHF, adrenal insufficiency.  ? ?Clinical Impression ?  ?Patient admitted for the procedure above, PTA he did walk with a cane, and family did provide assist as needed.  Pain is his primary deficit, and HH has already been arranged.  Currently he is needing Min Guard for mobility, Min A for bed mobility, and up to Mod A for ADL from a sit/stand level.  OT will follow him if he does not discharge home.  All questions have been answered, and all precautions reviewed.    ?   ? ?Recommendations for follow up therapy are one component of a multi-disciplinary discharge planning process, led by the attending physician.  Recommendations may be updated based on patient status, additional functional criteria and insurance authorization.  ? ?Follow Up Recommendations ? Home health OT  ?  ?Assistance Recommended at Discharge Intermittent Supervision/Assistance  ?Patient can return home with the following   ? ?  ?Functional Status Assessment ? Patient has had a recent decline in their functional status and demonstrates the ability to make significant improvements in function in a reasonable and predictable amount of time.  ?Equipment Recommendations ? Tub/shower seat  ?  ?Recommendations for Other Services   ? ? ?  ?Precautions / Restrictions Precautions ?Precautions: Back ?Precaution Booklet Issued: Yes (comment) ?Precaution Comments: Reviewed ?Restrictions ?Weight Bearing Restrictions: No  ? ?  ? ?Mobility Bed Mobility ?Overal bed mobility: Needs Assistance ?Bed Mobility: Sidelying to Sit, Sit to Sidelying ?  ?Sidelying to sit: Min assist ?  ?  ?Sit to sidelying: Min assist ?  ?  ? ?Transfers ?Overall transfer level: Needs assistance ?Equipment used: Straight  cane ?Transfers: Sit to/from Stand, Bed to chair/wheelchair/BSC ?Sit to Stand: Min guard ?  ?  ?Step pivot transfers: Min guard ?  ?  ?  ?  ? ?  ?Balance Overall balance assessment: Needs assistance ?Sitting-balance support: Feet supported ?Sitting balance-Leahy Scale: Fair ?  ?  ?Standing balance support: Single extremity supported ?Standing balance-Leahy Scale: Fair ?  ?  ?  ?  ?  ?  ?  ?  ?  ?  ?  ?  ?   ? ?ADL either performed or assessed with clinical judgement  ? ?ADL Overall ADL's : Needs assistance/impaired ?Eating/Feeding: Independent ?  ?Grooming: Wash/dry hands;Wash/dry face;Min guard;Standing ?  ?Upper Body Bathing: Set up;Sitting ?  ?Lower Body Bathing: Moderate assistance;Sit to/from stand ?  ?Upper Body Dressing : Minimal assistance;Sitting ?  ?Lower Body Dressing: Moderate assistance;Sit to/from stand ?  ?Toilet Transfer: Min guard;Ambulation ?  ?Toileting- Clothing Manipulation and Hygiene: Supervision/safety;Sit to/from stand ?  ?  ?  ?  ?   ? ? ? ?Vision Patient Visual Report: No change from baseline ?   ?   ?Perception Perception ?Perception: Within Functional Limits ?  ?Praxis Praxis ?Praxis: Intact ?  ? ?Pertinent Vitals/Pain Pain Assessment ?Pain Assessment: Faces ?Faces Pain Scale: Hurts whole lot ?Pain Location: Low back ?Pain Descriptors / Indicators: Operative site guarding, Grimacing, Guarding ?Pain Intervention(s): Monitored during session  ? ? ? ?Hand Dominance Right ?  ?Extremity/Trunk Assessment Upper Extremity Assessment ?Upper Extremity Assessment: Overall WFL for tasks assessed ?  ?Lower Extremity Assessment ?Lower Extremity Assessment: Defer to PT evaluation ?  ?Cervical / Trunk Assessment ?Cervical / Trunk Assessment: Back Surgery ?  ?  Communication Communication ?Communication: No difficulties ?  ?Cognition Arousal/Alertness: Awake/alert ?Behavior During Therapy: [facility] for tasks assessed/performed ?Overall Cognitive Status: Within Functional Limits for tasks assessed ?  ?  ?  ?  ?   ?  ?  ?  ?  ?  ?  ?  ?  ?  ?  ?  ?  ?  ?  ?General Comments   VSS on RA ? ?  ?Exercises   ?  ?Shoulder Instructions    ? ? ?Home Living Family/patient expects to be discharged to:: Private residence ?Living Arrangements: Spouse/significant other;Children ?Available Help at Discharge: Family;Available 24 hours/day ?Type of Home: House ?Home Access: Stairs to enter ?Entrance Stairs-Number of Steps: 4-5 ?Entrance Stairs-Rails: Right ?Home Layout: One level ?  ?  ?Bathroom Shower/Tub: Walk-in shower ?  ?Bathroom Toilet: Standard ?  ?  ?Home Equipment: Conservation officer, nature (2 wheels);Cane - single point ?  ?  ?  ? ?  ?Prior Functioning/Environment Prior Level of Function : Needs assist ?  ?  ?  ?Physical Assist : ADLs (physical) ?  ?ADLs (physical): IADLs ?  ?ADLs Comments: Patient was assist as needed for lower body ADL at times.  Spouse and son assisted with community mobility, meals and home management. ?  ? ?  ?  ?OT Problem List: Pain;Impaired balance (sitting and/or standing) ?  ?   ?OT Treatment/Interventions: Self-care/ADL training;Balance training;Therapeutic activities;DME and/or AE instruction  ?  ?OT Goals(Current goals can be found in the care plan section) Acute Rehab OT Goals ?Patient Stated Goal: Not hurt so much ?OT Goal Formulation: With patient ?Time For Goal Achievement: 04/19/21 ?Potential to Achieve Goals: Good ?ADL Goals ?Pt Will Perform Lower Body Dressing: with modified independence;with adaptive equipment ?Pt Will Transfer to Toilet: with modified independence;ambulating;regular height toilet  ?OT Frequency: Min 2X/week ?  ? ?Co-evaluation   ?  ?  ?  ?  ? ?  ?AM-PAC OT "6 Clicks" Daily Activity     ?Outcome Measure Help from another person eating meals?: None ?Help from another person taking care of personal grooming?: A Little ?Help from another person toileting, which includes using toliet, bedpan, or urinal?: A Little ?Help from another person bathing (including washing, rinsing, drying)?: A  Lot ?Help from another person to put on and taking off regular upper body clothing?: A Little ?Help from another person to put on and taking off regular lower body clothing?: A Lot ?6 Click Score: 17 ?  ?End of Session Nurse Communication: Mobility status ? ?Activity Tolerance: Patient limited by pain ?Patient left: in bed;with call bell/phone within reach ? ?OT Visit Diagnosis: Unsteadiness on feet (R26.81);Pain  ?              ?Time: [id number] ?OT Time Calculation (min): 24 min ?Charges:  OT General Charges ?$OT Visit: 1 Visit ?OT Evaluation ?$OT Eval Moderate Complexity: 1 Mod ?OT Treatments ?$Self Care/Home Management : 8-22 mins ? ?04/05/2021 ? ?RP, OTR/L ? ?Acute Rehabilitation Services ? ?Office:  [phone] ? ? ?Yajahira Tison D Darion Milewski ?04/05/2021, 8:44 AM ?

## 2021-04-05 NOTE — Discharge Summary (Signed)
Discharge Summary ? ?Date of Admission: 04/04/2021 ? ?Date of Discharge: 04/05/21 ? ?Attending Physician: Emelda Brothers, MD ? ?Hospital Course: Patient was admitted following an uncomplicated [id number] MIS decompression and discectomy. They were recovered in PACU and transferred to [facility]. Their preop symptoms were improved, their hospital course was uncomplicated and the patient was discharged home w/ home health on 04/05/21. They will follow up in clinic with me in clinic in 2 weeks. ? ?Neurologic exam at discharge:  ?Strength 5/5 x4 and SILTx4 except baseline stocking numbness bilaterally ? ?Discharge diagnosis: Lumbar radiculopathy ? ?Judith Part, MD ?04/05/21 ?8:15 AM ? ? ?

## 2021-04-05 NOTE — Evaluation (Signed)
Physical Therapy Evaluation ?Patient Details ?Name: Steven Frazier ?MRN: [id number] ?DOB: [birth year] ?Today's Date: 04/05/2021 ? ?History of Present Illness ? 66 y/o male admitted on 04/04/21 following minimally invasive L3-4 laminectomy with discectomy. PMH: HTN, CHF, adrenal insufficiency.  ?Clinical Impression ? Patient admitted following the above procedure. Patient presents with generalized weakness, decreased activity tolerance, and pain. Patient requires minA for bed mobility and supervision for transfers. Ambulated hallway distance with cane and supervision but with increased pain. Patient requires minA for stair negotiation with use of rail and cane. Educated patient on back precautions and progressive walking program, patient demonstrated understanding. Patient will benefit from skilled PT services during acute stay to address listed deficits. Recommend HHPT at discharge to maximize functional independence.    ?   ? ?Recommendations for follow up therapy are one component of a multi-disciplinary discharge planning process, led by the attending physician.  Recommendations may be updated based on patient status, additional functional criteria and insurance authorization. ? ?Follow Up Recommendations Home health PT ? ?  ?Assistance Recommended at Discharge Frequent or constant Supervision/Assistance  ?Patient can return home with the following ? A little help with walking and/or transfers;A little help with bathing/dressing/bathroom;Assistance with cooking/housework;Help with stairs or ramp for entrance ? ?  ?Equipment Recommendations None recommended by PT  ?Recommendations for Other Services ?    ?  ?Functional Status Assessment Patient has had a recent decline in their functional status and demonstrates the ability to make significant improvements in function in a reasonable and predictable amount of time.  ? ?  ?Precautions / Restrictions Precautions ?Precautions: Back ?Precaution Booklet Issued: Yes  (comment) ?Precaution Comments: Reviewed ?Restrictions ?Weight Bearing Restrictions: No  ? ?  ? ?Mobility ? Bed Mobility ?Overal bed mobility: Needs Assistance ?Bed Mobility: Rolling, Sidelying to Sit, Sit to Sidelying ?Rolling: Min guard ?Sidelying to sit: Min assist ?  ?  ?Sit to sidelying: Min guard ?  ?  ? ?Transfers ?Overall transfer level: Needs assistance ?Equipment used: Straight cane ?Transfers: Sit to/from Stand ?Sit to Stand: Supervision ?  ?  ?  ?  ?  ?  ?  ? ?Ambulation/Gait ?Ambulation/Gait assistance: Supervision ?Gait Distance (Feet): 150 Feet ?Assistive device: Straight cane ?Gait Pattern/deviations: Step-to pattern, Decreased stride length, Antalgic, Trunk flexed ?Gait velocity: decreased ?Gait velocity interpretation: <1.31 ft/sec, indicative of household ambulator ?  ?General Gait Details: supervision for safety. decreased gait speed due to pain. Increased time to complete. ? ?Stairs ?Stairs: Yes ?Stairs assistance: Min assist ?Stair Management: One rail Right, Step to pattern, With cane, Forwards ?Number of Stairs: 5 ?General stair comments: minA for balance and cane management. Instructed patient sequencing with cane and up with good and down with bad ? ?Wheelchair Mobility ?  ? ?Modified Rankin (Stroke Patients Only) ?  ? ?  ? ?Balance Overall balance assessment: Needs assistance ?Sitting-balance support: Feet supported ?Sitting balance-Leahy Scale: Fair ?  ?  ?Standing balance support: Single extremity supported ?Standing balance-Leahy Scale: Fair ?  ?  ?  ?  ?  ?  ?  ?  ?  ?  ?  ?  ?   ? ? ? ?Pertinent Vitals/Pain Pain Assessment ?Pain Assessment: Faces ?Faces Pain Scale: Hurts whole lot ?Pain Location: Low back ?Pain Descriptors / Indicators: Operative site guarding, Grimacing, Guarding ?Pain Intervention(s): Monitored during session, Repositioned  ? ? ?Home Living Family/patient expects to be discharged to:: Private residence ?Living Arrangements: Spouse/significant  other;Children ?Available Help at Discharge: Family;Available 24 hours/day ?Type of Home: House ?  Home Access: Stairs to enter ?Entrance Stairs-Rails: Right ?Entrance Stairs-Number of Steps: 4-5 ?  ?Home Layout: One level ?Home Equipment: Conservation officer, nature (2 wheels);Cane - single point ?   ?  ?Prior Function Prior Level of Function : Needs assist ?  ?  ?  ?Physical Assist : ADLs (physical) ?  ?ADLs (physical): IADLs ?  ?ADLs Comments: Patient was assist as needed for lower body ADL at times.  Spouse and son assisted with community mobility, meals and home management. ?  ? ? ?Hand Dominance  ? Dominant Hand: Right ? ?  ?Extremity/Trunk Assessment  ? Upper Extremity Assessment ?Upper Extremity Assessment: Defer to OT evaluation ?  ? ?Lower Extremity Assessment ?Lower Extremity Assessment: Generalized weakness ?  ? ?Cervical / Trunk Assessment ?Cervical / Trunk Assessment: Back Surgery  ?Communication  ? Communication: No difficulties  ?Cognition Arousal/Alertness: Awake/alert ?Behavior During Therapy: [facility] for tasks assessed/performed ?Overall Cognitive Status: Within Functional Limits for tasks assessed ?  ?  ?  ?  ?  ?  ?  ?  ?  ?  ?  ?  ?  ?  ?  ?  ?  ?  ?  ? ?  ?General Comments   ? ?  ?Exercises    ? ?Assessment/Plan  ?  ?PT Assessment Patient needs continued PT services  ?PT Problem List Decreased strength;Decreased activity tolerance;Decreased balance;Decreased mobility;Decreased knowledge of precautions;Pain ? ?   ?  ?PT Treatment Interventions DME instruction;Gait training;Stair training;Functional mobility training;Therapeutic activities;Balance training;Therapeutic exercise;Patient/family education   ? ?PT Goals (Current goals can be found in the Care Plan section)  ?Acute Rehab PT Goals ?Patient Stated Goal: to reduce pain ?PT Goal Formulation: With patient ?Time For Goal Achievement: 04/19/21 ?Potential to Achieve Goals: Good ? ?  ?Frequency Min 5X/week ?  ? ? ?Co-evaluation   ?  ?  ?  ?  ? ? ?  ?AM-PAC PT "6  Clicks" Mobility  ?Outcome Measure Help needed turning from your back to your side while in a flat bed without using bedrails?: A Little ?Help needed moving from lying on your back to sitting on the side of a flat bed without using bedrails?: A Little ?Help needed moving to and from a bed to a chair (including a wheelchair)?: A Little ?Help needed standing up from a chair using your arms (e.g., wheelchair or bedside chair)?: A Little ?Help needed to walk in hospital room?: A Little ?Help needed climbing 3-5 steps with a railing? : A Little ?6 Click Score: 18 ? ?  ?End of Session   ?Activity Tolerance: Patient limited by pain ?Patient left: in bed;with call bell/phone within reach ?Nurse Communication: Mobility status ?PT Visit Diagnosis: Unsteadiness on feet (R26.81);Muscle weakness (generalized) (M62.81);Pain ?Pain - part of body:  (back) ?  ? ?Time: [id number] ?PT Time Calculation (min) (ACUTE ONLY): 14 min ? ? ?Charges:   PT Evaluation ?$PT Eval Moderate Complexity: 1 Mod ?  ?  ?   ? ? ?Ovila Lepage A. Gilford Rile, PT, DPT ?Acute Rehabilitation Services ?Pager [phone] ?Office [phone] ? ? ?Waynesha Rammel A Eloisa Chokshi ?04/05/2021, 10:12 AM ? ?

## 2021-04-05 NOTE — Progress Notes (Signed)
Patient alert and oriented, voiding adequately, skin clean, dry and intact without evidence of skin break down, or symptoms of complications - no redness or edema noted, only slight tenderness at site.  Patient states pain is manageable at time of discharge. Patient has an appointment with MD in 2 weeks 

## 2021-04-05 NOTE — Progress Notes (Signed)
Neurosurgery Service ?Progress Note ? ?Subjective: No acute events overnight, leg pain improved, back pain around the incision  ? ?Objective: ?Vitals:  ? 04/04/21 1919 04/04/21 2257 04/05/21 0324 04/05/21 0710  ?BP: 101/66 112/77 112/69 114/88  ?Pulse: 79 84 85 80  ?Resp: 18 18 18 18   ?Temp: 97.9 ?F (36.6 ?C) 98.1 ?F (36.7 ?C) 98.3 ?F (36.8 ?C) 97.6 ?F (36.4 ?C)  ?TempSrc: Oral Oral Oral Oral  ?SpO2: 94% 92% 94% 91%  ?Weight:      ?Height:      ? ? ?Physical Exam: ?Strength 5/5 x4 and SILTx4 except bilateral stocking distribution numbness ? ?Assessment & Plan: ?66 y.o. man s/p MIS laminectomy and discectomy, recovering well. ? ?-discharge home this afternoon w/ home health ? ?[address] Darley  ?04/05/21 ?8:12 AM ? ?

## 2021-04-05 NOTE — TOC Transition Note (Signed)
Transition of Care (TOC) - CM/SW Discharge Note ? ? ?Patient Details  ?Name: Steven Frazier ?MRN: [id number] ?Date of Birth: 01/21/1956 ? ?Transition of Care (TOC) CM/SW Contact:  ?Lawerance Sabal, RN ?Phone Number: ?04/05/2021, 8:46 AM ? ? ?Clinical Narrative:    ?Spoke to patient at bedside to plan DC.  ?He is agreeable to [facility] plan and does not have a preference for provider.  ?Referral made to [facility]. Accepted. ?Unit staff to supply any DME needed. ?No other TOC needs identified ? ? ? ?Final next level of care: Home w Home Health Services ?Barriers to Discharge: No Barriers Identified ? ? ?Patient Goals and CMS Choice ?Patient states their goals for this hospitalization and ongoing recovery are:: to go home ?CMS Medicare.gov Compare Post Acute Care list provided to:: Patient ?Choice offered to / list presented to : Patient ? ?Discharge Placement ?  ?           ?  ?  ?  ?  ? ?Discharge Plan and Services ?  ?  ?           ?  ?  ?  ?  ?  ?HH Arranged: PT, OT, Nurse's Aide ?HH Agency: [facility] ?Date HH Agency Contacted: 04/05/21 ?Time HH Agency Contacted: [phone] ?Representative spoke with at [facility] Agency: Angie C ? ?Social Determinants of Health (SDOH) Interventions ?  ? ? ?Readmission Risk Interventions ?Readmission Risk Prevention Plan 09/08/2019  ?Post Dischage Appt Complete  ?Medication Screening Complete  ?Transportation Screening Complete  ? ? ? ? ? ?

## 2021-04-19 ENCOUNTER — Other Ambulatory Visit (HOSPITAL_BASED_OUTPATIENT_CLINIC_OR_DEPARTMENT_OTHER): Payer: Self-pay

## 2021-04-19 MED ORDER — OXYCODONE-ACETAMINOPHEN 5-325 MG PO TABS
ORAL_TABLET | ORAL | 0 refills | Status: DC
  Filled 2021-04-19: qty 30, 5d supply, fill #0

## 2021-04-19 MED ORDER — CYCLOBENZAPRINE HCL 10 MG PO TABS
ORAL_TABLET | ORAL | 2 refills | Status: DC
  Filled 2021-04-19: qty 30, 10d supply, fill #0

## 2021-05-01 ENCOUNTER — Other Ambulatory Visit (HOSPITAL_COMMUNITY): Payer: Self-pay | Admitting: *Deleted

## 2021-05-01 MED ORDER — SPIRONOLACTONE 25 MG PO TABS: 25.0000 mg | ORAL_TABLET | Freq: Every day | ORAL | 3 refills | Status: DC

## 2021-05-01 MED ORDER — TORSEMIDE 20 MG PO TABS: 20.0000 mg | ORAL_TABLET | Freq: Every day | ORAL | 3 refills | Status: DC

## 2021-05-02 ENCOUNTER — Other Ambulatory Visit (HOSPITAL_COMMUNITY): Payer: Self-pay

## 2021-05-02 MED ORDER — FARXIGA 10 MG PO TABS
10.0000 mg | ORAL_TABLET | Freq: Every day | ORAL | 0 refills | Status: DC
  Filled 2021-05-02: qty 90, 90d supply, fill #0

## 2021-05-03 ENCOUNTER — Other Ambulatory Visit: Payer: Self-pay | Admitting: Internal Medicine

## 2021-06-01 ENCOUNTER — Other Ambulatory Visit (HOSPITAL_COMMUNITY): Payer: Self-pay

## 2021-06-01 MED ORDER — DAPAGLIFLOZIN PROPANEDIOL 10 MG PO TABS: 10.0000 mg | ORAL_TABLET | Freq: Every day | ORAL | 0 refills | Status: DC

## 2021-06-01 MED ORDER — DAPAGLIFLOZIN PROPANEDIOL 10 MG PO TABS: 10.0000 mg | ORAL_TABLET | Freq: Every day | ORAL | 3 refills | Status: DC

## 2021-06-12 ENCOUNTER — Encounter (HOSPITAL_COMMUNITY): Payer: Self-pay | Admitting: Internal Medicine

## 2021-06-23 ENCOUNTER — Telehealth (HOSPITAL_COMMUNITY): Payer: Self-pay | Admitting: Vascular Surgery

## 2021-06-23 NOTE — Telephone Encounter (Signed)
Pt son called to get pt sooner appt due cp and swelling please advise

## 2021-06-27 NOTE — Telephone Encounter (Signed)
Called to see if pt could come in this afternoon no answer.

## 2021-06-29 ENCOUNTER — Other Ambulatory Visit (HOSPITAL_COMMUNITY): Payer: Self-pay

## 2021-07-05 ENCOUNTER — Other Ambulatory Visit (HOSPITAL_COMMUNITY): Payer: Self-pay

## 2021-07-05 ENCOUNTER — Telehealth (HOSPITAL_COMMUNITY): Payer: Self-pay | Admitting: Pharmacy Technician

## 2021-07-05 NOTE — Telephone Encounter (Signed)
Patient Advocate Encounter   Received notification from [facility] that prior authorization for Isosorb Dinitrate-hydrALAZINE is required.   PA submitted on CoverMyMeds Key [id number] Status is pending   Will continue to follow.

## 2021-07-06 NOTE — Telephone Encounter (Signed)
Advanced Heart Failure Patient Advocate Encounter  Prior Authorization for Isosorb Dinitrate-hydrALAZINE has been approved.    PA# [id number] Effective dates: 01/29/21 through 01/28/22  Updated patient via mychart.  Archer Asa, CPhT

## 2021-07-12 ENCOUNTER — Ambulatory Visit (HOSPITAL_COMMUNITY)
Admission: RE | Admit: 2021-07-12 | Discharge: 2021-07-12 | Disposition: A | Discharge status: Home / Self Care | Payer: Medicare Other | Source: Ambulatory Visit | Attending: Internal Medicine | Admitting: Internal Medicine

## 2021-07-12 ENCOUNTER — Encounter (HOSPITAL_COMMUNITY): Payer: Self-pay | Admitting: Internal Medicine

## 2021-07-12 ENCOUNTER — Ambulatory Visit (HOSPITAL_BASED_OUTPATIENT_CLINIC_OR_DEPARTMENT_OTHER)
Admission: RE | Admit: 2021-07-12 | Discharge: 2021-07-12 | Disposition: A | Discharge status: Home / Self Care | Payer: Medicare Other | Source: Ambulatory Visit | Attending: Internal Medicine | Admitting: Internal Medicine

## 2021-07-12 VITALS — BP 128/80 | HR 86 | Wt 153.4 lb

## 2021-07-12 DIAGNOSIS — I110 Hypertensive heart disease with heart failure: Secondary | ICD-10-CM | POA: Insufficient documentation

## 2021-07-12 DIAGNOSIS — I440 Atrioventricular block, first degree: Secondary | ICD-10-CM

## 2021-07-12 DIAGNOSIS — I5022 Chronic systolic (congestive) heart failure: Secondary | ICD-10-CM

## 2021-07-12 DIAGNOSIS — E2740 Unspecified adrenocortical insufficiency: Secondary | ICD-10-CM

## 2021-07-12 LAB — ECHOCARDIOGRAM COMPLETE
Calc EF: 45.7 %
S' Lateral: 2.8 cm
Single Plane A2C EF: 52.4 %
Single Plane A4C EF: 41.2 %

## 2021-07-12 LAB — BASIC METABOLIC PANEL
Anion gap: 8 (ref 5–15)
BUN: 13 mg/dL (ref 8–23)
CO2: 30 mmol/L (ref 22–32)
Calcium: 8.9 mg/dL (ref 8.9–10.3)
Chloride: 102 mmol/L (ref 98–111)
Creatinine, Ser: 1.35 mg/dL — ABNORMAL HIGH (ref 0.61–1.24)
GFR, Estimated: 58 mL/min — ABNORMAL LOW (ref >60)
Glucose, Bld: 128 mg/dL — ABNORMAL HIGH (ref 70–99)
Potassium: 3.9 mmol/L (ref 3.5–5.1)
Sodium: 140 mmol/L (ref 135–145)

## 2021-07-12 NOTE — Progress Notes (Signed)
Advanced Heart Failure Clinic Note    PCP: Trey Sailors, PA PCP-Cardiologist: Dr. Haroldine Laws   Reason for Visit: Heart Failure   HPI: Steven Frazier is a 66 y.o. from [place] ([place]igrated [birth year]) with systolic HF (EF 37-85%), HTN, and hypothyroidism.    Admitted 8/21 with hypervolemic hyponatremia in the setting of systolic HF with Na 885. Also noted to have unintentional significant weight loss (~20-25 pounds), low albumin, microcytic anemia and eosinophilia. Work-up including peripheral smear, HIV, hepatitis and parasitic screens negative.    Readmitted 10/21 with marked volume overload. Had [facility] showing severe NICM EF 20%, with normal coronaries and mildly elevated filling pressures w/ normal cardiac output. Found to have adrenal insuffiencey. Cortisol level 0.4. D/w endocrinology who recommended hydrocortisone 10 mg qam/ 5 mg pm + brain MRI w/ pituitary protocol. MRI was unremarkable.   cMRI 9/22 1. Mild LVE diffuse hypokinesis EF 44% improved from 11/17/19 measured at 37% then 2. Persistent mid myocardial LGE more impressivein antero and inferolateral walls compared to previous study including some mottled uptake in the septum and apex 3.  Small lateral pericardial effusion 4. Abnormal parametrics including elevated T1 mildly elevated T2 in regions with gadolinium uptake and elevated ECV ? Sarcoid vs infiltrative cardiomyopathy.  5.  Normal RV size and function   Today he returns for HF follow up with his son. Overall feeling fine. Has noticed chest soreness last month but has since resolved. He is not SOB with ADLs or mild activity. Denies palpitations, dizziness, edema, or PND/Orthopnea. Appetite ok. No fever or chills. Weight at home 152-153 pounds. Taking all medications.   Echo today 07/12/21 EF 50-55%, reviewed by Dr. Haroldine Laws   Cardiac Studies   Echo 6/23 EF 50-55% Echo 3/22 EF 45-50%   Echo 8/21: EF 40%. RV mildly reduced   R/LHC 10/21:  Ao = 123/72 (94) LV =  122/15 RA = 9 RV = 48/12 PA = 49/17 (31) PCW = 22 (v = 31) Fick cardiac output/index = 4.1/2.4 PVR = 2.2 WU  FA sat = 99% PA sat = 68%, 69% PAPi = 3.5  Assessment: 1. Severe NICM EF 20% 2. Normal coronaries 3. Mildly elevated filling pressures with normal CO  cMRI 10/21 1.  Normal LV size with EF 37%, diffuse hypokinesis. 2. Moderately dilated RV with EF 34%, moderately decreased systolic function. 3. Non-coronary pattern LGE. The LGE is extensive and mid-wall, suggesting possible prior myocarditis versus infiltrative disease. Lack of LV hypertrophy and ECV % < 40% makes cardiac amyloidosis less likely.  Review of systems complete and found to be negative unless listed in HPI.    Past Medical History:  Diagnosis Date   Adrenal insufficiency (HCC)    CHF (congestive heart failure) (HCC)    Heart failure (HCC)    Hypertension    Hyponatremia    Hypothyroidism    Shoulder pain    Current Outpatient Medications  Medication Sig Dispense Refill   aspirin EC 81 MG EC tablet Take 1 tablet (81 mg total) by mouth daily. Swallow whole. 30 tablet 11   dapagliflozin propanediol (FARXIGA) 10 MG TABS tablet Take 1 tablet (10 mg total) by mouth daily. 30 tablet 3   folic acid (FOLVITE) 1 MG tablet Take 1 tablet (1 mg total) by mouth daily. 30 tablet 0   hydrocortisone (CORTEF) 10 MG tablet Take 10 mg by mouth daily.     hydrocortisone (CORTEF) 5 MG tablet TAKE 1 TABLET BY MOUTH ONCE DAILY IN THE EVENING  30 tablet 2   isosorbide-hydrALAZINE (BIDIL) 20-37.5 MG tablet Take 2 tablets by mouth 3 (three) times daily. 180 tablet 3   levothyroxine (SYNTHROID) 50 MCG tablet Take 1 tablet (50 mcg total) by mouth daily. 90 tablet 3   MULTIPLE VITAMIN PO Take 1 tablet by mouth daily.     rosuvastatin (CRESTOR) 10 MG tablet Take 1 tablet (10 mg total) by mouth daily. 90 tablet 3   sacubitril-valsartan (ENTRESTO) 97-103 MG Take 1 tablet by mouth 2 (two) times daily. 60 tablet 3   spironolactone  (ALDACTONE) 25 MG tablet Take 1 tablet (25 mg total) by mouth at bedtime. 30 tablet 3   torsemide (DEMADEX) 20 MG tablet Take 1 tablet (20 mg total) by mouth daily. 30 tablet 3   No current facility-administered medications for this encounter.   No Known Allergies  Social History   Socioeconomic History   Marital status: Married    Spouse name: Not on file   Number of children: 4   Years of education: Not on file   Highest education level: Not on file  Occupational History   Not on file  Tobacco Use   Smoking status: Never   Smokeless tobacco: Never  Vaping Use   Vaping Use: Never used  Substance and Sexual Activity   Alcohol use: Not Currently   Drug use: Never   Sexual activity: Not on file  Other Topics Concern   Not on file  Social History Narrative   Not on file   Social Determinants of Health   Financial Resource Strain: Not on file  Food Insecurity: No Food Insecurity (11/17/2019)   Hunger Vital Sign    Worried About Running Out of Food in the Last Year: Never true    Ran Out of Food in the Last Year: Never true  Transportation Needs: No Transportation Needs (11/17/2019)   PRAPARE - Hydrologist (Medical): No    Lack of Transportation (Non-Medical): No  Physical Activity: Not on file  Stress: Not on file  Social Connections: Not on file  Intimate Partner Violence: Not on file    History reviewed. No pertinent family history.  BP 128/80   Pulse 86   Wt 69.6 kg (153 lb 6.4 oz)   SpO2 94%   BMI 29.96 kg/m   Wt Readings from Last 3 Encounters:  07/12/21 69.6 kg (153 lb 6.4 oz)  04/04/21 71.2 kg (157 lb)  03/31/21 70.3 kg (155 lb)   PHYSICAL EXAM: General:  NAD. No resp difficulty HEENT: Normal Neck: Supple. Thick neck. Carotids 2+ bilat; no bruits. No lymphadenopathy or thryomegaly appreciated. Cor: PMI nondisplaced. Regular rate & rhythm. No rubs, gallops or murmurs. Lungs: faint RUL wheeze, diminished BS  throughout Abdomen: Soft, nontender, nondistended. No hepatosplenomegaly. No bruits or masses. Good bowel sounds. Extremities: No cyanosis, clubbing, rash, edema Neuro: Alert & oriented x 3, cranial nerves grossly intact. Moves all 4 extremities w/o difficulty. Affect pleasant.  ECG (personally reviewed):  ASSESSMENT & PLAN:  1.  Chronic Systolic Heart Failure - Echo 8/21 showed EF 40%. RV mildly reduced ---> ECHO 03/2020 EF 45-50%  - R/LHC 10/21 showed severe NICM EF 20%, with normal coronaries and mildly elevated filling pressures w/ normal cardiac output. - cMRI 10/21 suggestive of previous myocarditis vs infiltrative. EF 37% - PYP visual and quantitative assessment (grade 1 and H/CL = 1.18) are equivocal for transthyretin amyloidosis => probably not TTR amyloidosis. - auto-immune serologies negative. ESR 5, SPEP/serum light  chains negative - cMRI 9/22 EF 44%. Persistent mid myocardial LGE more impressivein antero and inferolateral walls compared to previous study including some mottled uptake in the septum and apex Abnormal parametrics including elevated T1 mildly elevated T2 in regions with gadolinium uptake and elevated ECV ? Sarcoid vs infiltrative cardiomyopathy. Normal RV size and function - Etiology of HF remains unclear. ? secondary adrenocortical insufficiency as etiology of HF. Serum cortisol level was markedly low at 0.4 (10/21)>>now on hydrocortisone 10 mg qam/ 5 mg pm. Brain MRI unrevealing. cMRI concerning for possible previous myocarditis vs sarcoid. Can consider PET but sarcoid would be treated by hydrocortisone so not sure it would change management. No evidence of pulmonary sarcoid on CT 10/21 - Echo today 07/12/21 showed EF 50-55%. - Stable NYHA II. Volume status stable.  - Continue torsemide 20 mg daily  - Continue spiro 25 daily - Continue Farxiga 10 mg daily - Continue Entresto 97-103 mg bid.    - Continue Bidil 2 tablet tid  - Have avoided ? blocker w/ marked 1st  degree AVB. - Check labs today   2. Adrenal Insuffiencey  - Serum Cortisol markedly low 0.4 10/21  - brain MRI w/ pituitary protocol unremarkable. - C/w hydrocortisone 10 mg qam/ 5 mg pm,  - Followed by  Endocrinology.   3. HTN  - Blood pressure well-controlled.  4. Marked 1 AVB - stable.  Rafael Bihari, FNP  12:42 PM  Patient seen and examined with the above-signed Advanced Practice Provider and/or Housestaff. I personally reviewed laboratory data, imaging studies and relevant notes. I independently examined the patient and formulated the important aspects of the plan. I have edited the note to reflect any of my changes or salient points. I have personally discussed the plan with the patient and/or family.  Doing well. NYHA I-II. Volume status looks good. Denies CP, SOB, orthopnea or PND> Complaint with meds. Echo today EF 50-55% Reviewed personally   General:  Well appearing. No resp difficulty HEENT: normal Neck: supple. no JVD. Carotids 2+ bilat; no bruits. No lymphadenopathy or thryomegaly appreciated. Cor: PMI nondisplaced. Regular rate & rhythm. No rubs, gallops or murmurs. Lungs: decreased throughout Abdomen: soft, nontender, nondistended. No hepatosplenomegaly. No bruits or masses. Good bowel sounds. Extremities: no cyanosis, clubbing, rash, edema ruddy skin Neuro: alert & orientedx3, cranial nerves grossly intact. moves all 4 extremities w/o difficulty. Affect pleasant  Stable NYHA I-II. Volume ok. Echo EF 50-55%. Continue current therapy. Have avoided b-blocker with 1AVB almost 358m.   Labs today.   DGlori Bickers MD  10:42 PM

## 2021-07-12 NOTE — Patient Instructions (Signed)
Medication Changes:  None, continue current medications  Lab Work:  Labs done today, your results will be available in MyChart, we will contact you for abnormal readings.  Testing/Procedures:  None  Referrals:  None  Special Instructions // Education:  Do the following things EVERYDAY: Weigh yourself in the morning before breakfast. Write it down and keep it in a log. Take your medicines as prescribed Eat low salt foods--Limit salt (sodium) to 2000 mg per day.  Stay as active as you can everyday Limit all fluids for the day to less than 2 liters   Follow-Up in: 1 year (June 2024), **PLEASE CALL OUR OFFICE IN APRIL 2024 TO SCHEDULE THIS APPOINTMENT  At the [facility], you and your health needs are our priority. We have a designated team specialized in the treatment of Heart Failure. This Care Team includes your primary Heart Failure Specialized Cardiologist (physician), Advanced Practice Providers (APPs- Physician Assistants and Nurse Practitioners), and Pharmacist who all work together to provide you with the care you need, when you need it.   You may see any of the following providers on your designated Care Team at your next follow up:  Dr Arvilla Meres Dr Carron Curie, NP Robbie Lis, [place] [facility], [place] Karle Plumber, PharmD   Please be sure to bring in all your medications bottles to every appointment.   Need to Contact us:  If you have any questions or concerns before your next appointment please send [place] a message through [place] or call our office at [phone].    TO LEAVE A MESSAGE FOR THE NURSE SELECT OPTION 2, PLEASE LEAVE A MESSAGE INCLUDING: YOUR NAME DATE OF BIRTH CALL BACK NUMBER REASON FOR CALL**this is important as we prioritize the call backs  YOU WILL RECEIVE A CALL BACK THE SAME DAY AS LONG AS YOU CALL BEFORE 4:00 PM

## 2021-07-13 ENCOUNTER — Other Ambulatory Visit: Payer: Self-pay | Admitting: Internal Medicine

## 2021-08-02 ENCOUNTER — Other Ambulatory Visit (HOSPITAL_COMMUNITY): Payer: Self-pay | Admitting: Family Medicine

## 2021-08-05 ENCOUNTER — Other Ambulatory Visit: Payer: Self-pay | Admitting: Internal Medicine

## 2021-10-28 ENCOUNTER — Other Ambulatory Visit: Payer: Self-pay | Admitting: Internal Medicine

## 2021-11-07 ENCOUNTER — Other Ambulatory Visit (HOSPITAL_COMMUNITY): Payer: Self-pay

## 2021-11-07 MED ORDER — HYDROCORTISONE 5 MG PO TABS
5.0000 mg | ORAL_TABLET | Freq: Every day | ORAL | 1 refills | Status: DC
  Filled 2021-11-07: qty 90, 90d supply, fill #0

## 2021-11-21 ENCOUNTER — Other Ambulatory Visit (HOSPITAL_COMMUNITY): Payer: Self-pay | Admitting: Internal Medicine

## 2021-12-13 ENCOUNTER — Ambulatory Visit (INDEPENDENT_AMBULATORY_CARE_PROVIDER_SITE_OTHER): Payer: Medicare Other | Admitting: Internal Medicine

## 2021-12-13 ENCOUNTER — Encounter: Payer: Self-pay | Admitting: Internal Medicine

## 2021-12-13 VITALS — BP 126/80 | HR 81 | Ht 60.0 in | Wt 150.5 lb

## 2021-12-13 DIAGNOSIS — E2740 Unspecified adrenocortical insufficiency: Secondary | ICD-10-CM | POA: Diagnosis not present

## 2021-12-13 DIAGNOSIS — E039 Hypothyroidism, unspecified: Secondary | ICD-10-CM

## 2021-12-13 LAB — CORTISOL: Cortisol, Plasma: 12.1 ug/dL

## 2021-12-13 LAB — TSH: TSH: 1.61 u[IU]/mL (ref 0.35–5.50)

## 2021-12-13 MED ORDER — HYDROCORTISONE 10 MG PO TABS: 10.0000 mg | ORAL_TABLET | Freq: Every day | ORAL | 3 refills | Status: AC

## 2021-12-13 MED ORDER — LEVOTHYROXINE SODIUM 50 MCG PO TABS: 50.0000 ug | ORAL_TABLET | Freq: Every day | ORAL | 3 refills | Status: AC

## 2021-12-13 MED ORDER — HYDROCORTISONE 5 MG PO TABS: 5.0000 mg | ORAL_TABLET | Freq: Every evening | ORAL | 3 refills | Status: AC

## 2021-12-13 NOTE — Patient Instructions (Signed)
-   Continue Hydrocortisone 10 mg, 1 tablet  with Breakfast  - Continue Hydrocortisone 5 mg, 1 tablet between 2 and 4 pm  - Continue levothyroxine 50 mcg daily        - ADRENAL INSUFFICIENCY SICK DAY RULES:  Should you face an extreme emotional or physical stress such as trauma, surgery or acute illness, this will require extra steroid coverage so that the body can meet that stress.   Without increasing the steroid dose you may experience severe weakness, headache, dizziness, nausea and vomiting and possibly a more serious deterioration in health.  Typically the dose of steroids will only need to be increased for a couple of days if you have an illness that is transient and managed in the community.   If you are unable to take/absorb an increased dose of steroids orally because of vomiting or diarrhea, you will urgently require steroid injections and should present to an Emergency Department.  The general advice for any serious illness is as follows: Double the normal daily steroid dose for up to 3 days if you have a temperature of more than 37.50C (99.52F) with signs of sickness, or severe emotional or physical distress Contact your primary care doctor and Endocrinologist if the illness worsens or it lasts for more than 3 days.  In cases of severe illness, urgent medical assistance should be promptly sought. If you experience vomiting/diarrhea or are unable to take steroids by mouth, please present to emergency room

## 2021-12-13 NOTE — Progress Notes (Signed)
Name: Steven Frazier  MRN/ DOB: [id number], 24-Jul-1955    Age/ Sex: 66 y.o., male     PCP: Trey Sailors, PA   Reason for Endocrinology Evaluation: Secondary adrenal insufficieincy     Initial Endocrinology Clinic Visit: 02/08/2020    PATIENT IDENTIFIER: [name] is a 66 y.o., male with a past medical history of CHF and HTN . He has followed with [facility] since 02/08/2120 for consultative assistance with management of his adrenal insufficiency.   HISTORICAL SUMMARY:   Pt presented to the ED in 08/2019 with hypervolemic hyponatremia ( Sodium 117 mmol/L) with low albumin at < 2 g/dL, low serum osmolality at 245 mOsm/Kg , TSH slightly elevated at 9.4 uIU/mL . Urine sodium was 26 mg/dL with a urine osmolality at 284 .   Pt had a random serum cortisol of 0.4 ug/dL on 11/16/2019 and was started on hydrocortisone  ACTH was undetectable in 01/2020      Moved from [place] , [place] ~ 2020   No prior use of steroids     SUBJECTIVE:   Today (12/13/2021):  Steven Frazier is here for adrenal insufficiency and hypothyroidism. He is accompanied by his son today.   He is s/p uncomplicated back surgery in March 2023, still has some back pain  He continues to follow-up with cardiology for CHF  Joya Gaskins has trended down  He denies dizziness Denies palpitations  Denies headaches Has some  vision changes Has denies constipation and diarrhea    Has lost his  medical alert bracelet   HC 10 mg, 1 tablet with Breakfast  HC 5 mg, 1 tablet between 2-4 pm  Levothyroxine 50 mcg daily     HISTORY:  Past Medical History:  Past Medical History:  Diagnosis Date   Adrenal insufficiency ([place])    CHF (congestive heart failure) ([place])    Heart failure ([place])    Hypertension    Hyponatremia    Hypothyroidism    Shoulder pain    Past Surgical History:  Past Surgical History:  Procedure Laterality Date   HERNIA REPAIR Right 2005   inguinal   LUMBAR LAMINECTOMY/ DECOMPRESSION WITH  MET-RX N/A 04/04/2021   Procedure: Lumbar three-four Minimally Invasive Surgery Laminectomy, discectomy;  Surgeon: Judith Part, MD;  Location: [facility];  Service: Neurosurgery;  Laterality: N/A;   RIGHT/LEFT HEART CATH AND CORONARY ANGIOGRAPHY N/A 11/16/2019   Procedure: RIGHT/LEFT HEART CATH AND CORONARY ANGIOGRAPHY;  Surgeon: Jolaine Artist, MD;  Location: [facility];  Service: Cardiovascular;  Laterality: N/A;   Social History:  reports that he has never smoked. He has never used smokeless tobacco. He reports that he does not currently use alcohol. He reports that he does not use drugs. Family History: No family history on file.   HOME MEDICATIONS: Allergies as of 12/13/2021   No Known Allergies      Medication List        Accurate as of December 13, 2021 10:35 AM. If you have any questions, ask your nurse or doctor.          aspirin EC 81 MG tablet Take 1 tablet (81 mg total) by mouth daily. Swallow whole.   dapagliflozin propanediol 10 MG Tabs tablet Commonly known as: Farxiga Take 1 tablet (10 mg total) by mouth daily.   Entresto 97-103 MG Generic drug: sacubitril-valsartan Take 1 tablet by mouth 2 (two) times daily.   folic acid 1 MG tablet Commonly known as: FOLVITE Take 1 tablet (  1 mg total) by mouth daily.   hydrocortisone 10 MG tablet Commonly known as: CORTEF Take 1 tablet (10 mg total) by mouth daily with breakfast. What changed:  when to take this Another medication with the same name was removed. Continue taking this medication, and follow the directions you see here. Changed by: Dorita Sciara, MD   hydrocortisone 5 MG tablet Commonly known as: CORTEF Take 1 tablet (5 mg total) by mouth every evening. He takes 5 pm between 2-4 pm and 10 mg with Breakfast daily What changed:  additional instructions Another medication with the same name was removed. Continue taking this medication, and follow the directions you see  here. Changed by: Dorita Sciara, MD   isosorbide-hydrALAZINE 20-37.5 MG tablet Commonly known as: BIDIL TAKE 2 TABLETS BY MOUTH THREE TIMES DAILY   levothyroxine 50 MCG tablet Commonly known as: SYNTHROID Take 1 tablet (50 mcg total) by mouth daily.   MULTIPLE VITAMIN PO Take 1 tablet by mouth daily.   Restasis 0.05 % ophthalmic emulsion Generic drug: cycloSPORINE   rosuvastatin 10 MG tablet Commonly known as: CRESTOR Take 1 tablet (10 mg total) by mouth daily.   spironolactone 25 MG tablet Commonly known as: ALDACTONE Take 1 tablet (25 mg total) by mouth at bedtime.   torsemide 20 MG tablet Commonly known as: DEMADEX Take 1 tablet (20 mg total) by mouth daily.          OBJECTIVE:   PHYSICAL EXAM: VS: BP 126/80 (BP Location: Left Arm, Patient Position: Sitting, Cuff Size: Large)   Pulse 81   Ht 5' (1.524 m)   Wt 150 lb 8 oz (68.3 kg)   SpO2 95%   BMI 29.39 kg/m    EXAM: General: Pt appears well and is in NAD  Neck: General: Supple without adenopathy. Thyroid: Thyroid size normal.  No goiter or nodules appreciated.   Lungs: Clear with good BS bilat with no rales, rhonchi, or wheezes  Heart: Auscultation: RRR.  Abdomen: Normoactive bowel sounds, soft, nontender, without masses or organomegaly palpable  Extremities:  BL LE: No pretibial edema normal ROM and strength.  Mental Status: Judgment, insight: Intact Memory: Intact for recent and remote events Mood and affect: No depression, anxiety, or agitation     DATA REVIEWED:    Latest Reference Range & Units 12/13/21 10:20  Cortisol, Plasma ug/dL 12.1  TSH 0.35 - 5.50 uIU/mL 1.61      Latest Reference Range & Units 07/12/21 12:50  Sodium 135 - 145 mmol/L 140  Potassium 3.5 - 5.1 mmol/L 3.9  Chloride 98 - 111 mmol/L 102  CO2 22 - 32 mmol/L 30  Glucose 70 - 99 mg/dL 128 (H)  BUN 8 - 23 mg/dL 13  Creatinine 0.61 - 1.24 mg/dL 1.35 (H)  Calcium 8.9 - 10.3 mg/dL 8.9  Anion gap 5 - 15  8  GFR,  Estimated >60 mL/min 58 (L)     ASSESSMENT / PLAN / RECOMMENDATIONS:   Secondary Adrenal Insufficiency:  - Pt has been on hydrocortisone since 10/2019 with a low random serum cortisol of 0.4 ug/dL - ACTH was undetectable in 01/2020 - He had a medical alert bracelet but lost it, he was advised to obtain a new one - Recent BMP shows normal K and Na, stable GFR - Discussed sick day rule  - Prolactin and testosterone levels have been normal in the past -ACTH pending today, I would like to see if this remains suppressed -No changes to hydrocortisone dose at  this time   Medications Continue HC 10 mg, 1 tablet with Breakfast  Continue HC 5 mg, 1 tablet between 2-4 pm      2. Hypothyroidism:    - Pt is clinically euthyroid  - TSH and FT4 normal      Medications : Continue Levothyroxine 50 mcg daily     Follow-up 1 year    Signed electronically by: Mack Guise, MD  [facility] Endocrinology  [facility] [address] [place], [place] [address] Phone: [phone] FAX: [phone]      CC: Trey Sailors, [place] [address] [place] [address] Phone: [phone]  Fax: [phone]   Return to Endocrinology clinic as below: Future Appointments  Date Time Provider [place]  12/14/2022 10:10 AM Makai Dumond, Melanie Crazier, MD [id number] None

## 2021-12-16 LAB — ACTH: C206 ACTH: 5 pg/mL — ABNORMAL LOW (ref 6–50)

## 2021-12-29 ENCOUNTER — Other Ambulatory Visit (HOSPITAL_COMMUNITY): Payer: Self-pay | Admitting: Internal Medicine

## 2022-01-21 ENCOUNTER — Other Ambulatory Visit (HOSPITAL_COMMUNITY): Payer: Self-pay | Admitting: Family Medicine

## 2022-01-27 ENCOUNTER — Other Ambulatory Visit (HOSPITAL_COMMUNITY): Payer: Self-pay | Admitting: Internal Medicine

## 2022-02-10 ENCOUNTER — Other Ambulatory Visit: Payer: Self-pay

## 2022-02-10 ENCOUNTER — Encounter (HOSPITAL_COMMUNITY): Payer: Self-pay

## 2022-02-10 ENCOUNTER — Emergency Department (HOSPITAL_COMMUNITY)
Admission: EM | Admit: 2022-02-10 | Discharge: 2022-02-10 | Disposition: A | Discharge status: Home / Self Care | Payer: Medicare Other | Attending: Emergency Medicine | Admitting: Emergency Medicine

## 2022-02-10 ENCOUNTER — Emergency Department (HOSPITAL_COMMUNITY): Payer: Medicare Other

## 2022-02-10 DIAGNOSIS — I110 Hypertensive heart disease with heart failure: Secondary | ICD-10-CM | POA: Insufficient documentation

## 2022-02-10 DIAGNOSIS — K5900 Constipation, unspecified: Secondary | ICD-10-CM | POA: Insufficient documentation

## 2022-02-10 DIAGNOSIS — R1033 Periumbilical pain: Secondary | ICD-10-CM

## 2022-02-10 DIAGNOSIS — I509 Heart failure, unspecified: Secondary | ICD-10-CM | POA: Insufficient documentation

## 2022-02-10 DIAGNOSIS — K458 Other specified abdominal hernia without obstruction or gangrene: Secondary | ICD-10-CM | POA: Insufficient documentation

## 2022-02-10 DIAGNOSIS — R109 Unspecified abdominal pain: Secondary | ICD-10-CM | POA: Diagnosis present

## 2022-02-10 LAB — COMPREHENSIVE METABOLIC PANEL
ALT: 45 U/L — ABNORMAL HIGH (ref 0–44)
AST: 50 U/L — ABNORMAL HIGH (ref 15–41)
Albumin: 3.2 g/dL — ABNORMAL LOW (ref 3.5–5.0)
Alkaline Phosphatase: 74 U/L (ref 38–126)
Anion gap: 11 (ref 5–15)
BUN: 12 mg/dL (ref 8–23)
CO2: 27 mmol/L (ref 22–32)
Calcium: 8.6 mg/dL — ABNORMAL LOW (ref 8.9–10.3)
Chloride: 100 mmol/L (ref 98–111)
Creatinine, Ser: 1.37 mg/dL — ABNORMAL HIGH (ref 0.61–1.24)
GFR, Estimated: 57 mL/min — ABNORMAL LOW (ref >60)
Glucose, Bld: 108 mg/dL — ABNORMAL HIGH (ref 70–99)
Potassium: 3.9 mmol/L (ref 3.5–5.1)
Sodium: 138 mmol/L (ref 135–145)
Total Bilirubin: 0.5 mg/dL (ref 0.3–1.2)
Total Protein: 6.5 g/dL (ref 6.5–8.1)

## 2022-02-10 LAB — URINALYSIS, ROUTINE W REFLEX MICROSCOPIC
Bacteria, UA: NONE SEEN
Bilirubin Urine: NEGATIVE
Glucose, UA: >=500 mg/dL — AB
Hgb urine dipstick: NEGATIVE
Ketones, ur: NEGATIVE mg/dL
Leukocytes,Ua: NEGATIVE
Nitrite: NEGATIVE
Protein, ur: NEGATIVE mg/dL
Specific Gravity, Urine: 1.009 (ref 1.005–1.030)
pH: 7 (ref 5.0–8.0)

## 2022-02-10 LAB — CBC
HCT: 52.7 % — ABNORMAL HIGH (ref 39.0–52.0)
Hemoglobin: 16.8 g/dL (ref 13.0–17.0)
MCH: 24.8 pg — ABNORMAL LOW (ref 26.0–34.0)
MCHC: 31.9 g/dL (ref 30.0–36.0)
MCV: 77.7 fL — ABNORMAL LOW (ref 80.0–100.0)
Platelets: 249 10*3/uL (ref 150–400)
RBC: 6.78 MIL/uL — ABNORMAL HIGH (ref 4.22–5.81)
RDW: 18 % — ABNORMAL HIGH (ref 11.5–15.5)
WBC: 6 10*3/uL (ref 4.0–10.5)
nRBC: 0 % (ref 0.0–0.2)

## 2022-02-10 LAB — LIPASE, BLOOD: Lipase: 32 U/L (ref 11–51)

## 2022-02-10 MED ORDER — DICYCLOMINE HCL 20 MG PO TABS: 20.0000 mg | ORAL_TABLET | Freq: Three times a day (TID) | ORAL | 0 refills | Status: AC | PRN

## 2022-02-10 MED ORDER — SENNOSIDES-DOCUSATE SODIUM 8.6-50 MG PO TABS: 1.0000 | ORAL_TABLET | Freq: Every evening | ORAL | 0 refills | Status: AC | PRN

## 2022-02-10 MED ORDER — SODIUM CHLORIDE 0.9 % IV BOLUS
500.0000 mL | Freq: Once | INTRAVENOUS | Status: AC
  Administered 2022-02-10: 500 mL via INTRAVENOUS

## 2022-02-10 MED ORDER — IOHEXOL 350 MG/ML SOLN
75.0000 mL | Freq: Once | INTRAVENOUS | Status: AC | PRN
  Administered 2022-02-10: 75 mL via INTRAVENOUS

## 2022-02-10 MED ORDER — ONDANSETRON 4 MG PO TBDP: 4.0000 mg | ORAL_TABLET | Freq: Three times a day (TID) | ORAL | 0 refills | Status: AC | PRN

## 2022-02-10 NOTE — ED Provider Notes (Signed)
Emergency Department Provider Note   I have reviewed the triage vital signs and the nursing notes.   HISTORY  Chief Complaint Abdominal Pain   HPI Steven Frazier is a 67 y.o. male with a past history of hypertension, CHF, adrenal insufficiency presents to the emergency department with central abdominal pain.  He had some associated constipation.  No vomiting or diarrhea but some nausea.  Family at bedside note that he has a mass to the left flank which they have noticed also feeling "more soft" compared to normal. Patient is not having pain in this location. Has been told in the past by his PCP that this is a fatty deposit and they are monitoring clinically. No UTI symptoms. No CP or SOB.   Past Medical History:  Diagnosis Date   Adrenal insufficiency (HCC)    CHF (congestive heart failure) (HCC)    Heart failure (HCC)    Hypertension    Hyponatremia    Hypothyroidism    Shoulder pain     Review of Systems  Constitutional: No fever/chills Eyes: No visual changes. ENT: No sore throat. Cardiovascular: Denies chest pain. Respiratory: Denies shortness of breath. Gastrointestinal: Positive central abdominal pain. Positive nausea, no vomiting.  No diarrhea. Positive constipation. Genitourinary: Negative for dysuria. Musculoskeletal: Negative for back pain. Skin: Negative for rash. Neurological: Negative for headaches.  ____________________________________________   PHYSICAL EXAM:  VITAL SIGNS: ED Triage Vitals  Enc Vitals Group     BP 02/10/22 1216 135/85     Pulse Rate 02/10/22 1216 83     Resp 02/10/22 1216 18     Temp 02/10/22 1216 98.1 F (36.7 C)     Temp Source 02/10/22 1216 Oral     SpO2 02/10/22 1216 93 %     Weight 02/10/22 1215 150 lb (68 kg)     Height 02/10/22 1215 5' (1.524 m)   Constitutional: Alert and oriented. Well appearing and in no acute distress. Eyes: Conjunctivae are normal.  Head: Atraumatic. Nose: No  congestion/rhinnorhea. Mouth/Throat: Mucous membranes are moist.   Neck: No stridor.   Cardiovascular: Normal rate, regular rhythm. Good peripheral circulation. Grossly normal heart sounds.   Respiratory: Normal respiratory effort.  No retractions. Lungs CTAB. Gastrointestinal: Soft and nontender. Mild distention.  Soft, mobile area of swelling to the left flank.  No overlying skin changes or focal tenderness.  Musculoskeletal: No lower extremity tenderness nor edema. No gross deformities of extremities. Neurologic:  Normal speech and language. No gross focal neurologic deficits are appreciated.  Skin:  Skin is warm, dry and intact. No rash noted.  ____________________________________________   LABS (all labs ordered are listed, but only abnormal results are displayed)  Labs Reviewed  COMPREHENSIVE METABOLIC PANEL - Abnormal; Notable for the following components:      Result Value   Glucose, Bld 108 (*)    Creatinine, Ser 1.37 (*)    Calcium 8.6 (*)    Albumin 3.2 (*)    AST 50 (*)    ALT 45 (*)    GFR, Estimated 57 (*)    All other components within normal limits  CBC - Abnormal; Notable for the following components:   RBC 6.78 (*)    HCT 52.7 (*)    MCV 77.7 (*)    MCH 24.8 (*)    RDW 18.0 (*)    All other components within normal limits  URINALYSIS, ROUTINE W REFLEX MICROSCOPIC - Abnormal; Notable for the following components:   APPearance HAZY (*)  Glucose, UA >=500 (*)    All other components within normal limits  LIPASE, BLOOD   ____________________________________________  RADIOLOGY  CT ABDOMEN PELVIS W CONTRAST  Result Date: 02/10/2022 CLINICAL DATA:  Generalized abdominal pain since yesterday EXAM: CT ABDOMEN AND PELVIS WITH CONTRAST TECHNIQUE: Multidetector CT imaging of the abdomen and pelvis was performed using the standard protocol following bolus administration of intravenous contrast. RADIATION DOSE REDUCTION: This exam was performed according to the  departmental dose-optimization program which includes automated exposure control, adjustment of the mA and/or kV according to patient size and/or use of iterative reconstruction technique. CONTRAST:  19mL OMNIPAQUE IOHEXOL 350 MG/ML SOLN COMPARISON:  11/14/2019 FINDINGS: Lower chest: No acute pleural or parenchymal lung disease. Hepatobiliary: Relative hypertrophy left lobe liver, suggestive of underlying cirrhosis. There is heterogeneous decreased attenuation throughout the right lobe liver consistent with steatosis. No focal parenchymal abnormality. Gallbladder is unremarkable. No biliary duct dilation. Pancreas: Unremarkable. No pancreatic ductal dilatation or surrounding inflammatory changes. Spleen: Normal in size without focal abnormality. Adrenals/Urinary Tract: Stable simple left renal cyst does not require follow-up. Otherwise the kidneys enhance normally and symmetrically. No urinary tract calculi or obstructive uropathy. The adrenals and bladder are unremarkable. Stomach/Bowel: No bowel obstruction or ileus. Normal appendix right lower quadrant. No bowel wall thickening or inflammatory change. Vascular/Lymphatic: Aortic atherosclerosis. No enlarged abdominal or pelvic lymph nodes. Reproductive: Prostate is unremarkable. Other: No free fluid or free intraperitoneal gas. There are bilateral fat containing lumbar hernias which are stable since prior exam. No bowel herniation. No ventral hernia. Musculoskeletal: No acute or destructive bony lesions. Chronic left posterolateral ninth through eleventh rib fractures. Reconstructed images demonstrate no additional findings. IMPRESSION: 1. No acute intra-abdominal or intrapelvic process. 2. Heterogeneous hepatic steatosis, with persistent left lobe liver hypertrophy suggestive of underlying cirrhosis. 3. Bilateral fat containing lumbar hernia is, stable. No bowel herniation. 4.  Aortic Atherosclerosis (ICD10-I70.0). Electronically Signed   By: Randa Ngo M.D.    On: 02/10/2022 16:34    ____________________________________________   PROCEDURES  Procedure(s) performed:   Procedures  None ____________________________________________   INITIAL IMPRESSION / ASSESSMENT AND PLAN / ED COURSE  Pertinent labs & imaging results that were available during my care of the patient were reviewed by me and considered in my medical decision making (see chart for details).   This patient is Presenting for Evaluation of abdominal pain, which does require a range of treatment options, and is a complaint that involves a high risk of morbidity and mortality.  The Differential Diagnoses includes but is not exclusive to acute cholecystitis, intrathoracic causes for epigastric abdominal pain, gastritis, duodenitis, pancreatitis, small bowel or large bowel obstruction, abdominal aortic aneurysm, hernia, gastritis, etc.   Critical Interventions-    Medications  sodium chloride 0.9 % bolus 500 mL (0 mLs Intravenous Stopped 02/10/22 1822)  iohexol (OMNIPAQUE) 350 MG/ML injection 75 mL (75 mLs Intravenous Contrast Given 02/10/22 1622)    Reassessment after intervention: Symptoms improved.    I did obtain Additional Historical Information from family at bedside.   Clinical Laboratory Tests Ordered, included UA without evidence of infection.  No leukocytosis.  Creatinine of 1.37 similar to prior values.  Mild LFT elevation with normal bilirubin. Lipase normal.   Radiologic Tests Ordered, included CT abdomen/pelvis. I independently interpreted the images and agree with radiology interpretation.   Cardiac Monitor Tracing which shows NSR.    Social Determinants of Health Risk patient is a non-smoker.   Medical Decision Making: Summary:  Patient presents to the emergency  department for evaluation of central abdominal discomfort.  Minimally tender on exam.  No palpable anterior hernias.  Afebrile with non-SIRS vitals. CT pending.   Reevaluation with update and  discussion with patient and family at bedside.  We discussed the CT results.  His left flank mass is actually a fat-containing hernia.  We discussed this but he is not having pain in this location or tenderness on exam.  There is no bowel involvement.  No evidence of bowel obstruction.  He is feeling improved after small IV fluid bolus.  Plan for constipation and nausea management at home with close PCP follow-up.  Considered admission but ED workup is largely reassuring.  Patient appears stable for discharge.   Patient's presentation is most consistent with acute presentation with potential threat to life or bodily function.   Disposition: discharge  ____________________________________________  FINAL CLINICAL IMPRESSION(S) / ED DIAGNOSES  Final diagnoses:  Periumbilical abdominal pain  Constipation, unspecified constipation type  Lumbar hernia     NEW OUTPATIENT MEDICATIONS STARTED DURING THIS VISIT:  New Prescriptions   DICYCLOMINE (BENTYL) 20 MG TABLET    Take 1 tablet (20 mg total) by mouth 3 (three) times daily as needed for spasms (cramping).   ONDANSETRON (ZOFRAN-ODT) 4 MG DISINTEGRATING TABLET    Take 1 tablet (4 mg total) by mouth every 8 (eight) hours as needed for nausea or vomiting.   SENNA-DOCUSATE (SENOKOT-S) 8.6-50 MG TABLET    Take 1 tablet by mouth at bedtime as needed for mild constipation or moderate constipation.    Note:  This document was prepared using Dragon voice recognition software and may include unintentional dictation errors.  Nanda Quinton, MD, [facility] Emergency Medicine    Mecca Guitron, Wonda Olds, MD 02/10/22 [phone]

## 2022-02-10 NOTE — ED Notes (Signed)
Transported to CT 

## 2022-02-10 NOTE — ED Provider Triage Note (Signed)
Emergency Medicine Provider Triage Evaluation Note  Steven Frazier , a 67 y.o. male  was evaluated in triage.  Pt complains of periumbilical abdominal pain with nausea since yesterday.  He denies any vomiting, diarrhea, or constipation.  He reports he still been passing gas.  No fevers or cough or cold symptoms..  Review of Systems  Positive:  Negative:   Physical Exam  BP 135/85 (BP Location: Right Arm)   Pulse 83   Temp 98.1 F (36.7 C) (Oral)   Resp 18   Ht 5' (1.524 m)   Wt 68 kg   SpO2 93%   BMI 29.29 kg/m  Gen:   Awake, no distress   Resp:  Normal effort  MSK:   Moves extremities without difficulty  Other:  Periumbilical tenderness palpation.  Abdomen soft.  No guarding or rebound.  Nondistended.  Medical Decision Making  Medically screening exam initiated at 1:16 PM.  Appropriate orders placed.  Steven Frazier was informed that the remainder of the evaluation will be completed by another provider, this initial triage assessment does not replace that evaluation, and the importance of remaining in the ED until their evaluation is complete.  Labs and CT imaging ordered   Sherrell Puller, Hershal Coria 02/10/22 1318

## 2022-02-10 NOTE — ED Triage Notes (Signed)
Reports generalized abd pain that started yesterday .  Denies n/v/d reports last BM yesterday but was only a small amount.

## 2022-02-10 NOTE — Discharge Instructions (Signed)
You are seen in the emergency room today with abdominal discomfort.  I have called in several medicines to help with your constipation as well as nausea.  Please try and stay hydrated with plenty of fluids and follow closely with your primary care physician.   If you develop any new or suddenly worsening abdominal pain, vomiting, fevers you should return to the emergency department immediately for reevaluation.

## 2022-02-19 ENCOUNTER — Other Ambulatory Visit (HOSPITAL_COMMUNITY): Payer: Self-pay | Admitting: Family Medicine

## 2022-02-26 ENCOUNTER — Other Ambulatory Visit (HOSPITAL_COMMUNITY): Payer: Self-pay | Admitting: Internal Medicine

## 2022-02-28 ENCOUNTER — Other Ambulatory Visit (HOSPITAL_COMMUNITY): Payer: Self-pay

## 2022-02-28 MED ORDER — ENTRESTO 97-103 MG PO TABS
1.0000 | ORAL_TABLET | Freq: Two times a day (BID) | ORAL | 1 refills | Status: AC
  Filled 2022-02-28: qty 180, 90d supply, fill #0
  Filled 2022-06-15: qty 180, 90d supply, fill #1

## 2022-03-19 ENCOUNTER — Other Ambulatory Visit (HOSPITAL_COMMUNITY): Payer: Self-pay | Admitting: Family Medicine

## 2022-03-30 ENCOUNTER — Other Ambulatory Visit (HOSPITAL_COMMUNITY): Payer: Self-pay | Admitting: Internal Medicine

## 2022-04-17 ENCOUNTER — Other Ambulatory Visit (HOSPITAL_COMMUNITY): Payer: Self-pay | Admitting: Family Medicine

## 2022-04-30 ENCOUNTER — Other Ambulatory Visit (HOSPITAL_COMMUNITY): Payer: Self-pay | Admitting: Internal Medicine

## 2022-05-13 ENCOUNTER — Other Ambulatory Visit (HOSPITAL_COMMUNITY): Payer: Self-pay | Admitting: Family Medicine

## 2022-06-02 ENCOUNTER — Other Ambulatory Visit (HOSPITAL_COMMUNITY): Payer: Self-pay | Admitting: Internal Medicine

## 2022-06-26 ENCOUNTER — Other Ambulatory Visit (HOSPITAL_COMMUNITY): Payer: Self-pay | Admitting: Internal Medicine

## 2022-07-01 ENCOUNTER — Other Ambulatory Visit (HOSPITAL_COMMUNITY): Payer: Self-pay | Admitting: Internal Medicine

## 2022-07-20 ENCOUNTER — Encounter (HOSPITAL_COMMUNITY): Payer: Medicare Other | Admitting: Internal Medicine

## 2022-07-25 ENCOUNTER — Inpatient Hospital Stay (HOSPITAL_COMMUNITY)
Admission: RE | Admit: 2022-07-25 | Discharge: 2022-07-25 | Disposition: A | Discharge status: Home / Self Care | Payer: Medicare Other | Source: Ambulatory Visit | Attending: Internal Medicine | Admitting: Internal Medicine

## 2022-07-25 ENCOUNTER — Ambulatory Visit (HOSPITAL_COMMUNITY)
Admission: RE | Admit: 2022-07-25 | Discharge: 2022-07-25 | Disposition: A | Discharge status: Home / Self Care | Payer: Medicare Other | Source: Ambulatory Visit | Attending: Internal Medicine | Admitting: Internal Medicine

## 2022-07-25 ENCOUNTER — Encounter (HOSPITAL_COMMUNITY): Payer: Self-pay | Admitting: Internal Medicine

## 2022-07-25 ENCOUNTER — Other Ambulatory Visit (HOSPITAL_COMMUNITY): Payer: Self-pay | Admitting: Internal Medicine

## 2022-07-25 VITALS — BP 122/80 | HR 81 | Wt 151.6 lb

## 2022-07-25 DIAGNOSIS — I5022 Chronic systolic (congestive) heart failure: Secondary | ICD-10-CM | POA: Diagnosis not present

## 2022-07-25 DIAGNOSIS — I440 Atrioventricular block, first degree: Secondary | ICD-10-CM | POA: Insufficient documentation

## 2022-07-25 DIAGNOSIS — R002 Palpitations: Secondary | ICD-10-CM

## 2022-07-25 DIAGNOSIS — Z7984 Long term (current) use of oral hypoglycemic drugs: Secondary | ICD-10-CM | POA: Diagnosis not present

## 2022-07-25 DIAGNOSIS — I110 Hypertensive heart disease with heart failure: Secondary | ICD-10-CM | POA: Insufficient documentation

## 2022-07-25 DIAGNOSIS — Z79899 Other long term (current) drug therapy: Secondary | ICD-10-CM | POA: Insufficient documentation

## 2022-07-25 DIAGNOSIS — E2740 Unspecified adrenocortical insufficiency: Secondary | ICD-10-CM | POA: Insufficient documentation

## 2022-07-25 DIAGNOSIS — I428 Other cardiomyopathies: Secondary | ICD-10-CM | POA: Diagnosis not present

## 2022-07-25 DIAGNOSIS — E039 Hypothyroidism, unspecified: Secondary | ICD-10-CM | POA: Insufficient documentation

## 2022-07-25 LAB — BASIC METABOLIC PANEL
Anion gap: 9 (ref 5–15)
BUN: 10 mg/dL (ref 8–23)
CO2: 28 mmol/L (ref 22–32)
Calcium: 8.5 mg/dL — ABNORMAL LOW (ref 8.9–10.3)
Chloride: 100 mmol/L (ref 98–111)
Creatinine, Ser: 1.38 mg/dL — ABNORMAL HIGH (ref 0.61–1.24)
GFR, Estimated: 56 mL/min — ABNORMAL LOW (ref >60)
Glucose, Bld: 105 mg/dL — ABNORMAL HIGH (ref 70–99)
Potassium: 3.6 mmol/L (ref 3.5–5.1)
Sodium: 137 mmol/L (ref 135–145)

## 2022-07-25 LAB — BRAIN NATRIURETIC PEPTIDE: B Natriuretic Peptide: 672.9 pg/mL — ABNORMAL HIGH (ref 0.0–100.0)

## 2022-07-25 NOTE — Patient Instructions (Signed)
There has been no changes to your medications.  Labs done today, your results will be available in MyChart, we will contact you for abnormal readings.  Your provider has recommended that  you wear a Zio Patch for 14 days.  This monitor will record your heart rhythm for our review.  IF you have any symptoms while wearing the monitor please press the button.  If you have any issues with the patch or you notice a red or orange light on it please call the company at [phone].  Once you remove the patch please mail it back to the company as soon as possible so we can get the results.  [facility] [address] [place], [place] [address] [phone] Please take advantage of the free valet parking available at the [facility] and [employer] (Entrance C).  Proceed to the [facility] Radiology Department (First Floor) for check-in.   Magnetic resonance imaging (MRI) is a painless test that produces images of the inside of the body without using Xrays.  During an MRI, strong magnets and radio waves work together in a Data processing manager to form detailed images.   MRI images may provide more details about a medical condition than X-rays, CT scans, and ultrasounds can provide.  You may be given earphones to listen for instructions.  You may eat a light breakfast and take medications as ordered with the exception of furosemide, hydrochlorothiazide, or spironolactone(fluid pill, other). Please avoid stimulants for 12 hr prior to test. (Ie. Caffeine, nicotine, chocolate, or antihistamine medications)  If a contrast material will be used, an IV will be inserted into one of your veins. Contrast material will be injected into your IV. It will leave your body through your urine within a day. You may be told to drink plenty of fluids to help flush the contrast material out of your system.  You will be asked to remove all metal, including: Watch, jewelry, and other metal objects including  hearing aids, hair pieces and dentures. Also wearable glucose monitoring systems (ie. Freestyle Libre and Omnipods) (Braces and fillings normally are not a problem.)  TEST WILL TAKE APPROXIMATELY 1 HOUR  PLEASE NOTIFY SCHEDULING AT LEAST 24 HOURS IN ADVANCE IF YOU ARE UNABLE TO KEEP YOUR APPOINTMENT. [phone]  For more information and frequently asked questions, please visit our website : [email or web address]  Please call Rockwell Alexandria, cardiac imaging nurse navigator with any questions/concerns. Rockwell Alexandria RN Navigator Cardiac Imaging Larey Brick RN Navigator Cardiac Imaging [facility] [phone] Office   ONCE APPROVED BY YOUR INSURANCE, YOU WILL BE CALLED TO HAVE THE TEST ARRANGED.  Your physician recommends that you schedule a follow-up appointment in: 6 months ( December) ** please call the office in October to arrange your follow up appointment. **  If you have any questions or concerns before your next appointment please send [place] a message through [place] or call our office at [phone].    TO LEAVE A MESSAGE FOR THE NURSE SELECT OPTION 2, PLEASE LEAVE A MESSAGE INCLUDING: YOUR NAME DATE OF BIRTH CALL BACK NUMBER REASON FOR CALL**this is important as we prioritize the call backs  YOU WILL RECEIVE A CALL BACK THE SAME DAY AS LONG AS YOU CALL BEFORE 4:00 PM  At the [facility], you and your health needs are our priority. As part of our continuing mission to provide you with exceptional heart care, we have created designated Provider Care Teams. These Care Teams include your  primary Cardiologist (physician) and Advanced Practice Providers (APPs- Physician Assistants and Nurse Practitioners) who all work together to provide you with the care you need, when you need it.   You may see any of the following providers on your designated Care Team at your next follow up: Dr Arvilla Meres Dr Marca Ancona Dr.  Marcos Eke, NP Robbie Lis, [place] [facility] [place], [place] Brynda Peon, NP Karle Plumber, PharmD   Please be sure to bring in all your medications bottles to every appointment.    Thank you for choosing [facility]

## 2022-07-25 NOTE — Progress Notes (Signed)
[facility] Note    PCP: Norm Salt, PA PCP-Cardiologist: Dr. Gala Romney   Reason for Visit: Heart Failure   HPI: Steven Frazier is a 67 y.o. from [place] with systolic HF (EF 40-45%), HTN, and hypothyroidism.    Admitted 8/21 with hypervolemic hyponatremia in the setting of systolic HF with Na 117. Also noted to have unintentional significant weight loss (~20-25 pounds), low albumin, microcytic anemia and eosinophilia. Work-up including peripheral smear, HIV, hepatitis and parasitic screens negative.    Readmitted 10/21 with marked volume overload. Had [facility] showing severe NICM EF 20%, with normal coronaries and mildly elevated filling pressures w/ normal cardiac output. Found to have adrenal insuffiencey. Cortisol level 0.4. D/w endocrinology who recommended hydrocortisone 10 mg qam/ 5 mg pm + brain MRI w/ pituitary protocol. MRI was unremarkable.   cMRI 9/22 1. Mild LVE diffuse hypokinesis EF 44% improved from 11/17/19 measured at 37% then 2. Persistent mid myocardial LGE more impressivein antero and inferolateral walls compared to previous study including some mottled uptake in the septum and apex 3.  Small lateral pericardial effusion 4. Abnormal parametrics including elevated T1 mildly elevated T2 in regions with gadolinium uptake and elevated ECV ? Sarcoid vs infiltrative cardiomyopathy.  5.  Normal RV size and function   Today he returns for HF follow up with his son. Overall feeling fine. Walking regularly. No SOB, edema, orthopnea or PND. Remains on hydrocortisone. No dizziness or presyncope.   Echo 07/12/21 EF 50-55%, reviewed by Dr. Gala Romney   Cardiac Studies   Echo 6/23 EF 50-55% Echo 3/22 EF 45-50%   Echo 8/21: EF 40%. RV mildly reduced   R/LHC 10/21:  Ao = 123/72 (94) LV = 122/15 RA = 9 RV = 48/12 PA = 49/17 (31) PCW = 22 (v = 31) Fick cardiac output/index = 4.1/2.4 PVR = 2.2 WU  FA sat = 99% PA sat = 68%,  69% PAPi = 3.5  Assessment: 1. Severe NICM EF 20% 2. Normal coronaries 3. Mildly elevated filling pressures with normal CO  cMRI 10/21 1.  Normal LV size with EF 37%, diffuse hypokinesis. 2. Moderately dilated RV with EF 34%, moderately decreased systolic function. 3. Non-coronary pattern LGE. The LGE is extensive and mid-wall, suggesting possible prior myocarditis versus infiltrative disease. Lack of LV hypertrophy and ECV % < 40% makes cardiac amyloidosis less likely.  Review of systems complete and found to be negative unless listed in HPI.    Past Medical History:  Diagnosis Date   Adrenal insufficiency (HCC)    CHF (congestive heart failure) (HCC)    Heart failure (HCC)    Hypertension    Hyponatremia    Hypothyroidism    Shoulder pain    Current Outpatient Medications  Medication Sig Dispense Refill   aspirin EC 81 MG EC tablet Take 1 tablet (81 mg total) by mouth daily. Swallow whole. 30 tablet 11   dicyclomine (BENTYL) 20 MG tablet Take 1 tablet (20 mg total) by mouth 3 (three) times daily as needed for spasms (cramping). 20 tablet 0   FARXIGA 10 MG TABS tablet Take 1 tablet by mouth once daily 30 tablet 0   folic acid (FOLVITE) 1 MG tablet Take 1 tablet (1 mg total) by mouth daily. 30 tablet 0   hydrocortisone (CORTEF) 10 MG tablet Take 1 tablet (10 mg total) by mouth daily with breakfast. 100 tablet 3   hydrocortisone (CORTEF) 5 MG tablet Take 1 tablet (5 mg total)  by mouth every evening. He takes 5 pm between 2-4 pm and 10 mg with Breakfast daily 100 tablet 3   isosorbide-hydrALAZINE (BIDIL) 20-37.5 MG tablet TAKE 2 TABLETS BY MOUTH THREE TIMES DAILY 270 tablet 3   levothyroxine (SYNTHROID) 50 MCG tablet Take 1 tablet (50 mcg total) by mouth daily. 90 tablet 3   MULTIPLE VITAMIN PO Take 1 tablet by mouth daily.     ondansetron (ZOFRAN-ODT) 4 MG disintegrating tablet Take 1 tablet (4 mg total) by mouth every 8 (eight) hours as needed for nausea or vomiting. 20 tablet  0   RESTASIS 0.05 % ophthalmic emulsion      rosuvastatin (CRESTOR) 10 MG tablet Take 1 tablet (10 mg total) by mouth daily. 90 tablet 3   sacubitril-valsartan (ENTRESTO) 97-103 MG Take 1 tablet by mouth 2 (two) times daily. 180 tablet 1   senna-docusate (SENOKOT-S) 8.6-50 MG tablet Take 1 tablet by mouth at bedtime as needed for mild constipation or moderate constipation. 20 tablet 0   spironolactone (ALDACTONE) 25 MG tablet TAKE 1 TABLET BY MOUTH AT BEDTIME 90 tablet 3   torsemide (DEMADEX) 20 MG tablet Take 1 tablet by mouth once daily 30 tablet 3   No current facility-administered medications for this encounter.   No Known Allergies  Social History   Socioeconomic History   Marital status: Married    Spouse name: Not on file   Number of children: 4   Years of education: Not on file   Highest education level: Not on file  Occupational History   Not on file  Tobacco Use   Smoking status: Never   Smokeless tobacco: Never  Vaping Use   Vaping Use: Never used  Substance and Sexual Activity   Alcohol use: Not Currently   Drug use: Never   Sexual activity: Not on file  Other Topics Concern   Not on file  Social History Narrative   Not on file   Social Determinants of Health   Financial Resource Strain: Not on file  Food Insecurity: No Food Insecurity (11/17/2019)   Hunger Vital Sign    Worried About Running Out of Food in the Last Year: Never true    Ran Out of Food in the Last Year: Never true  Transportation Needs: No Transportation Needs (11/17/2019)   PRAPARE - Administrator, Civil Service (Medical): No    Lack of Transportation (Non-Medical): No  Physical Activity: Not on file  Stress: Not on file  Social Connections: Not on file  Intimate Partner Violence: Not on file    History reviewed. No pertinent family history.  BP 122/80   Pulse 81   Wt 68.8 kg (151 lb 9.6 oz)   SpO2 95%   BMI 29.61 kg/m   Wt Readings from Last 3 Encounters:   07/25/22 68.8 kg (151 lb 9.6 oz)  02/10/22 68 kg (150 lb)  12/13/21 68.3 kg (150 lb 8 oz)   PHYSICAL EXAM: General:  Well appearing. No resp difficulty HEENT: normal Neck: supple. no JVD. Carotids 2+ bilat; no bruits. No lymphadenopathy or thryomegaly appreciated. Cor: PMI nondisplaced. Regular rate & rhythm. No rubs, gallops or murmurs. Lungs: clear Abdomen: soft, nontender, nondistended. No hepatosplenomegaly. No bruits or masses. Good bowel sounds. Extremities: no cyanosis, clubbing, rash, edema Neuro: alert & orientedx3, cranial nerves grossly intact. moves all 4 extremities w/o difficulty. Affect pleasant  ECG NS  82 IVCD marked 1 AVB ( ) Personally reviewed  ASSESSMENT & PLAN:  1.  Chronic  Systolic Heart Failure - Echo 1/61 showed EF 40%. RV mildly reduced ---> ECHO 03/2020 EF 45-50%  - R/LHC 10/21 showed severe NICM EF 20%, with normal coronaries and mildly elevated filling pressures w/ normal cardiac output. - cMRI 10/21 suggestive of previous myocarditis vs infiltrative. EF 37% - PYP visual and quantitative assessment (grade 1 and H/CL = 1.18) are equivocal for transthyretin amyloidosis => probably not TTR amyloidosis. - auto-immune serologies negative. ESR 5, SPEP/serum light chains negative - cMRI 9/22 EF 44%. Persistent mid myocardial LGE more impressivein antero and inferolateral walls compared to previous study including some mottled uptake in the septum and apex Abnormal parametrics including elevated T1 mildly elevated T2 in regions with gadolinium uptake and elevated ECV ? Sarcoid vs infiltrative cardiomyopathy. Normal RV size and function - Etiology of HF remains unclear. ? secondary adrenocortical insufficiency as etiology of HF. Serum cortisol level was markedly low at 0.4 (10/21)>>now on hydrocortisone  cMRI concerning for possible previous myocarditis vs sarcoid. 1AVB raises concern for infiltrative process. EF has recovered with treatment of adrenal insufficiency.   - Echo 07/12/21 showed EF 50-55%. - Will repeat cMRI to further evaluate. Can consider PET as needed  - Stable NYHA I Volume status stable.  - Continue torsemide 20 mg daily  - Continue spiro 25 daily - Continue Farxiga 10 mg daily - Continue Entresto 97-103 mg bid.    - Continue Bidil 2 tablet tid  - Have avoided ? blocker w/ marked 1st degree AVB. - Labs today  2. Adrenal Insuffiencey  - Serum Cortisol markedly low 0.4 10/21  - brain MRI w/ pituitary protocol unremarkable. - C/w hydrocortisone  - Followed by  Endocrinology.  - doing well  3. HTN  - Blood pressure well-controlled.  4. Marked 1 AVB - > . Asymptomatic.  - will place montior to look for any pauses - will likely need PPM at some point - avoid AVN blockers - Repeating cMRI as above  Arvilla Meres, MD  10:36 AM

## 2022-07-25 NOTE — Progress Notes (Signed)
Zio patch placed onto patient.  All instructions and information reviewed with patient, they verbalize understanding with no questions. 

## 2022-08-04 ENCOUNTER — Other Ambulatory Visit (HOSPITAL_COMMUNITY): Payer: Self-pay | Admitting: Internal Medicine

## 2022-08-22 ENCOUNTER — Other Ambulatory Visit (HOSPITAL_COMMUNITY): Payer: Self-pay | Admitting: Family Medicine

## 2022-08-23 NOTE — Addendum Note (Signed)
Encounter addended by: Crissie Figures, RN on: 08/23/2022 10:46 AM  Actions taken: Imaging Exam ended

## 2022-09-07 ENCOUNTER — Telehealth (HOSPITAL_COMMUNITY): Payer: Self-pay | Admitting: Emergency Medicine

## 2022-09-07 NOTE — Telephone Encounter (Signed)
Reaching out to patient to offer assistance regarding upcoming cardiac imaging study; pt verbalizes understanding of appt date/time, parking situation and where to check in, pre-test NPO status and medications ordered, and verified current allergies; name and call back number provided for further questions should they arise Rockwell Alexandria RN Navigator Cardiac Imaging [facility] [phone] office [phone] cell  Spoke to son, patient has had a couple CMRs before eith no issues

## 2022-09-10 ENCOUNTER — Ambulatory Visit (HOSPITAL_COMMUNITY)
Admission: RE | Admit: 2022-09-10 | Discharge: 2022-09-10 | Disposition: A | Discharge status: Home / Self Care | Payer: Medicare Other | Source: Ambulatory Visit | Attending: Internal Medicine | Admitting: Internal Medicine

## 2022-09-10 ENCOUNTER — Other Ambulatory Visit (HOSPITAL_COMMUNITY): Payer: Self-pay | Admitting: Internal Medicine

## 2022-09-10 DIAGNOSIS — I5022 Chronic systolic (congestive) heart failure: Secondary | ICD-10-CM

## 2022-10-09 ENCOUNTER — Other Ambulatory Visit (HOSPITAL_COMMUNITY): Payer: Medicare Other

## 2022-12-14 ENCOUNTER — Ambulatory Visit: Payer: Medicare Other | Admitting: Internal Medicine

## 2023-06-30 DEATH — deceased
